# Patient Record
Sex: Male | Born: 1955 | Race: White | Hispanic: No | Marital: Married | Smoking: Never smoker
Health system: Southern US, Community
[De-identification: ages and names within clinical notes are randomized; demographics above are authoritative.]

## PROBLEM LIST (undated history)

## (undated) DIAGNOSIS — E781 Pure hyperglyceridemia: Secondary | ICD-10-CM

## (undated) DIAGNOSIS — Z8614 Personal history of Methicillin resistant Staphylococcus aureus infection: Secondary | ICD-10-CM

## (undated) DIAGNOSIS — D1801 Hemangioma of skin and subcutaneous tissue: Secondary | ICD-10-CM

## (undated) DIAGNOSIS — G459 Transient cerebral ischemic attack, unspecified: Secondary | ICD-10-CM

## (undated) DIAGNOSIS — C4491 Basal cell carcinoma of skin, unspecified: Secondary | ICD-10-CM

## (undated) DIAGNOSIS — M539 Dorsopathy, unspecified: Secondary | ICD-10-CM

## (undated) DIAGNOSIS — I10 Essential (primary) hypertension: Secondary | ICD-10-CM

## (undated) DIAGNOSIS — Z0389 Encounter for observation for other suspected diseases and conditions ruled out: Secondary | ICD-10-CM

## (undated) DIAGNOSIS — E785 Hyperlipidemia, unspecified: Secondary | ICD-10-CM

## (undated) HISTORY — DX: Hemangioma of skin and subcutaneous tissue: D18.01

## (undated) HISTORY — DX: Hyperlipidemia, unspecified: E78.5

## (undated) HISTORY — DX: Encounter for observation for other suspected diseases and conditions ruled out: Z03.89

## (undated) HISTORY — DX: Pure hyperglyceridemia: E78.1

## (undated) HISTORY — PX: BACK SURGERY: SHX140

## (undated) HISTORY — DX: Basal cell carcinoma of skin, unspecified: C44.91

## (undated) HISTORY — PX: HAND SURGERY: SHX662

## (undated) HISTORY — DX: Personal history of Methicillin resistant Staphylococcus aureus infection: Z86.14

---

## 2002-06-22 ENCOUNTER — Encounter: Payer: Self-pay | Admitting: *Deleted

## 2002-06-22 ENCOUNTER — Encounter: Payer: Self-pay | Admitting: Orthopedic Surgery

## 2002-06-22 ENCOUNTER — Emergency Department (HOSPITAL_COMMUNITY): Admission: EM | Admit: 2002-06-22 | Discharge: 2002-06-22 | Payer: Self-pay | Admitting: Emergency Medicine

## 2006-12-16 ENCOUNTER — Ambulatory Visit: Payer: Self-pay | Admitting: Cardiology

## 2007-01-07 ENCOUNTER — Ambulatory Visit (HOSPITAL_COMMUNITY): Admission: RE | Admit: 2007-01-07 | Discharge: 2007-01-07 | Payer: Self-pay | Admitting: Cardiovascular Disease

## 2007-01-07 ENCOUNTER — Ambulatory Visit: Payer: Self-pay | Admitting: Cardiovascular Disease

## 2007-01-07 IMAGING — CT CT HEART WO/W CTA ONLY W/ CA
1 of 5 series · 13 of 20 positions shown, 17 images · non-contrast
Comparison: none

Addendum BeginsOriginal report by Dr. DIZZLE.  Following addendum by Dr. DIZZLE on [DATE]: 
 OVER-READ INTERPRETATION - CHEST CT:
 The following report provided by [REDACTED] is an over-read interpretation of the non-cardiac portion of this coronary CTA study.  This does not include interpretation of cardiac or coronary anatomy or pathology.
CLINICAL DATA: 50-year-old patient with strong family history for coronary disease.  Previous atypical chest pain. 
 CARDIAC CTA:
PROTOCOL: The patient was scanned on the Siemens Sensation 64-slice scanner.  Gantry rotation speed was 330 milliseconds.  Collimation was [DATE] mm with a reconstruction overlap of 0.4 mm.  5 mg of IV Lopressor was given as well as sublingual nitroglycerin.  Average heart rate during the scan was 54 BPM.  After AP and lateral topograms, the patient had 3-mm noncontrast calcium scoring performed axially.  Score was calculated using the Agatston method.  20 cc of contrast were then given for timing bolus with the region of interest in the ascending aorta.  80 cc of contrast were then given for coronary CTA.  3D data set was sent to a [HOSPITAL] workstation for imaging using MPR, MIP, and VRT modes.

[Series 11: 70% only · axial · 0.45mm/px · z∈[-282,-161]mm · 13 of 354 slices shown, 17 images]
[im 26/354  vessel]
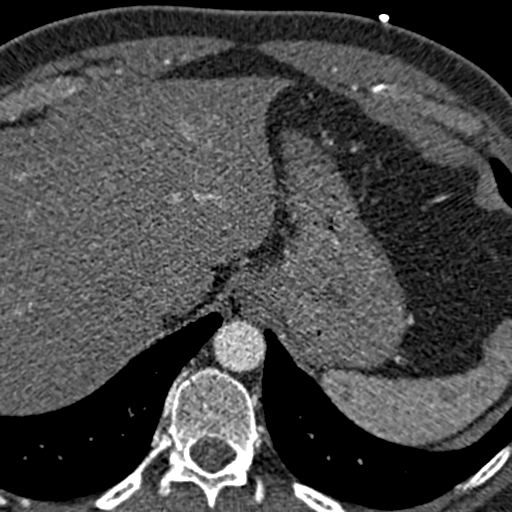
[im 26/354  lung]
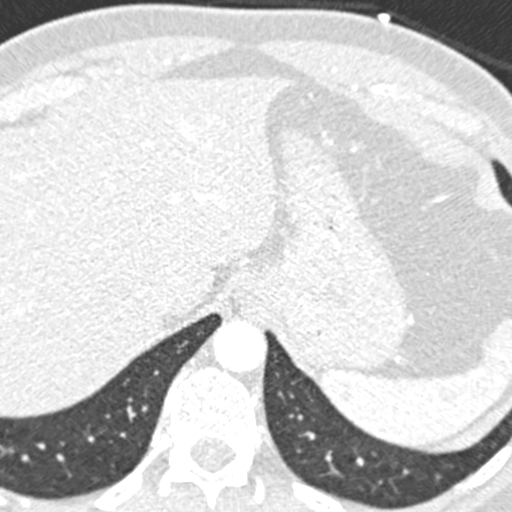
[im 51/354  vessel]
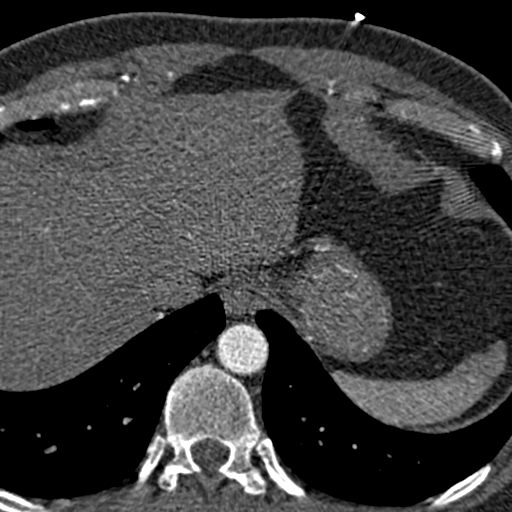
[im 76/354  vessel]
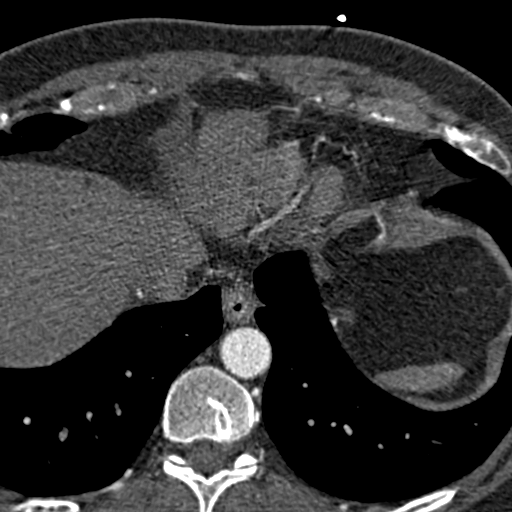
[im 101/354  vessel]
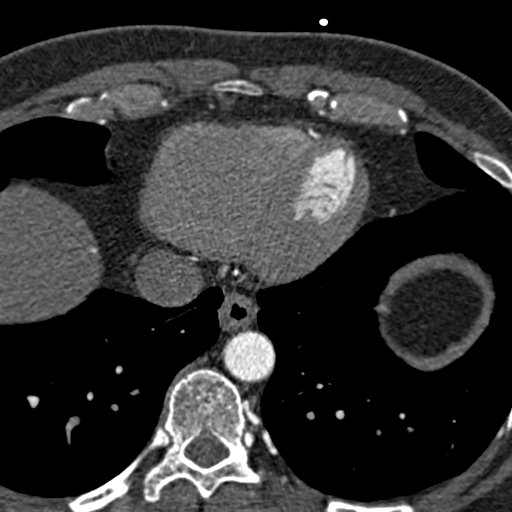
[im 127/354  vessel]
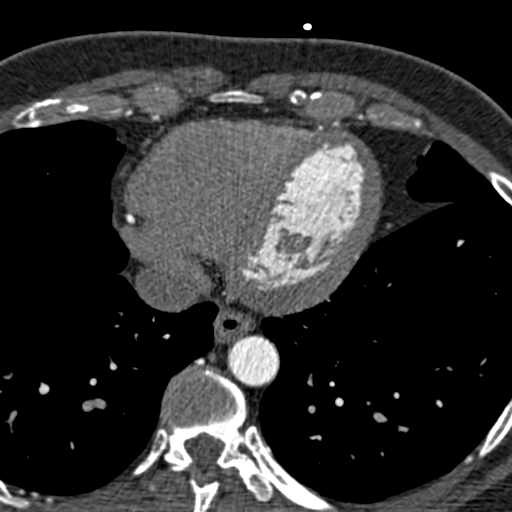
[im 127/354  lung]
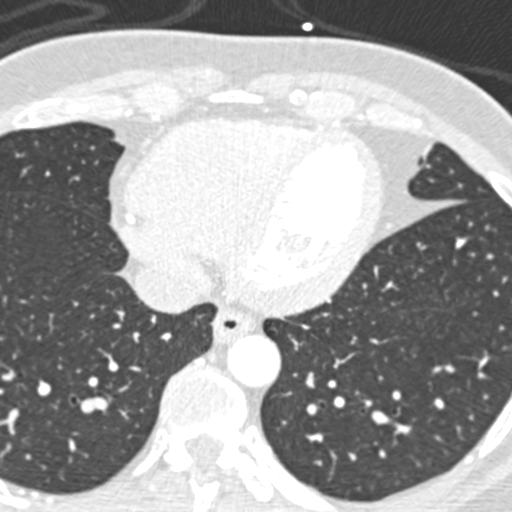
[im 152/354  vessel]
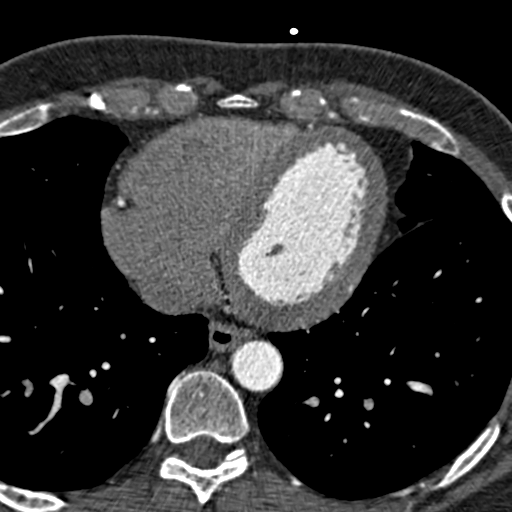
[im 177/354  vessel]
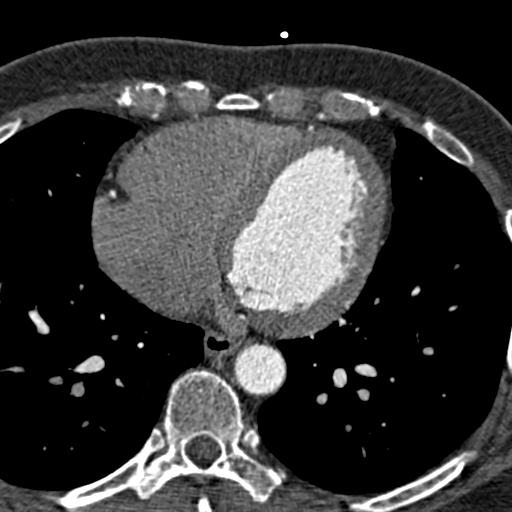
[im 202/354  vessel]
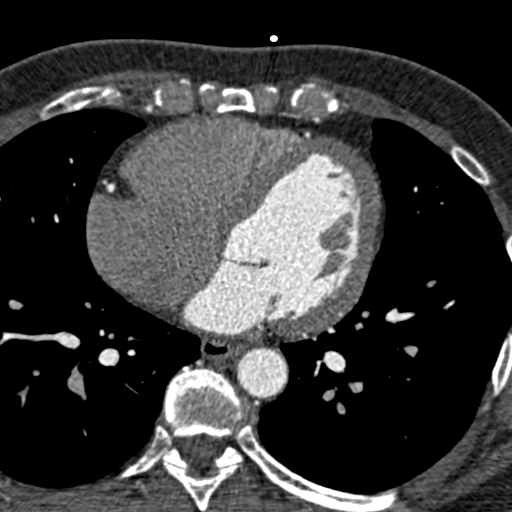
[im 227/354  vessel]
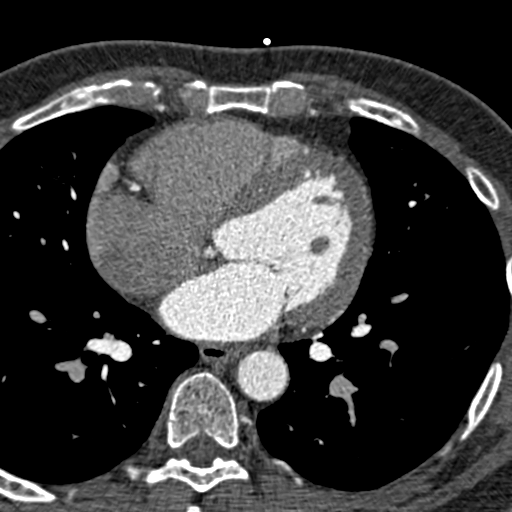
[im 227/354  lung]
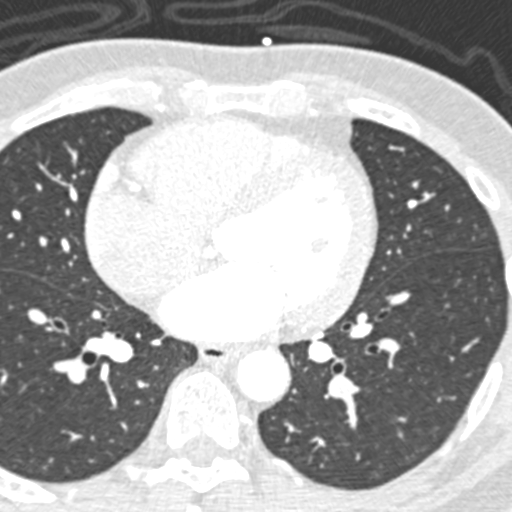
[im 253/354  vessel]
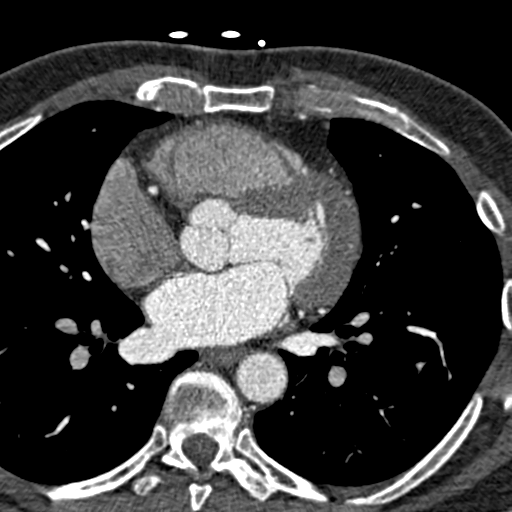
[im 278/354  vessel]
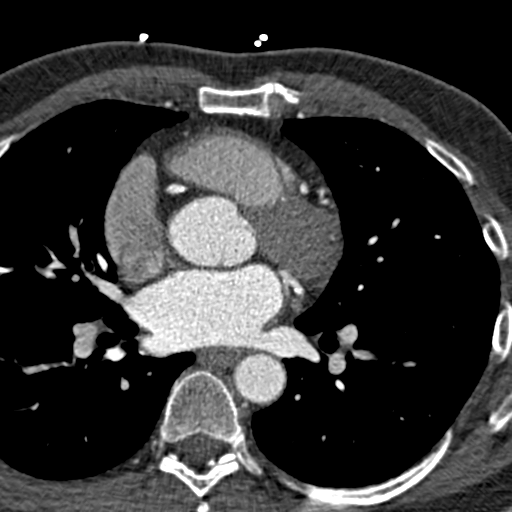
[im 303/354  vessel]
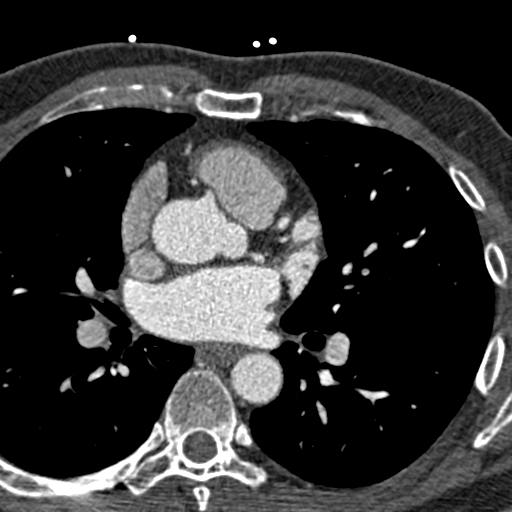
[im 328/354  vessel]
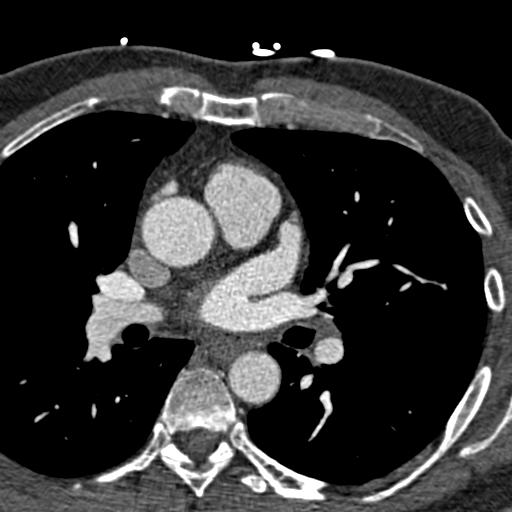
[im 328/354  lung]
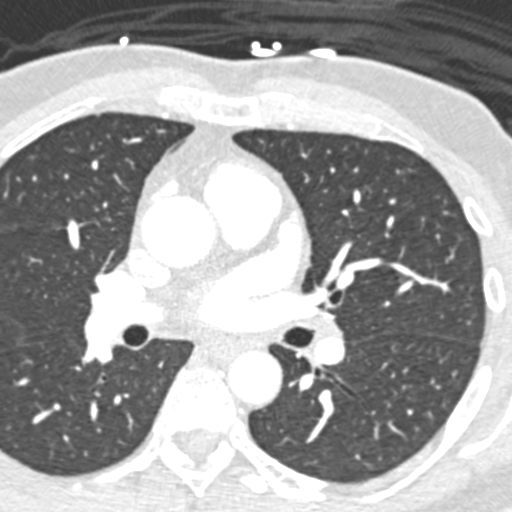

[13 of 20 positions shown; findings below may reference images not displayed]

FINDINGS: There is no lymphadenopathy within the visualized portions of the chest.  The visualized lungs are unremarkable.
IMPRESSION: No acute or significant findings in the background visualized anatomy of the chest. 

 Addendum Ends
FINDINGS: Bone, soft tissue, and lung windows were reviewed.  These will also be looked at by Dr. DIZZLE from [REDACTED].  See separate dictation.  There were no obvious abnormalities. 
 NONCORONARY CARDIAC FINDINGS:  All four cardiac chambers were normal in size.  There was no ASD or VSD.  There was no pericardial effusion.  The aortic valve was trileaflet and structurally normal.  Mitral and tricuspid valves appeared structurally normal.  Left ventricular cavity size and function were normal.  The quantitative ejection fraction was 67%.  There were no regional wall motion abnormalities. 
 CORONARY CTA:  The patient is right coronary artery dominant.  There were no congenital anomalies.  
 The left main coronary artery was normal and arose from the left cusp.  There was a small area of calcification superiorly but no stenoses.  The left anterior descending artery had moderate calcification in the mid vessel, less than 50% calcific stenosis in the mid LAD.  The first diagonal branch was small without significant disease.  The second diagonal branch was large and bifurcating without significant disease.  The distal LAD was normal. 
 The circumflex coronary artery had a small amount of calcification in the mid vessel. There was no significant stenosis.  The first obtuse marginal branch was large and branching without significant disease.  The second obtuse marginal branch was without disease.  The distal aspect of the circ was poorly visualized. 
 The right coronary artery was dominant.  There was a conus branch and one small RV branch seen.  There was moderate calcification in the proximal and mid RCA.  There was less than 30% calcific disease in the proximal and mid RCA.  Distal RCA was normal.  There was a PDA and two small posterolateral branches, which were normal. 
 The patient's calcium score was equal to 194.  The majority of the calcium was in the mid LAD as well as the proximal and mid right.
IMPRESSION: 1.  Calcium score of 194. 
 2.  Normal left ventricle with EF of 67%. 
 3.  No critical coronary artery disease but definite evidence of coronary atherosclerosis.  The patient has less than 50% calcific lesion in the mid LAD and less than 30% calcific disease in the proximal and mid right. 
 4.  No significant noncardiac findings. 
 5.  Given the patient's strong family history and evidence for coronary calcification, aggressive risk factor modification is indicated and probable follow-up perfusion study in two years. 
 Study to be billed by [facility] as [3P] and [3P] T-codes.

## 2007-03-29 ENCOUNTER — Ambulatory Visit: Payer: Self-pay | Admitting: Cardiology

## 2007-11-17 ENCOUNTER — Ambulatory Visit: Payer: Self-pay | Admitting: Cardiology

## 2008-11-14 ENCOUNTER — Ambulatory Visit: Payer: Self-pay | Admitting: Cardiology

## 2008-12-12 ENCOUNTER — Encounter: Payer: Self-pay | Admitting: Cardiology

## 2009-02-26 DIAGNOSIS — I2510 Atherosclerotic heart disease of native coronary artery without angina pectoris: Secondary | ICD-10-CM | POA: Insufficient documentation

## 2009-02-26 DIAGNOSIS — I10 Essential (primary) hypertension: Secondary | ICD-10-CM | POA: Insufficient documentation

## 2009-06-28 ENCOUNTER — Encounter: Payer: Self-pay | Admitting: Cardiology

## 2009-07-08 ENCOUNTER — Encounter: Payer: Self-pay | Admitting: Infectious Diseases

## 2009-07-08 DIAGNOSIS — L02519 Cutaneous abscess of unspecified hand: Secondary | ICD-10-CM | POA: Insufficient documentation

## 2009-07-08 DIAGNOSIS — L03119 Cellulitis of unspecified part of limb: Secondary | ICD-10-CM | POA: Insufficient documentation

## 2009-07-12 ENCOUNTER — Encounter (INDEPENDENT_AMBULATORY_CARE_PROVIDER_SITE_OTHER): Payer: Self-pay | Admitting: *Deleted

## 2009-07-12 DIAGNOSIS — L02419 Cutaneous abscess of limb, unspecified: Secondary | ICD-10-CM | POA: Insufficient documentation

## 2009-07-12 DIAGNOSIS — M26609 Unspecified temporomandibular joint disorder, unspecified side: Secondary | ICD-10-CM | POA: Insufficient documentation

## 2009-07-12 DIAGNOSIS — J309 Allergic rhinitis, unspecified: Secondary | ICD-10-CM | POA: Insufficient documentation

## 2009-07-12 DIAGNOSIS — L03119 Cellulitis of unspecified part of limb: Secondary | ICD-10-CM | POA: Insufficient documentation

## 2009-07-12 DIAGNOSIS — N419 Inflammatory disease of prostate, unspecified: Secondary | ICD-10-CM | POA: Insufficient documentation

## 2009-07-12 DIAGNOSIS — J45901 Unspecified asthma with (acute) exacerbation: Secondary | ICD-10-CM | POA: Insufficient documentation

## 2009-07-18 ENCOUNTER — Ambulatory Visit: Payer: Self-pay | Admitting: Infectious Diseases

## 2009-07-18 DIAGNOSIS — A4902 Methicillin resistant Staphylococcus aureus infection, unspecified site: Secondary | ICD-10-CM | POA: Insufficient documentation

## 2009-08-23 ENCOUNTER — Encounter: Payer: Self-pay | Admitting: Cardiology

## 2009-08-28 ENCOUNTER — Encounter: Payer: Self-pay | Admitting: Cardiology

## 2009-08-28 ENCOUNTER — Encounter: Payer: Self-pay | Admitting: Infectious Diseases

## 2009-09-19 ENCOUNTER — Ambulatory Visit: Payer: Self-pay | Admitting: Infectious Diseases

## 2010-03-12 ENCOUNTER — Encounter: Payer: Self-pay | Admitting: Infectious Diseases

## 2010-03-12 ENCOUNTER — Encounter: Payer: Self-pay | Admitting: Cardiology

## 2010-04-15 ENCOUNTER — Ambulatory Visit: Payer: Self-pay | Admitting: Cardiology

## 2010-04-15 ENCOUNTER — Encounter (INDEPENDENT_AMBULATORY_CARE_PROVIDER_SITE_OTHER): Payer: Self-pay | Admitting: *Deleted

## 2010-05-07 ENCOUNTER — Ambulatory Visit: Payer: Self-pay | Admitting: Cardiology

## 2010-05-12 ENCOUNTER — Telehealth: Payer: Self-pay | Admitting: Cardiology

## 2010-07-10 NOTE — Assessment & Plan Note (Signed)
Summary: 1 YR FU PER JUNE REMINDER-SRS   Visit Type:  Follow-up Primary Provider:  Sherril Croon   History of Present Illness: the patient is a 55 year old male with no prior history of significant coronary artery disease. Had a cardiac CT scan done several years ago which showed mild plaquing. There were no obstructive lesions. The patient is treated with statins e. His recent labs showed cholesterol 156 triglycerides of 146 LDL of 81 and HDL of 46. The patient still working out weights and cardio 5 times a week. He occasional has chest pressure but this is very short-lived and is nonexertional. The patient's blood pressure did have slightly increased and is requesting to increase his hydrochlorothiazide.  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  From a cardiovascular standpoint is otherwise stable and asymptomatic. The patient is requesting a screening stress test.  Preventive Screening-Counseling & Management  Alcohol-Tobacco     Smoking Status: never  Current Medications (verified): 1)  Aspir-Low 81 Mg Tbec (Aspirin) .... Take 1 Tablet By Mouth Once A Day 2)  Hydrochlorothiazide 25 Mg Tabs (Hydrochlorothiazide) .... Take 1/2 Tab (12.5mg ) Daily 3)  Lipitor 80 Mg Tabs (Atorvastatin Calcium) .... Take 1/2 Tablet By Mouth Once A Day 4)  Lotrel 5-10 Mg Caps (Amlodipine Besy-Benazepril Hcl) .... Take 1 Tablet By Mouth Once A Day 5)  Multivitamins  Tabs (Multiple Vitamin) .... Take 1 Tablet By Mouth Once A Day 6)  Tenormin 50 Mg Tabs (Atenolol) .... Take 1/4 Tablet By Mouth Once A Day 7)  Zetia 10 Mg Tabs (Ezetimibe) .... Take 1 Tablet By Mouth Once A Day 8)  Jalyn 0.5-0.4 Mg Caps (Dutasteride-Tamsulosin Hcl) .... Take 1 Tablet By Mouth Once A Day  Allergies (verified): 1)  ! Sudafed (Pseudoephedrine Hcl) 2)  ! * Antihistamines 3)  ! Biaxin  Comments:  Nurse/Medical Assistant: The patient's  medication list and allergies were reviewed with the patient and were updated in the Medication and Allergy Lists.  Past History:  Past Medical History: Last updated: 02/26/2009 HYPERTENSION, UNSPECIFIED (ICD-401.9) CAD, NATIVE VESSEL (ICD-414.01)    Past Surgical History: Last updated: 02/26/2009 Back Surgery  Family History: Last updated: 02/26/2009 Heart disease  Social History: Last updated: 02/26/2009 Full Time Married  Tobacco Use - No.  Alcohol Use - yes Regular Exercise - yes Drug Use - no  Risk Factors: Alcohol Use: 1 (09/19/2009) Caffeine Use: sodas (09/19/2009) Exercise: yes (09/19/2009)  Risk Factors: Smoking Status: never (04/15/2010)  Review of Systems  The patient denies fatigue, malaise, fever, weight gain/loss, vision loss, decreased hearing, hoarseness, chest pain, palpitations, shortness of breath, prolonged cough, wheezing, sleep apnea, coughing up blood, abdominal pain, blood in stool, nausea, vomiting, diarrhea, heartburn, incontinence, blood in urine, muscle weakness, joint pain, leg swelling, rash, skin lesions, headache, fainting, dizziness, depression, anxiety, enlarged lymph nodes, easy bruising or bleeding, and environmental allergies.    Vital Signs:  Patient profile:   55 year old male Height:      70 inches Weight:      177 pounds Pulse rate:   64 / minute BP sitting:   138 / 84  (left arm) Cuff size:   regular  Vitals Entered By: Carlye Grippe (April 15, 2010 8:41 AM)  Physical Exam  Additional Exam:  General: Well-developed, well-nourished in no distress head: Normocephalic and atraumatic eyes PERRLA/EOMI intact, conjunctiva and lids normal nose: No deformity or lesions mouth normal dentition, normal posterior  pharynx neck: Supple, no JVD.  No masses, thyromegaly or abnormal cervical nodes lungs: Normal breath sounds bilaterally without wheezing.  Normal percussion heart: regular rate and rhythm with normal S1 and S2, no  S3 or S4.  PMI is normal.  No pathological murmurs abdomen: Normal bowel sounds, abdomen is soft and nontender without masses, organomegaly or hernias noted.  No hepatosplenomegaly musculoskeletal: Back normal, normal gait muscle strength and tone normal pulsus: Pulse is normal in all 4 extremities Extremities: No peripheral pitting edema neurologic: Alert and oriented x 3 skin: Intact without lesions or rashes cervical nodes: No significant adenopathy psychologic: Normal affect   EKG  Procedure date:  04/15/2010  Findings:      sinus bradycardia.LVH. Heart rate 59 beats per minute.  Impression & Recommendations:  Problem # 1:  SCREENING OTHER&UNSPEC CARDIOVASCULAR CONDITIONS (ICD-V81.2) the patient has a strong family history of coronary artery disease and has some coronary plaquing by prior CT scan. We will schedule him for a stress echocardiogram. 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  Problem # 3:  HYPERTENSION, UNSPECIFIED (ICD-401.9) blood pressure is somewhat increased. We will increase his hydrochlorothiazide to 12.5 glucose p.o. q. daily. His updated medication list for this problem includes:    Aspir-low 81 Mg Tbec (Aspirin) .Marland Kitchen... Take 1 tablet by mouth once a day    Hydrochlorothiazide 25 Mg Tabs (Hydrochlorothiazide) .Marland Kitchen... Take 1/2 tab (12.5mg ) daily    Lotrel 5-10 Mg Caps (Amlodipine besy-benazepril hcl) .Marland Kitchen... Take 1 tablet by mouth once a day    Tenormin 50 Mg Tabs (Atenolol) .Marland Kitchen... Take 1/4 tablet by mouth once a day  Other Orders: EKG w/ Interpretation (93000)  Patient Instructions: 1)  Stress Echo 2)  Increase HCTZ to 12.5mg  daily 3)  Follow up in  1 year  Prescriptions: HYDROCHLOROTHIAZIDE 25 MG TABS (HYDROCHLOROTHIAZIDE) take 1/2 tab (12.5mg ) daily  #15 x 6   Entered by:   Hoover Brunette, LPN   Authorized by:   Lewayne Bunting, MD, [facility]   Signed by:    Hoover Brunette, LPN on 81/19/1478   Method used:   Electronically to        [facility]* (retail)       [address]       [place], [place]  [address]       Ph: [phone]       Fax: [phone]   RxID:   [id number]

## 2010-07-10 NOTE — Consult Note (Signed)
Summary: [facility]  [facility]   Imported By: Florinda Marker 03/31/2010 12:18:05  _____________________________________________________________________  External Attachment:    Type:   Image     Comment:   External Document

## 2010-07-10 NOTE — Progress Notes (Signed)
Summary: PHONE:  TEST RESULTS  Phone Note Call from Patient Call back at Home Phone [phone] Call back at cell:  [phone]   Caller: Patient Call For: DR. Andee Lineman Reason for Call: Lab or Test Results Summary of Call: [name] [name] called today requesting to his test results.  I spoke with [name] and she sugguested sending you a flag on this phone call. Thanks. Initial call taken by: Zachary George,  May 12, 2010 11:34 AM  Follow-up for Phone Call        completely normal stress echo please notify patient Follow-up by: Lewayne Bunting, MD, [facility],  May 14, 2010 1:56 PM     Appended Document: PHONE:  TEST RESULTS see test results

## 2010-07-10 NOTE — Miscellaneous (Signed)
Summary: rx   Clinical Lists Changes  Medications: Changed medication from HYDROCHLOROTHIAZIDE 25 MG TABS (HYDROCHLOROTHIAZIDE) Take 1/2 tablet by mouth every other day to HYDROCHLOROTHIAZIDE 12.5 MG TABS (HYDROCHLOROTHIAZIDE) Take one tablet by mouth daily. - Signed Changed medication from LIPITOR 80 MG TABS (ATORVASTATIN CALCIUM) Take 1 tablet by mouth once a day to LIPITOR 40 MG TABS (ATORVASTATIN CALCIUM) Take 1 tablet by mouth once a day - Signed Changed medication from ZETIA 10 MG TABS (EZETIMIBE) Take 1 tablet by mouth once a day to ZETIA 10 MG TABS (EZETIMIBE) Take 1 tablet by mouth once a day - Signed Changed medication from LOTREL 5-10 MG CAPS (AMLODIPINE BESY-BENAZEPRIL HCL) Take 1 capsule by mouth once a day to LOTREL 5-10 MG CAPS (AMLODIPINE BESY-BENAZEPRIL HCL) Take 1 tablet by mouth once a day - Signed Changed medication from TENORMIN 50 MG TABS (ATENOLOL) Take 1 tablet by mouth once a day to TENORMIN 50 MG TABS (ATENOLOL) Take 1 tablet by mouth once a day - Signed Rx of HYDROCHLOROTHIAZIDE 12.5 MG TABS (HYDROCHLOROTHIAZIDE) Take one tablet by mouth daily.;  #90 x 3;  Signed;  Entered by: Hoover Brunette, LPN;  Authorized by: Lewayne Bunting, MD, [facility];  Method used: Print then Give to Patient Rx of LIPITOR 40 MG TABS (ATORVASTATIN CALCIUM) Take 1 tablet by mouth once a day;  #90 x 3;  Signed;  Entered by: Hoover Brunette, LPN;  Authorized by: Lewayne Bunting, MD, [facility];  Method used: Print then Give to Patient Rx of ZETIA 10 MG TABS (EZETIMIBE) Take 1 tablet by mouth once a day;  #90 x 3;  Signed;  Entered by: Hoover Brunette, LPN;  Authorized by: Lewayne Bunting, MD, [facility];  Method used: Print then Give to Patient Rx of LOTREL 5-10 MG CAPS (AMLODIPINE BESY-BENAZEPRIL HCL) Take 1 tablet by mouth once a day;  #90 x 3;  Signed;  Entered by: Hoover Brunette, LPN;  Authorized by: Lewayne Bunting, MD, [facility];  Method used: Print then Give to Patient Rx of TENORMIN 50 MG TABS (ATENOLOL) Take 1 tablet by mouth once a day;  #90 x 3;   Signed;  Entered by: Hoover Brunette, LPN;  Authorized by: Lewayne Bunting, MD, [facility];  Method used: Print then Give to Patient    Prescriptions: TENORMIN 50 MG TABS (ATENOLOL) Take 1 tablet by mouth once a day  #90 x 3   Entered by:   Hoover Brunette, LPN   Authorized by:   Lewayne Bunting, MD, [facility]   Signed by:   Hoover Brunette, LPN on 16/03/9603   Method used:   Print then Give to Patient   RxID:   [id number] LOTREL 5-10 MG CAPS (AMLODIPINE BESY-BENAZEPRIL HCL) Take 1 tablet by mouth once a day  #90 x 3   Entered by:   Hoover Brunette, LPN   Authorized by:   Lewayne Bunting, MD, [facility]   Signed by:   Hoover Brunette, LPN on 21/30/8657   Method used:   Print then Give to Patient   RxID:   [id number] ZETIA 10 MG TABS (EZETIMIBE) Take 1 tablet by mouth once a day  #90 x 3   Entered by:   Hoover Brunette, LPN   Authorized by:   Lewayne Bunting, MD, [facility]   Signed by:   Hoover Brunette, LPN on 06/10/7251   Method used:   Print then Give to Patient   RxID:   [id number] LIPITOR 40 MG TABS (ATORVASTATIN CALCIUM) Take 1 tablet by mouth once a day  #90 x  3   Entered by:   Hoover Brunette, LPN   Authorized by:   Lewayne Bunting, MD, [facility]   Signed by:   Hoover Brunette, LPN on 16/03/9603   Method used:   Print then Give to Patient   RxID:   [id number] HYDROCHLOROTHIAZIDE 12.5 MG TABS (HYDROCHLOROTHIAZIDE) Take one tablet by mouth daily.  #90 x 3   Entered by:   Hoover Brunette, LPN   Authorized by:   Lewayne Bunting, MD, [facility]   Signed by:   Hoover Brunette, LPN on 21/30/8657   Method used:   Print then Give to Patient   RxID:   [id number]
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
## 2010-07-10 NOTE — Miscellaneous (Signed)
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) - Signed Added new problem of UNSPECIFIED PROSTATITIS (ICD-601.9) - Signed Added new problem of CELLULITIS/ABSCESS, LEG (ICD-682.6) - Signed Added new problem of CELLULITIS/ABSCESS, HAND (ICD-682.4) - left thumb - Signed Medications: Added new medication of CLINDAMYCIN HCL 150 MG CAPS (CLINDAMYCIN HCL) Take 2 capsules by mouth four times a day per PCP Allergies: Added new allergy or adverse reaction of SUDAFED (PSEUDOEPHEDRINE HCL) - Signed Added new allergy or adverse reaction of * ANTIHISTAMINES - Signed Added new allergy or adverse reaction of BIAXIN - Signed Observations: Added new observation of NKA: F (07/12/2009 10:17)

## 2010-07-10 NOTE — Miscellaneous (Signed)
Summary: HIPAA Restrictions  HIPAA Restrictions   Imported By: Florinda Marker 07/18/2009 15:00:10  _____________________________________________________________________  External Attachment:    Type:   Image     Comment:   External Document

## 2010-07-10 NOTE — Consult Note (Signed)
Summary: [facility]  [facility]   Imported By: Florinda Marker 09/17/2009 14:20:46  _____________________________________________________________________  External Attachment:    Type:   Image     Comment:   External Document

## 2010-07-10 NOTE — Letter (Signed)
Summary: External Correspondence/ [facility]  External Correspondence/ [facility]   Imported By: Dorise Hiss 08/29/2009 12:32:20  _____________________________________________________________________  External Attachment:    Type:   Image     Comment:   External Document

## 2010-07-10 NOTE — Letter (Signed)
Summary: Stress Echocardiography  [facility] at [facility] [address]   [place], [place] [address]   Phone: [phone]  Fax: [phone]      [facility] Cardiovascular Services  Stress Echocardiography    [facility]  Appointment Date:_  Appointment Time:_   Your doctor has ordered a stress echo to help determine the condition of your heart during exercise. If you take blood pressure medication, ask your doctor if you should take it the day of your test. You should not have anything to eat or drink at least 4 hours before your test is scheduled.  You will be asked to undress from the waist up and given a hospital gown to wear, so dress comfortably from the waist down for example: Sweat pants, shorts, or skirt Rubber soled lace up shoes (tennis shoes)  You will need to register at the Outpatient/Main Entrance at the hospital 15 minutes before your appointment time. It is a good idea to bring a copy of your order with you. They will direct you to the Cardiovascular Department on the third floor.   Plan on about an hour and a half  from registration to release from the hospital

## 2010-07-10 NOTE — Consult Note (Signed)
Summary: [facility] Internal Medicine  [facility] Internal Medicine   Imported By: Florinda Marker 08/13/2009 16:15:17  _____________________________________________________________________  External Attachment:    Type:   Image     Comment:   External Document

## 2010-07-10 NOTE — Miscellaneous (Signed)
Summary: rx - zetia  Clinical Lists Changes  Medications: Rx of ZETIA 10 MG TABS (EZETIMIBE) Take 1 tablet by mouth once a day;  #30 x 6;  Signed;  Entered by: Hoover Brunette, LPN;  Authorized by: Lewayne Bunting, MD, [facility];  Method used: Electronically to [facility] [address], [place], [place], [place]  [address], Ph: [phone], Fax: [phone]    Prescriptions: ZETIA 10 MG TABS (EZETIMIBE) Take 1 tablet by mouth once a day  #30 x 6   Entered by:   Hoover Brunette, LPN   Authorized by:   Lewayne Bunting, MD, [facility]   Signed by:   Hoover Brunette, LPN on 13/01/6577   Method used:   Electronically to        [facility]* (retail)       [address]       [place], [place]  [address]       Ph: [phone]       Fax: [phone]   RxID:   [id number]

## 2010-07-10 NOTE — Assessment & Plan Note (Signed)
Summary: new pt abscess on rt groin/   Primary Provider:  Vyas  CC:  new patient abscess on right groin.  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  Treated again with bactrim in late oct.  Nov 26th got another one on R arm.  placed again on bactrim two times a day for 2 wks and tehn one by mouth once daily for another 4 wks.  Also using bactroban in nose.  Also using hibiclens daily.  Cleared up pretty well and was back to normal and went skiing.  Again on the 29th of Jan he developed a new lesion on thumb and had to have it lanced. Was started on clindamycin 300 three times a day for 10 days and it is finished now.    No family members have boils.  Does go to gym alot.  Uses gym gloves.     Preventive Screening-Counseling & Management  Alcohol-Tobacco     Alcohol drinks/day: 1     Alcohol type: wine     Smoking Status: never  Caffeine-Diet-Exercise     Caffeine use/day: sodas     Does Patient Exercise: yes     Type of exercise[facility] [employer]     Exercise (avg: min/session): 30-60     Times/week: 5  Safety-Violence-Falls     Seat Belt Use: yes   Updated Prior Medication List: ASPIRIN 325 MG TABS (ASPIRIN) Take 1 tablet by mouth once a day HYDROCHLOROTHIAZIDE 12.5 MG TABS (HYDROCHLOROTHIAZIDE) Take one tablet by mouth daily. LIPITOR 40 MG TABS (ATORVASTATIN CALCIUM) Take 1 tablet by mouth once a day LOTREL 5-10 MG CAPS (AMLODIPINE BESY-BENAZEPRIL HCL) Take 1 tablet by mouth once a day MULTIVITAMINS  TABS (MULTIPLE VITAMIN) Take 1 tablet by mouth once a day TENORMIN 50 MG TABS (ATENOLOL) Take 1 tablet by mouth once a day ZETIA 10 MG TABS (EZETIMIBE) Take 1 tablet by mouth once a day CLINDAMYCIN HCL 150 MG CAPS (CLINDAMYCIN HCL) Take 2 capsules by mouth four times a day per PCP  Current Allergies (reviewed today): ! SUDAFED  (PSEUDOEPHEDRINE HCL) ! * ANTIHISTAMINES ! 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  Alcohol Use - yes Regular Exercise - yes Drug Use - no  Risk Factors: Alcohol Use: 1 (07/18/2009) Caffeine Use: sodas (07/18/2009) Exercise: yes (07/18/2009)  Risk Factors: Smoking Status: never (07/18/2009)  Review of Systems       11 systems reviewed and negative except per HPI   Vital Signs:  Patient profile:   55 year old male Height:      70 inches (177.80 cm) Weight:      178.4 pounds (81.09 kg) BMI:     25.69 Temp:     96.7 degrees F (35.94 degrees C) oral Pulse rate:   65 / minute BP sitting:   131 / 82  (right arm)  Vitals Entered By: Baxter Hire) (July 18, 2009 11:24 AM) CC: new patient abscess on right groin Is Patient Diabetic? No Pain Assessment Patient in pain? no      Nutritional Status BMI of 25 - 29 = overweight Nutritional Status  Detail appetite is wonderful per patient  Does patient need assistance? Functional Status Self care Ambulation Normal   Physical Exam  General:  alert, well-developed, and well-nourished.   Head:  normocephalic.   Eyes:  vision grossly intact, pupils equal, pupils round, and pupils reactive to light.   Mouth:  good dentition.   Neck:  supple.   Lungs:  normal respiratory effort and normal breath sounds.   Heart:  normal rate and regular rhythm.   Abdomen:  soft and non-tender.   Msk:  normal ROM and no joint swelling.   Skin:  thumb with mild area of desquamation and healing boil Cervical Nodes:  no anterior cervical adenopathy and no posterior cervical adenopathy.   Psych:  Oriented X3 and memory intact for  recent and remote.     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   Advised re  sharing towels, hygiene and need to present for I and D and antibiotics if lesions recur.  Will use doxy as suppression for now  Orders: Consultation Level IV (54098)  Problem # 2:  CELLULITIS/ABSCESS, HAND (ICD-682.4) Assessment: Improved  The following medications were removed from the medication list:    Clindamycin Hcl 150 Mg Caps (Clindamycin hcl) .Marland Kitchen... Take 2 capsules by mouth four times a day per pcp His updated medication list for this problem includes:    Aspirin 325 Mg Tabs (Aspirin) .Marland Kitchen... Take 1 tablet by mouth once a day    Doxycycline Hyclate 100 Mg Caps (Doxycycline hyclate) ..... One by mouth two times a day for 10 days and then once a day until follow up  Medications Added to Medication List This Visit: 1)  Doxycycline Hyclate 100 Mg Caps (Doxycycline hyclate) .... One by mouth two times a day for 10 days and then once a day until follow up 2)  Bactroban Nasal 2 % Oint (Mupirocin calcium) .... Apply to nares twice a day  Patient Instructions: 1)  Suppressive MRSA treatment -  Recommend to use bactroban nasal ointment to anterior  nares two times a day and hibiclens 4% shower lotion daily until you see me again. 2)  Take doxycycline 100 two times a day for 10 days then once a day. 3)  Advised re  sharing towels, hygiene and need to present for I and D and antibiotics if lesions recur. 4)    5)  Please schedule a follow-up appointment in 2 month. Prescriptions: BACTROBAN NASAL 2 % OINT (MUPIROCIN CALCIUM) apply to nares twice a day  #1 x 3   Entered and Authorized by:   Clydie Braun MD   Signed by:   Clydie Braun MD on 07/18/2009   Method used:    Electronically to        [facility]* (retail)       [address]       [place], [place]  [address]       Ph: [phone]       Fax: [phone]   RxID:   [id number] DOXYCYCLINE HYCLATE 100 MG CAPS (DOXYCYCLINE HYCLATE) one by mouth two times a day for 10 days and then once a day until follow up  #60 x 1   Entered and Authorized by:   Clydie Braun  MD   Signed by:   Clydie Braun MD on 07/18/2009   Method used:   Electronically to        [facility]* (retail)       [address]       [place], [place]  [address]       Ph: [phone]       Fax: [phone]   RxID:   [id number]

## 2010-07-10 NOTE — Letter (Signed)
Summary: External Correspondence/  [facility]  External Correspondence/  [facility]   Imported By: Dorise Hiss 03/13/2010 14:20:59  _____________________________________________________________________  External Attachment:    Type:   Image     Comment:   External Document

## 2010-08-13 ENCOUNTER — Ambulatory Visit (INDEPENDENT_AMBULATORY_CARE_PROVIDER_SITE_OTHER): Payer: BC Managed Care – PPO | Admitting: Infectious Diseases

## 2010-08-13 ENCOUNTER — Encounter: Payer: Self-pay | Admitting: Infectious Diseases

## 2010-08-13 DIAGNOSIS — A4902 Methicillin resistant Staphylococcus aureus infection, unspecified site: Secondary | ICD-10-CM

## 2010-08-19 NOTE — Assessment & Plan Note (Signed)
Summary: new to md/pt of dr.fitzgeralds/abscess   Vital Signs:  Patient profile:   55 year old male Height:      70 inches (177.80 cm) Weight:      180 pounds (81.82 kg) BMI:     25.92 Temp:     98.5 degrees F (36.94 degrees C) oral Pulse rate:   76 / minute BP sitting:   123 / 76  (left arm)  Vitals Entered By: Starleen Arms CMA (August 13, 2010 2:16 PM) CC: f/u  Is Patient Diabetic? No Pain Assessment Patient in pain? no      Nutritional Status BMI of 25 - 29 = overweight Nutritional Status Detail nl  Does patient need assistance? Functional Status Self care Ambulation Normal   Primary Provider:  Vyas  CC:  f/u .  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 Mar 17, 2009 had a upper L thigh groin abscess drained and  treated with doxy. Treated again with bactrim in late oct 2010.  May 03, 2009  R arm lesion.  placed again on bactrim two times a day for 2 wks and then one by mouth once daily for another 4 wks.  Also given bactroban in his nose.  Also using hibiclens daily. Resoved. On 29th of Jan 2011, he developed a new lesion on his L thumb and had to have it lanced. Was started on clindamycin 300 three times a day for 10 days and it is finished now.   Seen Feb 2011 and on treatment he has resolved his active boils and was treated with Doxy.  April 30, 2010 developed lesion on his R face. Was treated with cipro for bid 30 days, once daily for 30 days as well as mupirocin as rx by his PCP (Dr Iona Coach).  Had episodes of GI upset with last infection Currently uses hibiclens everytime he works out, clean clothes with each work out, using Medical laboratory scientific officer daily.  Today has multiple questions regarding continued infections.   Preventive Screening-Counseling & Management  Alcohol-Tobacco     Alcohol drinks/day: 1     Alcohol type: spirits     Smoking Status: never  Current Medications (verified): 1)  Aspir-Low 81 Mg Tbec (Aspirin) .... Take 1 Tablet By  Mouth Once A Day 2)  Hydrochlorothiazide 25 Mg Tabs (Hydrochlorothiazide) .... Take 1/2 Tab (12.5mg ) Daily 3)  Lipitor 80 Mg Tabs (Atorvastatin Calcium) .... Take 1/2 Tablet By Mouth Once A Day 4)  Lotrel 5-10 Mg Caps (Amlodipine Besy-Benazepril Hcl) .... Take 1 Tablet By Mouth Once A Day 5)  Multivitamins  Tabs (Multiple Vitamin) .... Take 1 Tablet By Mouth Once A Day 6)  Tenormin 50 Mg Tabs (Atenolol) .... Take 1/4 Tablet By Mouth Once A Day 7)  Zetia 10 Mg Tabs (Ezetimibe) .... Take 1/2 Tablet By Mouth Once A Day 8)  Jalyn 0.5-0.4 Mg Caps (Dutasteride-Tamsulosin Hcl) .... Take 1 Tablet By Mouth Once A Day  Allergies: 1)  ! Sudafed (Pseudoephedrine Hcl) 2)  ! * Antihistamines 3)  ! Biaxin  Review of Systems  The patient denies fever and weight loss.         no chills  Physical Exam  General:  well-developed, well-nourished, and well-hydrated.     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 He is doing well, is currently leions free. He is overall in excellent health. I suggested that he could change his  mupirocin use to the first 5 days of the month for the next 6 months. He could also change his mupirocin bathing to once weekly (as opposed to daily). Even these measures are perhaps a bit of overkill.  I encouraged him to call back if he has any further lesions and we will see him on an ASAP basis. I would recomend that he not recieve any further doses of Cipro as quinolones are unreliable for MRSA (would consider that he has actually been on Clinda).   Orders: Est. Patient Level III (62130)  Medications Added to Medication List This Visit: 1)  Zetia 10 Mg Tabs (Ezetimibe) .... Take 1/2 tablet by mouth once a day   Orders Added: 1)  Est. Patient Level III [86578]

## 2010-09-22 ENCOUNTER — Encounter (INDEPENDENT_AMBULATORY_CARE_PROVIDER_SITE_OTHER): Payer: Self-pay | Admitting: *Deleted

## 2010-09-22 ENCOUNTER — Encounter: Payer: Self-pay | Admitting: Cardiology

## 2010-10-21 NOTE — Assessment & Plan Note (Signed)
[facility]                          [facility] OFFICE NOTE   NAME:Netz, BOBAK OGUINN                        MRN:          [id number]  DATE:03/29/2007                            DOB:          [birth year]    HISTORY OF PRESENT ILLNESS:  The patient is a 55 year old male former  patient of Dr. Eliberto Ivory.  The patient has no prior history of coronary  artery disease.  However, he has a strong family history of coronary  artery disease and discussed with me the options for further  cardiovascular testing for risk stratification.  The patient had a  negative stress nuclear study in 2007.  On December 16, 2006 he visited me  for a second opinion, and was interested in proceeding with cardiac CT  scan.  I discussed with the patient the pros and cons of cardiac CT  testing.  He proceeded with the study which was performed by Dr. Eden Emms  on January 07, 2007.  All 4 cardiac chambers were normal in size.  There  was no pericardial effusion.  The patient had a calcium score of 194.  He had normal left ventricular function calculated at 67%.  The patient  had no critical coronary artery disease, but definite evidence of  coronary atherosclerosis.  The patient had a less than 50% calcific  lesion in the mid left anterior descending, and a less than 30% calcific  lesion in the proximal and mid right coronary artery.  The patient now  presents today to further discuss these results.  He has, otherwise,  remained asymptomatic.   MEDICATIONS:  1. Lipitor 40 mg p.o. nightly.  2. Zetia 10 mg p.o. daily.  3. Lotrel 5/10 mg p.o. daily.  4. Hydrochlorothiazide 25 mg 1/2 tablet p.o. daily.  5. Multivitamin.  6. Aspirin 325 daily.  7. Tenormin 12.5 mg p.o. daily.   PHYSICAL EXAMINATION:  VITAL SIGNS:  Blood pressure 137/86, heart rate  61 beats per minute, weight 181 pounds.  LUNGS:  Clear.  HEART:  Regular rate and rhythm.  EXTREMITY EXAM:  No edema.   PROBLEM LIST:  1.  Abnormal cardiac CT scan with details as outlined above.  2. Negative stress nuclear study in 2007.  3. Family history of coronary artery disease.  4. Hypertension.   PLAN:  1. I discussed with the patient the results of his cardiac CT scan.      Overall, they are very favorable.  He does not appear to have      significant flow limiting lesions.  I discussed with him the fact      that he does have coronary atherosclerosis, and needs to continue      to aggressively modify his risk factors.  2. The patient and I discussed possibly increasing his Lipitor to 80      mg p.o. daily, but he was not in favor of proceeding.  He was      concerned about possible side effects and associated myalgias.  3. I have made no change to patient's medical regimen, and the patient  will continue a further aggressive exercise program.     Learta Codding, MD,FACC  Electronically Signed    GED/MedQ  DD: 03/31/2007  DT: 03/31/2007  Job #: [id number]   cc:   Doreen Beam

## 2010-10-21 NOTE — Assessment & Plan Note (Signed)
[facility]                          [facility] OFFICE NOTE   NAME:[name] [name], [name] [name]                        MRN:          [id number]  DATE:11/17/2007                            DOB:          September 23, 1955    HISTORY OF PRESENT ILLNESS:  The patient is a 55 year old male, former  patient of Dr. Eliberto Ivory.  The patient is status post cardiac CT scan which  demonstrated very mild coronary artery disease with a 50% LAD lesion and  30% lesions in the right coronary artery to circumflex, appear to be  within normal limits.  The patient had normal ejection fraction.  The  patient is asymptomatic and is on an intensive exercise program.  His  lipid levels had previously been followed by Dr. Patty Sermons and Dr. Sherril Croon.  The patient's EKG in the office was within normal limits and he is  essentially asymptomatic.   MEDICATIONS:  Tenormin, Lipitor, Zetia, hydrochlorothiazide.   PHYSICAL EXAMINATION:  VITAL SIGNS:  Blood pressure is 138/85, heart  rate 75, weight 180 pounds.  NECK:  No carotid upstroke, no carotid bruits.  LUNGS:  Clear breath sounds bilaterally.  HEART:  Regular rate and rhythm.  Normal S1-S2, no murmurs, rubs or  gallops.  ABDOMEN:  Soft.  EXTREMITIES:  No cyanosis, clubbing or edema.   PROBLEM LIST:  1. Abnormal cardiac CT scan with details as outlined above.  2. Status post multiple negatives stress nuclear studies.  3. Family history of coronary artery disease.  4. Hypertension is controlled.   PLAN:  1. The patient will have a lipid panel done as well as liver function      test.  2. Will continue with current treatment plan.  3. The patient's blood pressure at home appears to be well controlled      and made no changes in his blood pressure.  4. Next year the patient can have a stress echocardiographic study      done and then thereafter every 3 years seems most appropriate.Learta Codding, MD,FACC  Electronically Signed    GED/MedQ  DD: 11/17/2007  DT: 11/17/2007  Job #: [id number]   cc:   Doreen Beam, MD

## 2010-10-21 NOTE — Assessment & Plan Note (Signed)
[facility]                          [facility] OFFICE NOTE   NAME:[name], [name] DOUTY                        MRN:          [id number]  DATE:12/16/2006                            DOB:          1956-05-12    REASON FOR CONSULTATION:  Second opinion regarding indication of cardiac  CT scan.   HISTORY OF PRESENT ILLNESS:  The patient is a 55 year old male former  patient of Dr. Eliberto [name].  The patient was previously followed by Dr.  Patty [name].  He has no prior history of significant coronary artery  disease, but has a strong family history.  At times, he has very  atypical chest pain, for which he had a stress test done in 2007.  The  study was within normal limits and the patient had good exercise  tolerance.  He also exercises very frequently, 5 days a week and twice a  week with a personal trainer, at which time he does interval training.  He reports no substernal chest pain, shortness of breath, orthopnea,  PND, palpitations, or syncope.  His cardiovascular history is otherwise  unremarkable.   ALLERGIES:  No allergies.   MEDICATIONS:  1. Lipitor 40 mg p.o. nightly.  2. Zetia 10 mg nightly.  3. Lotrel 5/10 daily.  4. Hydrochlorothiazide 25 half tablet daily.  5. Multivitamin.  6. Aspirin.  7. Tenormin 12.5 daily.   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   FAMILY HISTORY:  Notable for mother who is still alive.  Father had  heart disease at relatively young age.  The patient was initially sent  for surgery to [name], [place].  Father's heart problems reportedly  started at age 38.  Also grandparents had heart disease.   REVIEW OF SYSTEMS:  As per the HPI.  No nausea or vomiting.  No fevers  or chills.  No melena or hematochezia.  No dysuria or frequency.  No  palpitations or syncope.  The remaining review of systems is negative.   PHYSICAL EXAMINATION:  VITAL SIGNS:  Blood pressure 138/86, heart  rate  is 63 beats per minute, weight 176 pounds.  HEENT:  Normal.  NECK:  Normal carotid upstroke.  No carotid bruits.  LUNGS:  Clear breath sounds bilaterally.  HEART:  Regular rate and rhythm.  ABDOMEN:  Soft.  EXTREMITIES:  No cyanosis, clubbing, or edema.   A 12-lead electrocardiogram shows normal sinus rhythm.  Moderate voltage  criteria for LVH, but this may still be a normal variant.   PROBLEM LIST:  1. Atypical chest pain.  2. Negative stress nuclear study in 2007.  3. Family history of coronary artery disease.  4. Visit for second opinion regarding indications for cardiac CT scan.   PLAN:  1. I discussed with the patient the pros and cons of cardiac CT scan.      The patient is very concerned and would like to proceed with a      cardiac CT scan to further define his coronary anatomy and to rule      out  the possibility of premature coronary artery disease.  He      understands the poor positive predictive value of cardiac CT and is      willing to undergo cardiac catheterization if the test is positive.  2. The patient can continue on his current medical regimen, although I      told him that Tenormin is not necessarily indicated for      asymptomatic PVCs found at treadmill on testing.  3. The patient will follow up with me in 1 month to review his cardiac      CT scan.     Learta Codding, MD,FACC  Electronically Signed    GED/MedQ  DD: 12/16/2006  DT: 12/17/2006  Job #: [id number]

## 2010-10-21 NOTE — Assessment & Plan Note (Signed)
[facility]                          [facility] OFFICE NOTE   NAME:[name] [name], [name] [name]                        MRN:          [id number]  DATE:11/14/2008                            DOB:          14-Dec-1955    HISTORY OF PRESENT ILLNESS:  The patient is a 55 year old male, former  patient of Dr. Eliberto Ivory.  The patient had a cardiac CT scan several years  ago, which showed very mild coronary artery disease within the LAD and  plaquing in the right coronary artery.  The patient had a normal  ejection fraction.  He is very meticulous and request for primary  prevention of coronary artery disease.  He has a Systems analyst at [employer].  He works out 3 times a week.  He brought in his lab work today  that is all within normal limits including his lipid panel with a  cholesterol of 157, triglycerides of 141, HDL of 55, and LDL of 74.  EKG  in the office today also shows normal sinus rhythm, no acute ischemic  changes.  The patient remains active.  He denies any chest pain,  shortness of breath, orthopnea, or PND.   MEDICATIONS:  1. Lipitor 40 mg p.o. nightly.  2. Zetia 10 mg p.o. daily.  3. Lotrel 5/10 mg p.o. daily.  4. Multivitamin daily.  5. Aspirin 325 daily.  6. Tenormin 12.5 mg p.o. daily.  7. Hydrochlorothiazide 75 mg half-tablet p.o. every other day.   PHYSICAL EXAMINATION:  VITAL SIGNS:  Blood pressure is 143/90, heart  rate is 58, and weight is 177 pounds.  NECK:  Normal carotid upstroke.  No carotid bruits.  LUNGS:  Clear breath sounds bilaterally.  HEART:  Regular rate and rhythm.  Normal S1 and S2.  No murmur, rubs, or  gallops.  ABDOMEN:  Soft, nontender.  No rebound or guarding.  Good bowel sounds.  EXTREMITIES:  No cyanosis, clubbing, or edema.  NEUROLOGIC:  The patient is alert and oriented.  Grossly nonfocal.   PROBLEM LIST:  1. Nonobstructive coronary artery disease by cardiac CT scan several      years ago.  2. Status post  multiple negative stress studies.  3. Family history of coronary artery disease.  4. Hypertension, poorly controlled.   PLAN:  1. I have told the patient to increase hydrochlorothiazide to his      daily regimen at 12.5.  He can take it later in the day, so he does      not take it together with sundrops, which causes significant      diuresis.  2. The patient can continue on his exercise program.  I do not think      he needs any stress      testing at this point in time and has had plenty of exposure in the      past.  As a matter of fact, he has a total of 7 Cardiolites      including a CT scan.     Learta Codding, MD,FACC  Electronically Signed    GED/MedQ  DD: 11/14/2008  DT: 11/15/2008  Job #: [id number]

## 2010-10-28 ENCOUNTER — Other Ambulatory Visit: Payer: Self-pay | Admitting: *Deleted

## 2010-10-28 MED ORDER — EZETIMIBE 10 MG PO TABS: 10.0000 mg | ORAL_TABLET | Freq: Every day | ORAL | Status: DC

## 2010-11-27 ENCOUNTER — Other Ambulatory Visit: Payer: Self-pay | Admitting: *Deleted

## 2010-11-27 MED ORDER — EZETIMIBE 10 MG PO TABS: 10.0000 mg | ORAL_TABLET | Freq: Every day | ORAL | Status: DC

## 2011-01-01 ENCOUNTER — Telehealth: Payer: Self-pay | Admitting: Cardiology

## 2011-01-01 MED ORDER — ATORVASTATIN CALCIUM 80 MG PO TABS: 40.0000 mg | ORAL_TABLET | Freq: Every day | ORAL | Status: DC

## 2011-01-01 NOTE — Telephone Encounter (Signed)
.   Requested Prescriptions   Signed Prescriptions Disp Refills  . atorvastatin (LIPITOR) 80 MG tablet 30 tablet 6    Sig: Take 0.5 tablets (40 mg total) by mouth daily.    Authorizing Provider: DE Karma Lew    Ordering User: Lacie Scotts

## 2011-01-05 ENCOUNTER — Other Ambulatory Visit: Payer: Self-pay | Admitting: *Deleted

## 2011-01-05 MED ORDER — EZETIMIBE 10 MG PO TABS: 10.0000 mg | ORAL_TABLET | Freq: Every day | ORAL | Status: DC

## 2011-04-07 ENCOUNTER — Other Ambulatory Visit: Payer: Self-pay | Admitting: *Deleted

## 2011-04-07 ENCOUNTER — Encounter: Payer: Self-pay | Admitting: Cardiology

## 2011-04-07 MED ORDER — AMLODIPINE BESY-BENAZEPRIL HCL 5-10 MG PO CAPS: 1.0000 | ORAL_CAPSULE | Freq: Every day | ORAL | Status: DC

## 2011-04-23 ENCOUNTER — Encounter: Payer: Self-pay | Admitting: Cardiovascular Disease

## 2011-05-05 ENCOUNTER — Other Ambulatory Visit: Payer: Self-pay | Admitting: Cardiology

## 2011-05-06 ENCOUNTER — Ambulatory Visit: Payer: BC Managed Care – PPO | Admitting: Cardiology

## 2011-06-16 ENCOUNTER — Encounter: Payer: Self-pay | Admitting: Cardiology

## 2011-06-16 ENCOUNTER — Ambulatory Visit (INDEPENDENT_AMBULATORY_CARE_PROVIDER_SITE_OTHER): Payer: BC Managed Care – PPO | Admitting: Cardiology

## 2011-06-16 VITALS — BP 138/75 | HR 62 | Ht 70.0 in | Wt 184.0 lb

## 2011-06-16 DIAGNOSIS — Z0389 Encounter for observation for other suspected diseases and conditions ruled out: Secondary | ICD-10-CM

## 2011-06-16 DIAGNOSIS — Z8614 Personal history of Methicillin resistant Staphylococcus aureus infection: Secondary | ICD-10-CM

## 2011-06-16 DIAGNOSIS — I2510 Atherosclerotic heart disease of native coronary artery without angina pectoris: Secondary | ICD-10-CM

## 2011-06-16 DIAGNOSIS — E785 Hyperlipidemia, unspecified: Secondary | ICD-10-CM

## 2011-06-16 DIAGNOSIS — E781 Pure hyperglyceridemia: Secondary | ICD-10-CM

## 2011-06-16 MED ORDER — ATENOLOL 50 MG PO TABS: 12.5000 mg | ORAL_TABLET | Freq: Every day | ORAL | Status: DC

## 2011-06-16 MED ORDER — EZETIMIBE 10 MG PO TABS: 10.0000 mg | ORAL_TABLET | Freq: Every day | ORAL | Status: DC

## 2011-06-16 MED ORDER — AMLODIPINE BESY-BENAZEPRIL HCL 5-10 MG PO CAPS: 1.0000 | ORAL_CAPSULE | Freq: Every day | ORAL | Status: DC

## 2011-06-16 MED ORDER — ATORVASTATIN CALCIUM 80 MG PO TABS: 40.0000 mg | ORAL_TABLET | Freq: Every day | ORAL | Status: DC

## 2011-06-16 MED ORDER — HYDROCHLOROTHIAZIDE 25 MG PO TABS: 12.5000 mg | ORAL_TABLET | Freq: Every day | ORAL | Status: DC

## 2011-06-16 NOTE — Patient Instructions (Signed)
Continue all current medications. Your physician wants you to follow up in:  1 year.  You will receive a reminder letter in the mail one-two months in advance.  If you don't receive a letter, please call our office to schedule the follow up appointment   

## 2011-06-21 ENCOUNTER — Encounter: Payer: Self-pay | Admitting: Cardiology

## 2011-06-21 DIAGNOSIS — Z0389 Encounter for observation for other suspected diseases and conditions ruled out: Secondary | ICD-10-CM | POA: Insufficient documentation

## 2011-06-21 DIAGNOSIS — E781 Pure hyperglyceridemia: Secondary | ICD-10-CM | POA: Insufficient documentation

## 2011-06-21 DIAGNOSIS — E785 Hyperlipidemia, unspecified: Secondary | ICD-10-CM | POA: Insufficient documentation

## 2011-06-21 DIAGNOSIS — Z8614 Personal history of Methicillin resistant Staphylococcus aureus infection: Secondary | ICD-10-CM | POA: Insufficient documentation

## 2011-06-21 NOTE — Progress Notes (Signed)
[name] Bottoms, MD, [facility] ABIM Board Certified in Adult Cardiovascular Medicine,Internal Medicine and Critical Care Medicine    CC: cardiac healthcare maintenance  HPI:  The patient is a 56 year old male with a history of nonobstructive coronary artery disease with only mild plaquing by cardiac CT scan.  The patient's is mainly followed for cardiac, healthcare maintenance and preventive therapies.  He has dyslipidemia and this is followed by his primary care physician and is taking high-dose statin.  He also works out on a regular basis.  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 He previously has been struggling with groin abscesses and axillary abscesses secondary to MRSA infection, but this appears to have resolved.  He was seen in the infectious disease clinic for this problem in the past. Otherwise, the patient reports no significant symptoms.  PMH: reviewed and listed in Problem List in Electronic Records (and see below) Past Medical History  Diagnosis Date  . Coronary artery disease (CAD) excluded     status post cardiac CT scan with only mild plaquing and no obstructive lesions 2006status post stress echo, ejection fraction 55% able to achieved 10  METS  . Hypertriglyceridemia   . Dyslipidemia   . History of MRSA infection    No past surgical history on file.  Allergies/SH/FHX : available in Electronic Records for review  No Known Allergies History   Social History  . Marital Status: Married    Spouse Name: N/A    Number of Children: N/A  . Years of Education: N/A   Occupational History  . Not on file.   Social History Main Topics  . Smoking status: Never Smoker   . Smokeless tobacco: Never Used  . Alcohol Use: Not on file  . Drug Use: Not on file  . Sexually Active: Not on file   Other Topics Concern  . Not on file   Social History Narrative  . No narrative on file   No family history  on file.  Medications: Current Outpatient Prescriptions  Medication Sig Dispense Refill  . amLODipine-benazepril (LOTREL) 5-10 MG per capsule Take 1 capsule by mouth daily.  30 capsule  11  . aspirin 81 MG EC tablet Take 81 mg by mouth daily.        Marland Kitchen atenolol (TENORMIN) 50 MG tablet Take 0.5 tablets (25 mg total) by mouth daily.  15 tablet  11  . atorvastatin (LIPITOR) 80 MG tablet Take 0.5 tablets (40 mg total) by mouth daily.  15 tablet  11  . Dutasteride-Tamsulosin HCl (JALYN) 0.5-0.4 MG CAPS Take 1 capsule by mouth daily.        Marland Kitchen ezetimibe (ZETIA) 10 MG tablet Take 1 tablet (10 mg total) by mouth daily.  30 tablet  11  . hydrochlorothiazide (HYDRODIURIL) 25 MG tablet Take 0.5 tablets (12.5 mg total) by mouth daily.  15 tablet  11  . Multiple Vitamin (MULTIVITAMIN) capsule Take 1 capsule by mouth daily.          ROS: No nausea or vomiting. No fever or chills.No melena or hematochezia.No bleeding.No claudication  Physical Exam: BP 138/75  Pulse 62  Ht 5\' 10"  (1.778 m)  Wt 184 lb (83.462 kg)  BMI 26.40 kg/m2 General:well-nourished white male in no distress. Neck:normal carotid upstroke and no carotid bruits.  No thyromegaly.  Nonnodular thyroid.  JVP is 5 cm Lungs:clear breath sounds bilaterally without wheezing. Cardiac:regular rate and  rhythm normal S1, S2.  No murmur, rubs or gallops Vascular:no edema.  Normal distal pulses Skin:warm and dry Physcologic:normal affect  12lead ECG: Limited bedside ECHO:N/A   Patient Active Problem List  Diagnoses  . HYPERTENSION, UNSPECIFIED  . ALLERGIC RHINITIS CAUSE UNSPECIFIED  . ASTHMA UNSPECIFIED WITH EXACERBATION  . TEMPOROMANDIBULAR JOINT DISORDER  . UNSPECIFIED PROSTATITIS  . CELLULITIS/ABSCESS, HAND  . CELLULITIS/ABSCESS, LEG  . History of MRSA infection  . Dyslipidemia  . Hypertriglyceridemia  . Coronary artery disease (CAD) excluded    PLAN  Continue healthcare maintenance. Followup laboratory work her primary care  physician Continue therapeutic lifestyle changes. I recommended to the patient Cetaphil soap.

## 2011-11-10 ENCOUNTER — Encounter: Payer: Self-pay | Admitting: *Deleted

## 2012-02-04 ENCOUNTER — Emergency Department (HOSPITAL_COMMUNITY)
Admission: EM | Admit: 2012-02-04 | Discharge: 2012-02-04 | Disposition: A | Discharge status: Home / Self Care | Payer: BC Managed Care – PPO | Attending: Emergency Medicine | Admitting: Emergency Medicine

## 2012-02-04 ENCOUNTER — Emergency Department (HOSPITAL_COMMUNITY): Payer: BC Managed Care – PPO

## 2012-02-04 ENCOUNTER — Encounter (HOSPITAL_COMMUNITY): Payer: Self-pay

## 2012-02-04 DIAGNOSIS — I2510 Atherosclerotic heart disease of native coronary artery without angina pectoris: Secondary | ICD-10-CM | POA: Insufficient documentation

## 2012-02-04 DIAGNOSIS — R002 Palpitations: Secondary | ICD-10-CM | POA: Insufficient documentation

## 2012-02-04 DIAGNOSIS — Z8614 Personal history of Methicillin resistant Staphylococcus aureus infection: Secondary | ICD-10-CM | POA: Insufficient documentation

## 2012-02-04 DIAGNOSIS — E785 Hyperlipidemia, unspecified: Secondary | ICD-10-CM | POA: Insufficient documentation

## 2012-02-04 DIAGNOSIS — E781 Pure hyperglyceridemia: Secondary | ICD-10-CM | POA: Insufficient documentation

## 2012-02-04 DIAGNOSIS — Z79899 Other long term (current) drug therapy: Secondary | ICD-10-CM | POA: Insufficient documentation

## 2012-02-04 HISTORY — DX: Dorsopathy, unspecified: M53.9

## 2012-02-04 LAB — CBC WITH DIFFERENTIAL/PLATELET
Basophils Absolute: 0 10*3/uL (ref 0.0–0.1)
Basophils Relative: 1 % (ref 0–1)
Eosinophils Absolute: 0 10*3/uL (ref 0.0–0.7)
Eosinophils Relative: 1 % (ref 0–5)
HCT: 44.7 % (ref 39.0–52.0)
Hemoglobin: 15.9 g/dL (ref 13.0–17.0)
Lymphocytes Relative: 17 % (ref 12–46)
Lymphs Abs: 1.2 10*3/uL (ref 0.7–4.0)
MCH: 32 pg (ref 26.0–34.0)
MCHC: 35.6 g/dL (ref 30.0–36.0)
MCV: 89.9 fL (ref 78.0–100.0)
Monocytes Absolute: 0.4 10*3/uL (ref 0.1–1.0)
Monocytes Relative: 6 % (ref 3–12)
Neutro Abs: 5.4 10*3/uL (ref 1.7–7.7)
Neutrophils Relative %: 76 % (ref 43–77)
Platelets: 211 10*3/uL (ref 150–400)
RBC: 4.97 MIL/uL (ref 4.22–5.81)
RDW: 13.2 % (ref 11.5–15.5)
WBC: 7.2 10*3/uL (ref 4.0–10.5)

## 2012-02-04 LAB — BASIC METABOLIC PANEL
BUN: 10 mg/dL (ref 6–23)
CO2: 27 mEq/L (ref 19–32)
Calcium: 10.4 mg/dL (ref 8.4–10.5)
Chloride: 101 mEq/L (ref 96–112)
Creatinine, Ser: 0.92 mg/dL (ref 0.50–1.35)
GFR calc Af Amer: >90 mL/min (ref >90)
GFR calc non Af Amer: >90 mL/min (ref >90)
Glucose, Bld: 115 mg/dL — ABNORMAL HIGH (ref 70–99)
Potassium: 3.7 mEq/L (ref 3.5–5.1)
Sodium: 140 mEq/L (ref 135–145)

## 2012-02-04 IMAGING — CR DG CHEST 2V
2 series · 2 of 2 positions shown · non-contrast
Comparison: Cardiac CT [DATE].  Chest radiographs [DATE].

CLINICAL DATA: Irregular heartbeat.

CHEST - 2 VIEW

[w chest pa]
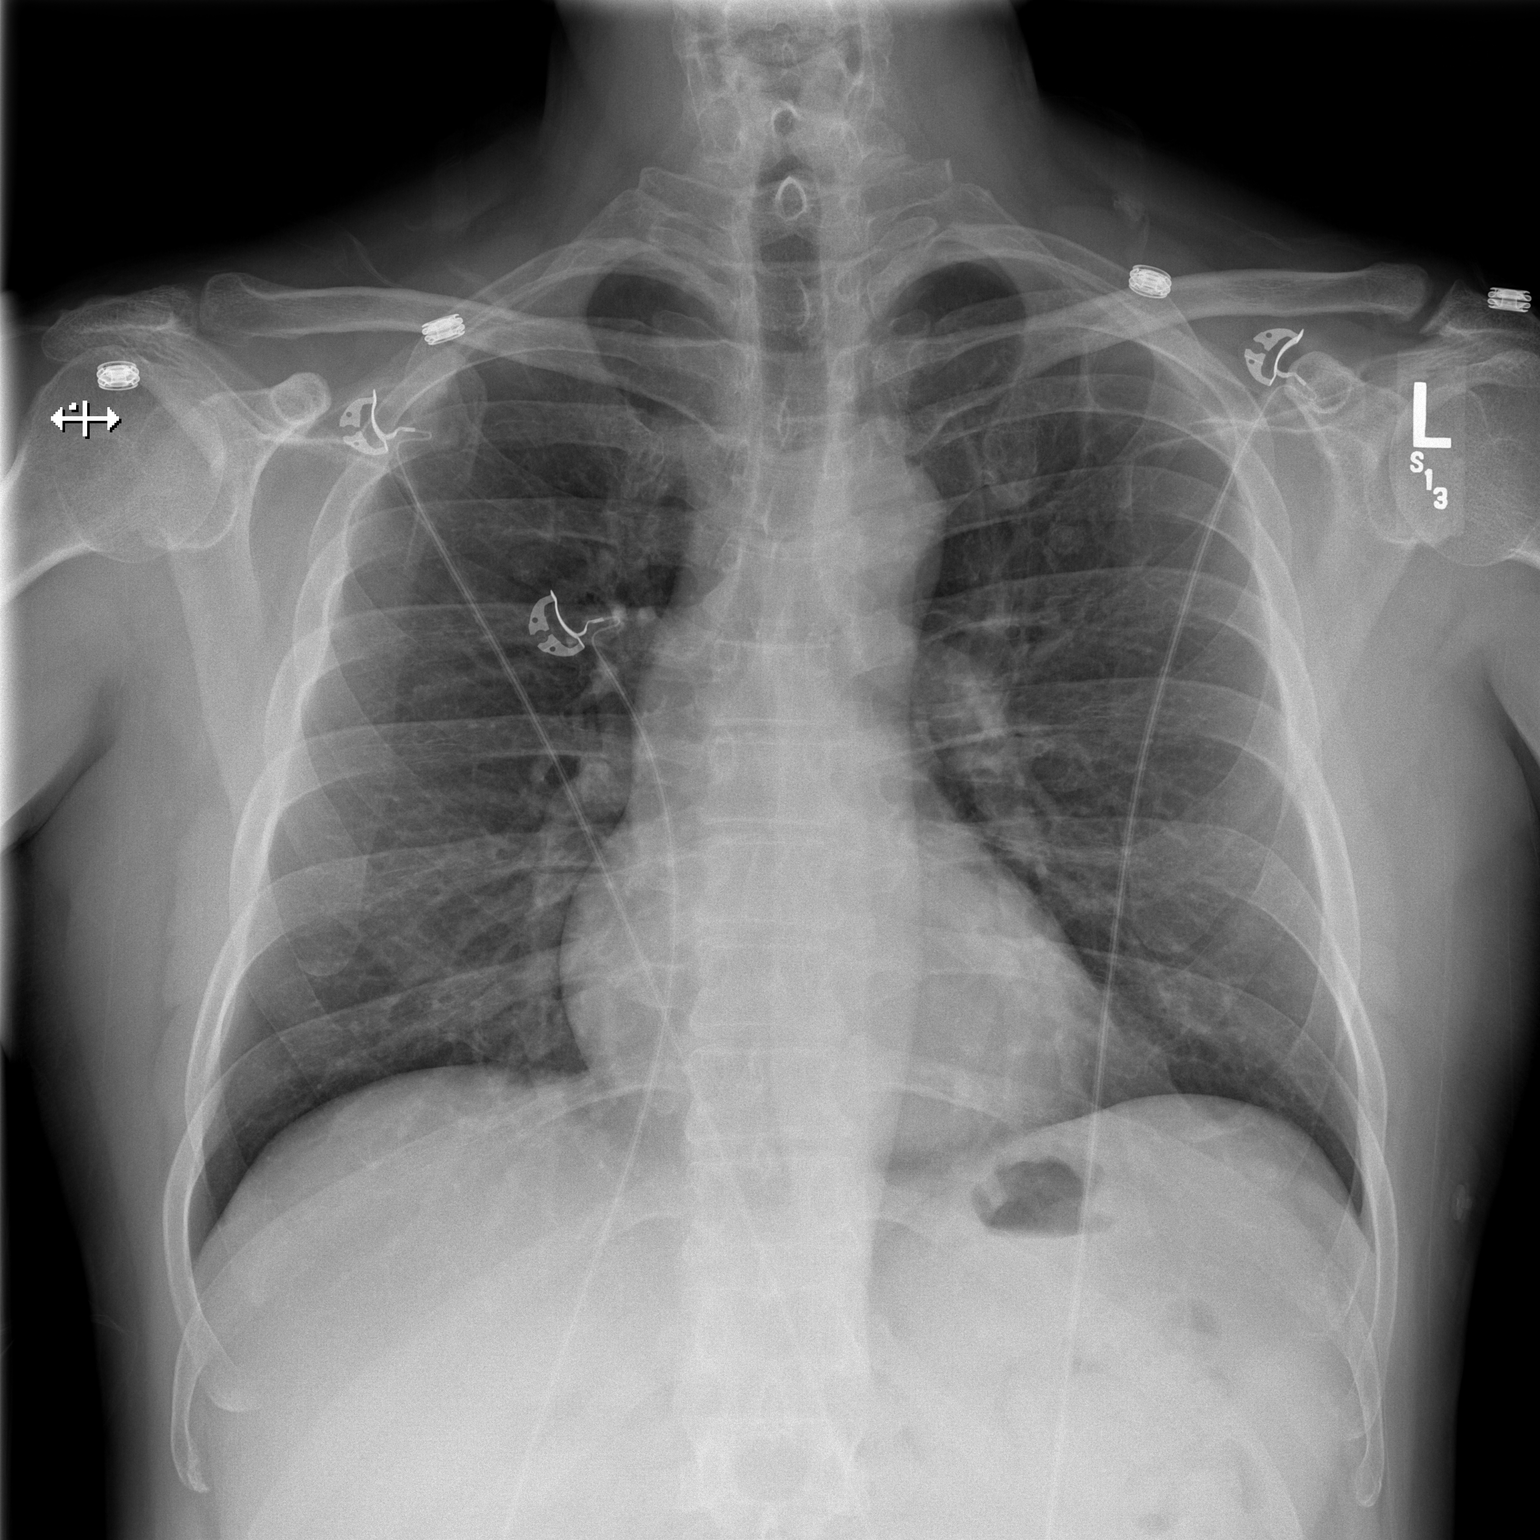

[w chest lat]
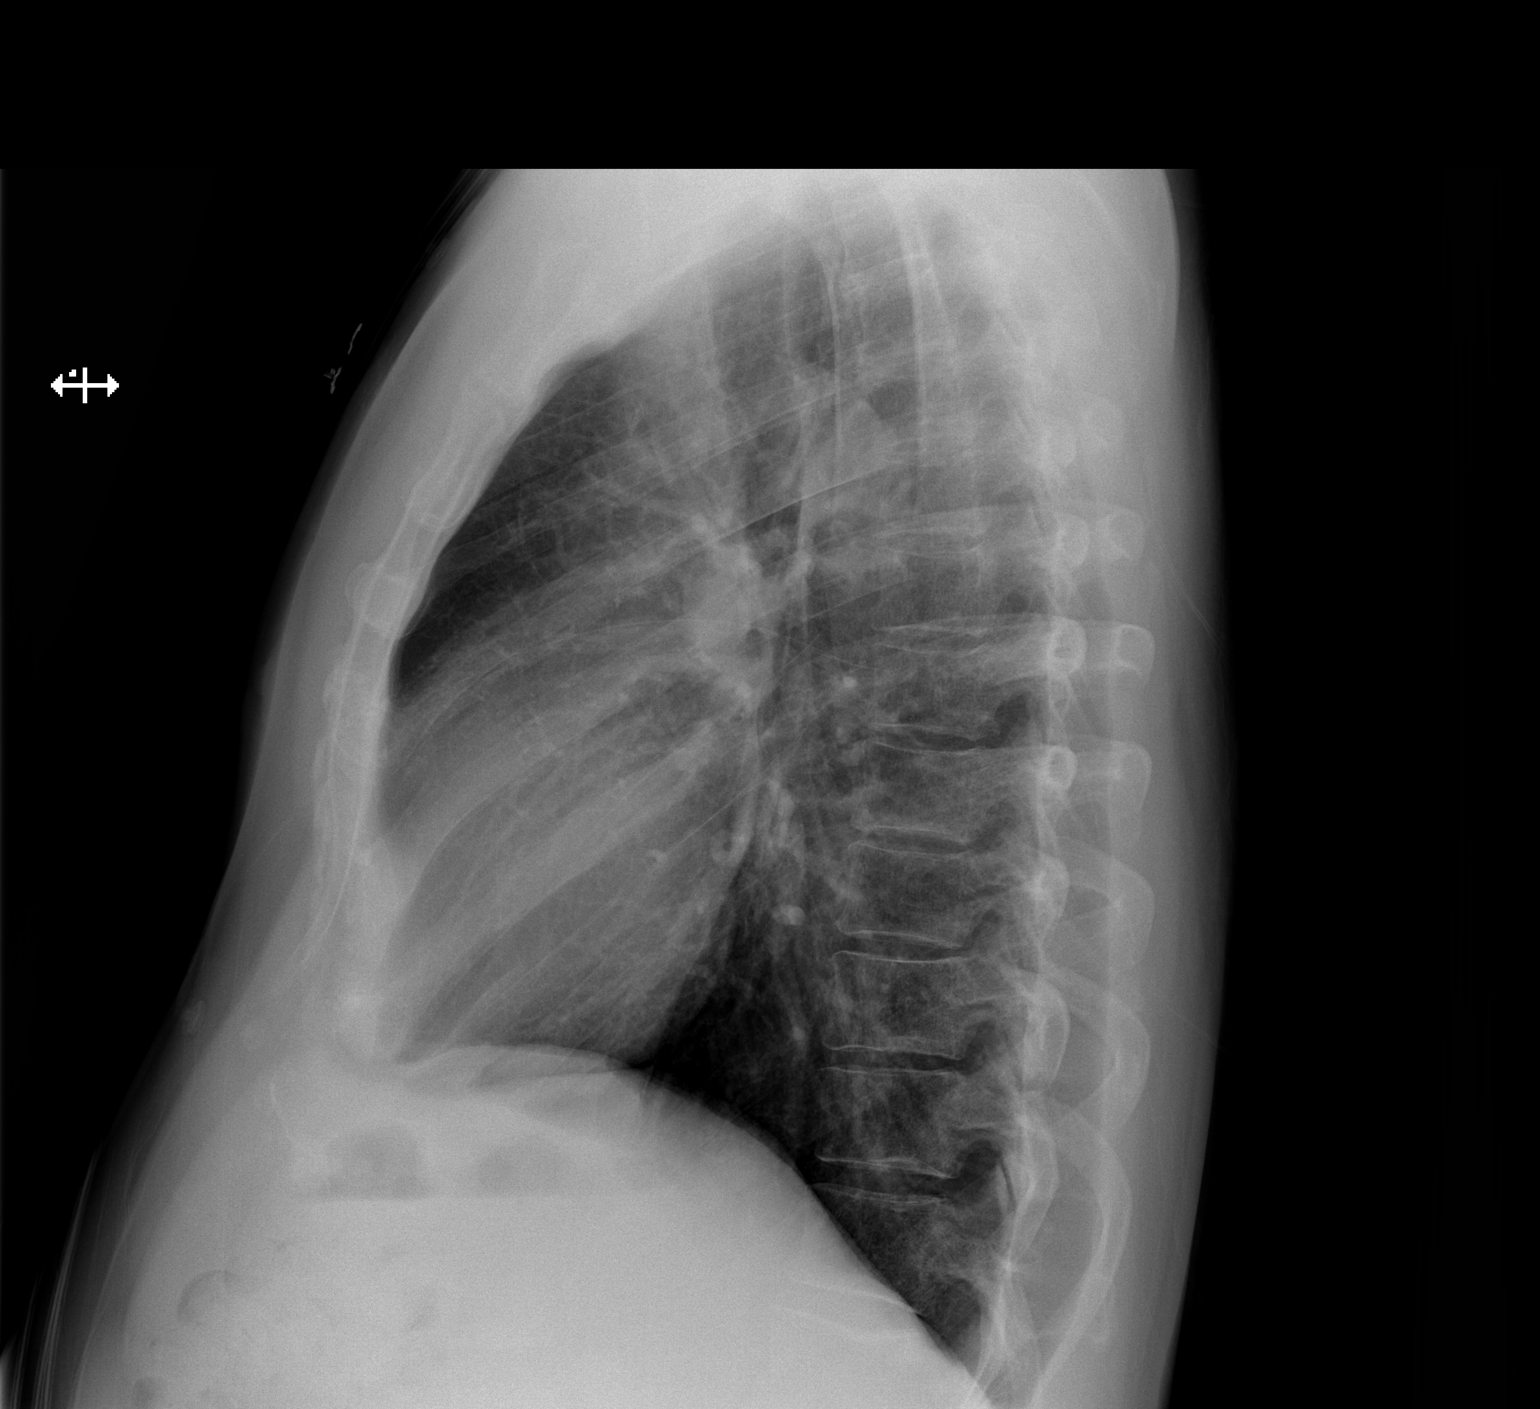

[2 of 2 positions shown; findings below may reference images not displayed]

FINDINGS: The heart size and mediastinal contours are normal. The
lungs are clear. There is no pleural effusion or pneumothorax. No
acute osseous findings are identified.  Telemetry leads and snaps
overlie the chest.
IMPRESSION: Stable examination.  No active cardiopulmonary process.

## 2012-02-04 NOTE — ED Notes (Signed)
Dr at bedside.

## 2012-02-04 NOTE — ED Provider Notes (Signed)
History    CSN: [id number] Arrival date & time 02/04/12  1250 First MD Initiated Contact with Patient 02/04/12 1503     Chief Complaint  Patient presents with  . Irregular Heart Beat   HPI Comments: Pt noticed on Sunday that his heart was racing on Sunday for about 60 seconds.   last night and noticed that his BP was elevated.  He had another episode today about 1225 where it occurred again.  He feels his heart beating in his chest and stomach and he feels flushed and had a dry mouth.  Patient is a 56 y.o. male presenting with palpitations. The history is provided by the patient.  Palpitations  This is a new problem. The current episode started more than 2 days ago. Episode frequency: every few days. The problem has been resolved. The problem is associated with an unknown (not with exercise) factor. Associated symptoms include irregular heartbeat, near-syncope and nausea. Pertinent negatives include no fever, no chest pain, no exertional chest pressure, no syncope, no abdominal pain, no vomiting, no dizziness, no cough and no shortness of breath.  Pt exercises regularly without difficulty.  When he had the second episode this afternoon it was severe enough to want him to get checked out.  He feels fine now. Recent travel to [facility] two weeks ago.  No leg swelling or dyspnea.  Past Medical History  Diagnosis Date  . Coronary artery disease (CAD) excluded     status post cardiac CT scan with only mild plaquing and no obstructive lesions 2006status post stress echo, ejection fraction 55% able to achieved 10  METS  . Hypertriglyceridemia   . Dyslipidemia   . History of MRSA infection   . Back problem     Past Surgical History  Procedure Date  . Back surgery     Family History  Problem Relation Age of Onset  . Heart disease    . Hypertension    . Stroke    Son: "some type of adrenal disorder", had palpitations in the past  History  Substance Use Topics  . Smoking status: Never  Smoker   . Smokeless tobacco: Never Used  . Alcohol Use: Yes     daily      Review of Systems  Constitutional: Negative for fever.  Respiratory: Negative for cough and shortness of breath.   Cardiovascular: Positive for palpitations and near-syncope. Negative for chest pain and syncope.  Gastrointestinal: Positive for nausea. Negative for vomiting and abdominal pain.  Neurological: Negative for dizziness.  All other systems reviewed and are negative.    Allergies  Review of patient's allergies indicates no known allergies.  Home Medications   Current Outpatient Rx  Name Route Sig Dispense Refill  . AMLODIPINE BESY-BENAZEPRIL HCL 5-10 MG PO CAPS Oral Take 1 capsule by mouth daily. 30 capsule 11  . ASPIRIN 81 MG PO TBEC Oral Take 81 mg by mouth daily.      . ATENOLOL 50 MG PO TABS Oral Take 0.5 tablets (25 mg total) by mouth daily. 15 tablet 11  . ATORVASTATIN CALCIUM 80 MG PO TABS Oral Take 0.5 tablets (40 mg total) by mouth daily. 15 tablet 11  . DUTASTERIDE-TAMSULOSIN HCL 0.5-0.4 MG PO CAPS Oral Take 1 capsule by mouth daily.      Marland Kitchen EZETIMIBE 10 MG PO TABS Oral Take 1 tablet (10 mg total) by mouth daily. 30 tablet 11  . HYDROCHLOROTHIAZIDE 25 MG PO TABS Oral Take 0.5 tablets (12.5 mg total) by  mouth daily. 15 tablet 11  . MULTIVITAMINS PO CAPS Oral Take 1 capsule by mouth daily.        BP 150/86  Pulse 72  Temp 97.7 F (36.5 C) (Oral)  Resp 17  SpO2 100%  Physical Exam  Nursing note and vitals reviewed. Constitutional: He appears well-developed and well-nourished. No distress.  HENT:  Head: Normocephalic and atraumatic.  Right Ear: External ear normal.  Left Ear: External ear normal.  Eyes: Conjunctivae are normal. Right eye exhibits no discharge. Left eye exhibits no discharge. No scleral icterus.  Neck: Neck supple. No tracheal deviation present. No thyromegaly present.  Cardiovascular: Normal rate, regular rhythm and intact distal pulses.   Pulmonary/Chest:  Effort normal and breath sounds normal. No stridor. No respiratory distress. He has no wheezes. He has no rales.  Abdominal: Soft. Bowel sounds are normal. He exhibits no distension. There is no tenderness. There is no rebound and no guarding.  Musculoskeletal: He exhibits no edema and no tenderness.  Neurological: He is alert. He has normal strength. No sensory deficit. Cranial nerve deficit:  no gross defecits noted. He exhibits normal muscle tone. He displays no seizure activity. Coordination normal.  Skin: Skin is warm and dry. No rash noted.  Psychiatric: He has a normal mood and affect.    ED Course  Procedures (including critical care time) EKG Rate 64 SINUS RHYTHM ~ normal P axis, V-rate 50- 99 BORDERLINE SHORT PR INTERVAL ~ PR int <140mS LEFT VENTRICULAR HYPERTROPHY ~ (SV1+RV5)>3.5/(RaVL+SV3)>2.80 LATERAL INFARCT, OLD ~ Q>58mS, abnormal ST-T, I aVL V5-6  Labs Reviewed  BASIC METABOLIC PANEL - Abnormal; Notable for the following:    Glucose, Bld 115 (*)     All other components within normal limits  CBC WITH DIFFERENTIAL  T4, FREE  TSH   Dg Chest 2 View  02/04/2012  *RADIOLOGY REPORT*  Clinical Data: Irregular heartbeat.  CHEST - 2 VIEW  Comparison: Cardiac CT 01/07/2007.  Chest radiographs 07/09/2011.  Findings: The heart size and mediastinal contours are normal. The lungs are clear. There is no pleural effusion or pneumothorax. No acute osseous findings are identified.  Telemetry leads and snaps overlie the chest.  IMPRESSION: Stable examination.  No active cardiopulmonary process.   Original Report Authenticated By: Gerrianne Scale, M.D.     MDM  Pt without signs of dysrhythmia in the emergency department. I have sent off thyroid studies to help with outpatient followup. At this time he does not appear to have any acute emergency medical condition. He is hemodynamically stable. I will have him followup with his cardiologist and discuss further testing such as outpatient  Holter monitoring.        Celene Kras, MD 02/04/12 [phone]

## 2012-02-04 NOTE — ED Notes (Signed)
Patient reports that fhe had an irregular heart beat lasting approx. 60 seconds on Sunday. Patient felt like when he went to go work out he felt his heart beat into his ankles, hands, mildly light -headed. Patient also c/o dry mouth. Patient states he has no heart history.

## 2012-02-04 NOTE — ED Notes (Signed)
Pt reports that on Sunday he started feeling an irregular heart rate that lasted about 60 seconds.  Monday and Tuesday pt reports being fine.  Last night pt reports not feeling well.  Checked his BP 180/110.  Today pt reports at about 1225 not feeling well again and having an irregular heart rate.  Pt reports feeling "flushed, dry mouth, heart beating down in my stomach, arms, ankles, and legs; enough to notice".  Heart rate subsided en route to ED.  Pt denies any chest pain, n/v/d.  Nad.

## 2012-02-05 ENCOUNTER — Ambulatory Visit (INDEPENDENT_AMBULATORY_CARE_PROVIDER_SITE_OTHER): Payer: BC Managed Care – PPO | Admitting: Cardiology

## 2012-02-05 ENCOUNTER — Encounter: Payer: Self-pay | Admitting: Cardiology

## 2012-02-05 VITALS — BP 120/76 | HR 59 | Ht 70.0 in | Wt 174.0 lb

## 2012-02-05 DIAGNOSIS — Z8614 Personal history of Methicillin resistant Staphylococcus aureus infection: Secondary | ICD-10-CM

## 2012-02-05 DIAGNOSIS — Z0389 Encounter for observation for other suspected diseases and conditions ruled out: Secondary | ICD-10-CM

## 2012-02-05 DIAGNOSIS — E785 Hyperlipidemia, unspecified: Secondary | ICD-10-CM

## 2012-02-05 LAB — TSH: TSH: 1.461 u[IU]/mL (ref 0.350–4.500)

## 2012-02-05 LAB — T4, FREE: Free T4: 1.54 ng/dL (ref 0.80–1.80)

## 2012-02-05 NOTE — Patient Instructions (Signed)
Continue all current medications. Your physician wants you to follow up in: 6 months.  You will receive a reminder letter in the mail one-two months in advance.  If you don't receive a letter, please call our office to schedule the follow up appointment   

## 2012-02-10 ENCOUNTER — Telehealth: Payer: Self-pay | Admitting: Cardiology

## 2012-02-10 NOTE — Telephone Encounter (Signed)
Patient states that Dr. 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  He needs an order for it.  Fax to [phone].

## 2012-02-11 NOTE — Telephone Encounter (Signed)
Left message to return call 

## 2012-02-12 NOTE — Assessment & Plan Note (Signed)
resolved 

## 2012-02-12 NOTE — Assessment & Plan Note (Signed)
-   Continue therapeutic lifestyle changes.

## 2012-02-12 NOTE — Telephone Encounter (Signed)
Left message to return call 

## 2012-02-12 NOTE — Assessment & Plan Note (Signed)
Patient doing well and reports no recurrent symptoms or any symptoms suggestive of angina.  At some occasional shooting pains possibly related to C6.  At this point no further cardiac workup is needed.

## 2012-02-12 NOTE — Progress Notes (Signed)
[name] Bottoms, MD, [facility] Board Certified in Adult Cardiovascular Medicine,Internal Medicine and Critical Care Medicine    CC:   Routine followup                                                                               HPI:        Patient is doing well.  He reports no orthopnea or PND.  He reports no palpitations or syncope.reports an irregular heartbeats and occasional episode of hypertension but this has resolved.  He actually was seen and was a long emergency room by Dr. Lynelle Doctor.  No significant abnormalities were found at that time. The patient states that he is currently doing well from a cardiovascular perspective.   PMH: reviewed and listed in Problem List in Electronic Records (and see below) Past Medical History  Diagnosis Date  . Coronary artery disease (CAD) excluded     status post cardiac CT scan with only mild plaquing and no obstructive lesions 2006status post stress echo, ejection fraction 55% able to achieved 10  METS  . Hypertriglyceridemia   . Dyslipidemia   . History of MRSA infection   . Back problem    Past Surgical History  Procedure Date  . Back surgery     Allergies/SH/FHX : available in Electronic Records for review  No Known Allergies History   Social History  . Marital Status: Married    Spouse Name: N/A    Number of Children: N/A  . Years of Education: N/A   Occupational History  . Not on file.   Social History Main Topics  . Smoking status: Never Smoker   . Smokeless tobacco: Never Used  . Alcohol Use: Yes     daily  . Drug Use: No  . Sexually Active: Not on file   Other Topics Concern  . Not on file   Social History Narrative  . No narrative on file   Family History  Problem Relation Age of Onset  . Heart disease    . Hypertension    . Stroke      Medications: Current Outpatient Prescriptions  Medication Sig Dispense Refill  . amLODipine-benazepril (LOTREL) 5-10 MG per capsule Take 1 capsule by mouth daily.  30  capsule  11  . aspirin 81 MG EC tablet Take 81 mg by mouth daily.        Marland Kitchen atenolol (TENORMIN) 50 MG tablet Take 0.5 tablets (25 mg total) by mouth daily.  15 tablet  11  . atorvastatin (LIPITOR) 80 MG tablet Take 0.5 tablets (40 mg total) by mouth daily.  15 tablet  11  . Dutasteride-Tamsulosin HCl (JALYN) 0.5-0.4 MG CAPS Take 1 capsule by mouth daily.        Marland Kitchen ezetimibe (ZETIA) 10 MG tablet Take 1 tablet (10 mg total) by mouth daily.  30 tablet  11  . hydrochlorothiazide (HYDRODIURIL) 25 MG tablet Take 0.5 tablets (12.5 mg total) by mouth daily.  15 tablet  11  . Multiple Vitamin (MULTIVITAMIN) capsule Take 1 capsule by mouth daily.          ROS: No nausea or vomiting. No fever or chills.No melena or hematochezia.No bleeding.No claudication  Physical Exam: BP 120/76  Pulse 59  Ht 5\' 10"  (1.778 m)  Wt 174 lb (78.926 kg)  BMI 24.97 kg/m2  SpO2 98% General:well-nourished white male in no distress Neck:normal carotid upstroke milligrams bruits Lungs:clear breath sounds bilaterally no wheezing Cardiac:regular rate and rhythm with normal S1 and S2 Vascular:no edema and normal distal pulses Skin:warm and dry Physcologic:normal affect  12lead ECG:no performed Limited bedside ECHO:N/A No images are attached to the encounter.   I reviewed and summarized the old records. I reviewed ECG and prior blood work.  Assessment and Plan  Coronary artery disease (CAD) excluded Patient doing well and reports no recurrent symptoms or any symptoms suggestive of angina.  At some occasional shooting pains possibly related to C6.  At this point no further cardiac workup is needed.  History of MRSA infection resolved  Dyslipidemia Continue therapeutic lifestyle changes.followup lipid panel with his primary care physician.    Patient Active Problem List  Diagnosis  . HYPERTENSION, UNSPECIFIED  . ALLERGIC RHINITIS CAUSE UNSPECIFIED  . ASTHMA UNSPECIFIED WITH EXACERBATION  . TEMPOROMANDIBULAR  JOINT DISORDER  . UNSPECIFIED PROSTATITIS  . CELLULITIS/ABSCESS, HAND  . CELLULITIS/ABSCESS, LEG  . History of MRSA infection  . Dyslipidemia  . Hypertriglyceridemia  . Coronary artery disease (CAD) excluded

## 2012-02-15 NOTE — Telephone Encounter (Signed)
Left message to return call 

## 2012-02-16 NOTE — Telephone Encounter (Signed)
Attempted to reach patient again.  Message left on voice mail that Dr. Andee Lineman is no longer with our practice.  Our providers do not prescribe this type of monitor and number was left for patient to contact [facility] if he chooses to do so.  [phone]).

## 2012-02-19 ENCOUNTER — Other Ambulatory Visit: Payer: Self-pay | Admitting: Cardiology

## 2012-07-18 ENCOUNTER — Other Ambulatory Visit: Payer: Self-pay | Admitting: Cardiology

## 2012-08-10 ENCOUNTER — Encounter: Payer: Self-pay | Admitting: Cardiology

## 2012-10-04 ENCOUNTER — Encounter: Payer: Self-pay | Admitting: Cardiology

## 2012-10-24 ENCOUNTER — Other Ambulatory Visit: Payer: Self-pay | Admitting: Physician Assistant

## 2012-10-27 ENCOUNTER — Encounter: Payer: Self-pay | Admitting: Cardiovascular Disease

## 2012-10-27 ENCOUNTER — Ambulatory Visit (INDEPENDENT_AMBULATORY_CARE_PROVIDER_SITE_OTHER): Payer: BC Managed Care – PPO | Admitting: Cardiovascular Disease

## 2012-10-27 VITALS — BP 130/80 | HR 69 | Ht 70.0 in | Wt 174.1 lb

## 2012-10-27 DIAGNOSIS — I10 Essential (primary) hypertension: Secondary | ICD-10-CM

## 2012-10-27 DIAGNOSIS — Z0389 Encounter for observation for other suspected diseases and conditions ruled out: Secondary | ICD-10-CM

## 2012-10-27 DIAGNOSIS — R002 Palpitations: Secondary | ICD-10-CM

## 2012-10-27 DIAGNOSIS — E785 Hyperlipidemia, unspecified: Secondary | ICD-10-CM

## 2012-10-27 DIAGNOSIS — E781 Pure hyperglyceridemia: Secondary | ICD-10-CM

## 2012-10-27 DIAGNOSIS — R5381 Other malaise: Secondary | ICD-10-CM

## 2012-10-27 DIAGNOSIS — R5383 Other fatigue: Secondary | ICD-10-CM

## 2012-10-27 NOTE — Patient Instructions (Signed)
Your physician recommends that you return for lab work.  He also ordered a MET-TEST. Your physician recommends that you schedule a follow-up appointment in:4 weeks

## 2012-11-02 ENCOUNTER — Encounter: Payer: Self-pay | Admitting: Cardiovascular Disease

## 2012-11-02 NOTE — Progress Notes (Signed)
Patient ID: [name] [name], male   DOB: 12/07/1955, 57 y.o.   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 Andee Lineman in the past. He has a strong family history for coronary artery disease. He presents today to establish cardiology care with me and is interested in aggressive preventive assessment.    HPI: [name] [name] has a strong family history for heart disease in both parents as well as grandparents. For this reason, he has been very aggressive in his attempt at preventing coronary obstructive disease. He does have a history of hypertension and has [facility], remotely had been followed by Dr. Patty Sermons and had undergone several myocardial perfusion studies.  In 2006 and 2007 he also underwent several [facility] lab evaluations. In September 2006 his cholesterol was 188, LDL 90, HDL 40 but his triglycerides were 292. He is LDL IIIa plus B. percent was significantly increased at 50.5. HDL to be percent was low at 11. Most recently, he has been on statin plus zetia for lipid lowering therapy. He started to see Dr. Candee Furbish after Dr. Kandis Cocking 2008 had a CT anterior gram which showed a calcium score 194. He did have mild coronary obstructive disease with less than 50% calcific lesion noted in the mid LAD and less than 30% calcific disease in the proximal and mid right coronary artery.  Subsequently, he has undergone an exercise stress echo study in November 2011 which was normal. The patient has remained asymptomatic and most of his studies were screening evaluations for CAD. Since Dr. Candee Furbish has left his practice in [place], [name] [name] has selected me to pursue his aggressive preventive cardiology evaluation. [name] [name] remains active. He exercises regularly and works out with a trainer at least 2 days per week. This past year he skied both at [place], [place] and [place], [place] without cardiovascular problems. He also  does a ladder protocol on the elliptical trainer he has a 7 handicap in golf. He apparently was seen in the emergency room at [facility] for some symptoms of chest sensation in October 2013. He has not had recent laboratory  and he desires for assessment for coronary disease  Past Medical History  Diagnosis Date  . Coronary artery disease (CAD) excluded     status post cardiac CT scan with only mild plaquing and no obstructive lesions 2006status post stress echo, ejection fraction 55% able to achieved 10  METS  . Hypertriglyceridemia   . Dyslipidemia   . History of MRSA infection   . Back problem     Past Surgical History  Procedure Laterality Date  . Back surgery      No Known Allergies  Current Outpatient Prescriptions  Medication Sig Dispense Refill  . amLODipine-benazepril (LOTREL) 5-10 MG per capsule Take 1 capsule by mouth daily.  30 capsule  11  . aspirin 81 MG EC tablet Take 81 mg by mouth daily.        Marland Kitchen atenolol (TENORMIN) 50 MG tablet Take 0.5 tablets (25 mg total) by mouth daily.  15 tablet  11  . atorvastatin (LIPITOR) 80 MG tablet TAKE 1/2 TABLET (40 MG TOTAL) BY MOUTH DAILY.  15 tablet  5  . Dutasteride-Tamsulosin HCl (JALYN) 0.5-0.4 MG CAPS Take 1 capsule by mouth daily.        . hydrochlorothiazide (HYDRODIURIL) 25 MG tablet Take 0.5 tablets (12.5 mg total) by mouth daily.  15 tablet  11  .  Multiple Vitamin (MULTIVITAMIN) capsule Take 1 capsule by mouth daily.        Marland Kitchen ZETIA 10 MG tablet TAKE 1 TABLET BY MOUTH DAILY  15 tablet  0   No current facility-administered medications for this visit.   Socially, he is married for 28 years. He works in Metallurgist. He did his undergraduate degree at [employer]. 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 There is no tobacco history. He does drink alcohol socially.  Family History  Problem Relation Age of Onset  . Heart disease    . Hypertension    . Stroke    . Heart attack Father    . Heart attack Mother   . Hypertension Mother   . Hypertension Father   . Hyperlipidemia Father   . Hyperlipidemia Mother     ROS is negative for fever chills or night sweats. He denies any palpitations. He does have a history of hypertension and elevated lipids. He denies presyncope or syncope. He denies PND orthopnea. He is unaware of any significant change in exercise tolerance. He denies bleeding appear he denies cough or pain. He denies change in GU symptoms or GI symptoms. He denies claudication. He denies paresthesias. He denies edema.  PE BP 130/80  Pulse 69  Ht 5\' 10"  (1.778 m)  Wt 174 lb 1.6 oz (78.971 kg)  BMI 24.98 kg/m2 General: Alert, oriented, no distress.  HEENT: Normocephalic, atraumatic. Pupils round and reactive; sclera anicteric; Fundi normal Nose without nasal septal hypertrophy Mouth/Parynx benign; Mallinpatti scale 2 Neck: No JVD, no carotid briuts Lungs: clear to ausculatation and percussion; no wheezing or rales Heart: RRR, s1 s2 normal no s3. No murmur, gallop, rubs or heave. Abdomen: soft, nontender; no hepatosplenomehaly, BS+; abdominal aorta nontender and not dilated by palpation. Pulses 2+ Extremities: no clubbinbg cyanosis or edema, Homan's sign negative  Neurologic: grossly nonfocal    ECG: Normal sinus rhythm with voltage criteria for LVH.  LABS:  BMET    Component Value Date/Time   NA 140 02/04/2012 1530   K 3.7 02/04/2012 1530   CL 101 02/04/2012 1530   CO2 27 02/04/2012 1530   GLUCOSE 115* 02/04/2012 1530   BUN 10 02/04/2012 1530   CREATININE 0.92 02/04/2012 1530   CALCIUM 10.4 02/04/2012 1530   GFRNONAA >90 02/04/2012 1530   GFRAA >90 02/04/2012 1530     Hepatic Function Panel  No results found for this basename: prot, albumin, ast, alt, alkphos, bilitot, bilidir, ibili     CBC    Component Value Date/Time   WBC 7.2 02/04/2012 1530   RBC 4.97 02/04/2012 1530   HGB 15.9 02/04/2012 1530   HCT 44.7 02/04/2012 1530   PLT 211 02/04/2012  1530   MCV 89.9 02/04/2012 1530   MCH 32.0 02/04/2012 1530   MCHC 35.6 02/04/2012 1530   RDW 13.2 02/04/2012 1530   LYMPHSABS 1.2 02/04/2012 1530   MONOABS 0.4 02/04/2012 1530   EOSABS 0.0 02/04/2012 1530   BASOSABS 0.0 02/04/2012 1530     BNP No results found for this basename: probnp    Lipid Panel  No results found for this basename: chol, trig, hdl, cholhdl, vldl, ldlcalc     RADIOLOGY: No results found.   ASSESSMENT AND PLAN: I have obtained records from Dr. Patty Sermons and Dr. Candee Furbish. [name] [name] family history for aorta disease in multiple family members as well as a history of hypertension and documented hyperlipidemia with an increase number of small  LDL particles noted on [facility] lab assessment in the past. He has had a normal myocardial perfusion scan on several occasions and in 2008 was found to have nonobstructive coronary calcification involving his LAD and right coronary arteries. His last stress echo study was apparently normal. He has been aggressively treated with lipid-lowering therapy. I'm not recommending a complete set of laboratory be obtained including a CBC, CMet, TSH, Vit D and will also perform an NMR lipoprofile to quantify and HDL particle numbers for aggressive therapy. I am scheduling him for a cardiopulmonary met exercise test to measure oxygen consumption and further evaluate potential for endothelial dysfunction/small vessel coronary artery disease particularly since his nuclear perfusion scans have been normal in the past. I will see him back in the office in 6 weeks for followup evaluation.    Lennette Bihari, MD, [facility] 11/02/2012 4:55 PM

## 2012-11-13 ENCOUNTER — Other Ambulatory Visit: Payer: Self-pay | Admitting: Physician Assistant

## 2012-11-14 ENCOUNTER — Telehealth: Payer: Self-pay | Admitting: Internal Medicine

## 2012-11-14 NOTE — Telephone Encounter (Signed)
Pt is requesting records from previous Gi to be reviewed. He would like to see Dr. Marina Goodell.

## 2012-11-16 ENCOUNTER — Telehealth: Payer: Self-pay | Admitting: Cardiovascular Disease

## 2012-11-16 MED ORDER — EZETIMIBE 10 MG PO TABS: 10.0000 mg | ORAL_TABLET | Freq: Every day | ORAL | Status: DC

## 2012-11-16 NOTE — Telephone Encounter (Signed)
Pt needs a Rx refill for Zedia. Also has a question about his CMET test for Monday. Pt would like to know about the medications that he is taking and what he needs to not take. He would like for someone to please call him back

## 2012-11-16 NOTE — Telephone Encounter (Signed)
Returned call.  Pt stated he is new pt of Dr. Landry Dyke and is need of a refill on Zetia.  Refill(s) sent to pharmacy and pt also informed of refill process and verbalized understanding.  Pt also stated he is having a MET test on Monday and wanted to know if he needs to hold his tenormin.  Stated when he has had this and other tests in the past, he was told to hold it, but his instructions state to take all meds the day of the test.  Pt advised to take all meds as the instructions stated.  Pt still with concerns and informed RN will discuss w/ MD/PA for further instructions.  Pt verbalized understanding and agreed w/ plan.  Penni Bombard, PA-C notified and advised pt hold tenormin or atenolol before the MET test.  Pt informed and verbalized understanding.

## 2012-11-16 NOTE — Telephone Encounter (Signed)
Returned call.  Left message to call back before 4pm.  

## 2012-11-21 ENCOUNTER — Encounter (INDEPENDENT_AMBULATORY_CARE_PROVIDER_SITE_OTHER): Payer: BC Managed Care – PPO

## 2012-11-21 DIAGNOSIS — R0602 Shortness of breath: Secondary | ICD-10-CM

## 2012-11-21 LAB — CBC
HCT: 45.7 % (ref 39.0–52.0)
Hemoglobin: 16.1 g/dL (ref 13.0–17.0)
MCH: 31.7 pg (ref 26.0–34.0)
MCHC: 35.2 g/dL (ref 30.0–36.0)
MCV: 90 fL (ref 78.0–100.0)
Platelets: 206 10*3/uL (ref 150–400)
RBC: 5.08 MIL/uL (ref 4.22–5.81)
RDW: 14.4 % (ref 11.5–15.5)
WBC: 6.8 10*3/uL (ref 4.0–10.5)

## 2012-11-21 LAB — COMPREHENSIVE METABOLIC PANEL
ALT: 41 U/L (ref 0–53)
AST: 30 U/L (ref 0–37)
Albumin: 4.9 g/dL (ref 3.5–5.2)
Alkaline Phosphatase: 82 U/L (ref 39–117)
BUN: 13 mg/dL (ref 6–23)
CO2: 26 mEq/L (ref 19–32)
Calcium: 9.8 mg/dL (ref 8.4–10.5)
Chloride: 101 mEq/L (ref 96–112)
Creat: 1.01 mg/dL (ref 0.50–1.35)
Glucose, Bld: 105 mg/dL — ABNORMAL HIGH (ref 70–99)
Potassium: 3.9 mEq/L (ref 3.5–5.3)
Sodium: 137 mEq/L (ref 135–145)
Total Bilirubin: 1 mg/dL (ref 0.3–1.2)
Total Protein: 7.7 g/dL (ref 6.0–8.3)

## 2012-11-21 LAB — TSH: TSH: 1.719 u[IU]/mL (ref 0.350–4.500)

## 2012-11-22 LAB — NMR LIPOPROFILE WITH LIPIDS
Cholesterol, Total: 172 mg/dL (ref <200)
HDL Particle Number: 39.4 umol/L (ref >=30.5)
HDL Size: 9.5 nm (ref >=9.2)
HDL-C: 46 mg/dL (ref >=40)
LDL (calc): 61 mg/dL (ref <100)
LDL Particle Number: 797 nmol/L (ref <1000)
LDL Size: 19.6 nm — ABNORMAL LOW (ref >20.5)
LP-IR Score: 57 — ABNORMAL HIGH (ref <=45)
Large HDL-P: 5.5 umol/L (ref >=4.8)
Large VLDL-P: 9.5 nmol/L — ABNORMAL HIGH (ref <=2.7)
Small LDL Particle Number: 631 nmol/L — ABNORMAL HIGH (ref <=527)
Triglycerides: 325 mg/dL — ABNORMAL HIGH (ref <150)
VLDL Size: 48.2 nm — ABNORMAL HIGH (ref <=46.6)

## 2012-11-25 LAB — VITAMIN D 1,25 DIHYDROXY
Vitamin D 1, 25 (OH)2 Total: 62 pg/mL (ref 18–72)
Vitamin D2 1, 25 (OH)2: <8 pg/mL
Vitamin D3 1, 25 (OH)2: 62 pg/mL

## 2012-11-28 ENCOUNTER — Ambulatory Visit (INDEPENDENT_AMBULATORY_CARE_PROVIDER_SITE_OTHER): Payer: BC Managed Care – PPO | Admitting: Cardiovascular Disease

## 2012-11-29 ENCOUNTER — Encounter: Payer: Self-pay | Admitting: Cardiovascular Disease

## 2012-11-29 ENCOUNTER — Ambulatory Visit (INDEPENDENT_AMBULATORY_CARE_PROVIDER_SITE_OTHER): Payer: BC Managed Care – PPO | Admitting: Cardiovascular Disease

## 2012-11-29 VITALS — BP 122/82 | HR 88 | Ht 71.0 in | Wt 177.6 lb

## 2012-11-29 DIAGNOSIS — I10 Essential (primary) hypertension: Secondary | ICD-10-CM

## 2012-11-29 DIAGNOSIS — R5383 Other fatigue: Secondary | ICD-10-CM

## 2012-11-29 DIAGNOSIS — E785 Hyperlipidemia, unspecified: Secondary | ICD-10-CM

## 2012-11-29 DIAGNOSIS — R5381 Other malaise: Secondary | ICD-10-CM

## 2012-11-29 DIAGNOSIS — Z79899 Other long term (current) drug therapy: Secondary | ICD-10-CM

## 2012-11-29 DIAGNOSIS — E781 Pure hyperglyceridemia: Secondary | ICD-10-CM

## 2012-11-29 DIAGNOSIS — R002 Palpitations: Secondary | ICD-10-CM

## 2012-11-29 NOTE — Patient Instructions (Addendum)
Your physician recommends that you return for lab work in: 3 months.  Your physician has recommended you make the following change in your medication: Double your fish oil.  Your physician recommends that you schedule a follow-up appointment in: 3 months

## 2012-12-08 ENCOUNTER — Encounter: Payer: Self-pay | Admitting: Cardiovascular Disease

## 2012-12-08 NOTE — Progress Notes (Signed)
Patient ID: [name] [name], male   DOB: [birth year], 57 y.o.   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 [name] [name] and [name] [name] in the past.  I first saw him on 10/27/2012 when he presented to establish cardiology care with me and expressed his interest in aggressive preventive cardiology assessment. He has a strong family history for heart disease in both parents as well as grandparents. He does have a history of hypertension and hyperlipidemia.  Remotely had been followed by Dr. Patty [name] and had undergone several myocardial perfusion studies.  In 2006 and 2007 he also underwent several [facility] heart lab evaluations. In September 2006 his cholesterol was 188, LDL 90, HDL 40 but his triglycerides were 292. He is LDL III a + b.\ percent was significantly increased at 50.5. HDL2b  percent was low at 11. Most recently, he has been on statin plus zetia for lipid lowering therapy. He started to see Dr. [name] after 2008. A chest CT  which showed a calcium score 194. He did have mild coronary obstructive disease with less than 50% calcific lesion noted in the mid LAD and less than 30% calcific disease in the proximal and mid right coronary artery.  Subsequently, he has undergone an exercise stress echo study in November 2011 which was normal. The patient has remained asymptomatic and most of his studies were screening evaluations for CAD. Since Dr. [name] has left his practice in [place], [name] [name] has selected me to pursue his aggressive preventive cardiology evaluation.  [name] [name] remains very physically active. He exercises regularly and works out with a trainer at least 2 days per week. This past year he skied both at [place], [place] and [place], [place] without cardiovascular problems. He also does a ladder protocol on the elliptical trainer he has a 7 handicap in golf. He apparently was seen in the emergency room at [facility] for some  symptoms of chest sensation in October 2013.   When I saw him initially, I recommended a complete set of laboratory be undertaken including an NMR lipoprotein of with lipid panel. In addition I also recommended that he undergo a cardiopulmonary test to assess for not only macrovascular angina but potential microvascular ischemia or endothelial dysfunction findings. Cardiopulmonary met test was done on 11/21/2012. This revealed a slightly reduced peak maximum oxygen consumption 80% of predicted he did have a low peak stroke volume. His anaerobic threshold was in the normal range at 13.7 mL's per kilogram per minute. Abnormal pulmonary status. Laboratory revealed a fasting glucose of 105. LDL cholesterol is excellent at 61 with an LDL particle number 01/13/1996 however, his triglycerides were still elevated at 325 and increased large L. VLDL of 9.5 His LDL particles were 631. Had a normal HDL of 46 and normal HDL particle number 39.4. His insulin resistance score was increased at 57. This [place] did obtain fistula which he states contains 980 mg of omega 3 fatty acids out of a total of 1400 mg capsule. He denies any symptoms of chest pressure or shortness of breath  Past Medical History  Diagnosis Date  . Coronary artery disease (CAD) excluded     status post cardiac CT scan with only mild plaquing and no obstructive lesions 2006status post stress echo, ejection fraction 55% able to achieved 10  METS  . Hypertriglyceridemia   . Dyslipidemia   . History of MRSA infection   . Back problem  Past Surgical History  Procedure Laterality Date  . Back surgery      No Known Allergies  Current Outpatient Prescriptions  Medication Sig Dispense Refill  . amLODipine-benazepril (LOTREL) 5-10 MG per capsule Take 1 capsule by mouth daily.  30 capsule  11  . aspirin 81 MG EC tablet Take 81 mg by mouth daily.        Marland Kitchen atenolol (TENORMIN) 50 MG tablet Take 0.5 tablets (25 mg total) by mouth daily.  15 tablet   11  . atorvastatin (LIPITOR) 80 MG tablet TAKE 1/2 TABLET (40 MG TOTAL) BY MOUTH DAILY.  15 tablet  5  . Dutasteride-Tamsulosin HCl (JALYN) 0.5-0.4 MG CAPS Take 1 capsule by mouth daily.        Marland Kitchen ezetimibe (ZETIA) 10 MG tablet Take 1 tablet (10 mg total) by mouth daily.  90 tablet  3  . hydrochlorothiazide (HYDRODIURIL) 25 MG tablet Take 0.5 tablets (12.5 mg total) by mouth daily.  15 tablet  11  . Multiple Vitamin (MULTIVITAMIN) capsule Take 1 capsule by mouth daily.         No current facility-administered medications for this visit.   Socially, he is married for 28 years. He works in Metallurgist. He did his undergraduate degree at [employer]. 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 There is no tobacco history. He does drink alcohol socially. He remains active with athletics.  Family History  Problem Relation Age of Onset  . Heart disease    . Hypertension    . Stroke    . Heart attack Father   . Heart attack Mother   . Hypertension Mother   . Hypertension Father   . Hyperlipidemia Father   . Hyperlipidemia Mother     ROS is negative for fever chills or night sweats. He denies any palpitations. He does have a history of hypertension and elevated lipids. He denies presyncope or syncope. He denies PND orthopnea. He is unaware of any significant change in exercise tolerance. He denies bleeding appear he denies cough or pain. He denies change in GU symptoms or GI symptoms. He denies claudication. He denies paresthesias. He denies edema.  PE BP 122/82  Pulse 88  Ht 5\' 11"  (1.803 m)  Wt 177 lb 9.6 oz (80.559 kg)  BMI 24.78 kg/m2 General: Alert, oriented, no distress.  HEENT: Normocephalic, atraumatic. Pupils round and reactive; sclera anicteric; Fundi normal Nose without nasal septal hypertrophy Mouth/Parynx benign; Mallinpatti scale 2 Neck: No JVD, no carotid briuts Lungs: clear to ausculatation and percussion; no wheezing or rales Heart: RRR, s1  s2 normal no s3. No murmur, gallop, rubs or heave. Abdomen: soft, nontender; no hepatosplenomehaly, BS+; abdominal aorta nontender and not dilated by palpation. Pulses 2+ Extremities: no clubbinbg cyanosis or edema, Homan's sign negative  Neurologic: grossly nonfocal    ECG: Normal sinus rhythm with voltage criteria for LVH.  LABS:  BMET    Component Value Date/Time   NA 137 11/21/2012 0953   K 3.9 11/21/2012 0953   CL 101 11/21/2012 0953   CO2 26 11/21/2012 0953   GLUCOSE 105* 11/21/2012 0953   BUN 13 11/21/2012 0953   CREATININE 1.01 11/21/2012 0953   CREATININE 0.92 02/04/2012 1530   CALCIUM 9.8 11/21/2012 0953   GFRNONAA >90 02/04/2012 1530   GFRAA >90 02/04/2012 1530     Hepatic Function Panel     Component Value Date/Time   PROT 7.7 11/21/2012 0953     CBC  Component Value Date/Time   WBC 6.8 11/21/2012 0953   RBC 5.08 11/21/2012 0953   HGB 16.1 11/21/2012 0953   HCT 45.7 11/21/2012 0953   PLT 206 11/21/2012 0953   MCV 90.0 11/21/2012 0953   MCH 31.7 11/21/2012 0953   MCHC 35.2 11/21/2012 0953   RDW 14.4 11/21/2012 0953   LYMPHSABS 1.2 02/04/2012 1530   MONOABS 0.4 02/04/2012 1530   EOSABS 0.0 02/04/2012 1530   BASOSABS 0.0 02/04/2012 1530     BNP No results found for this basename: probnp    Lipid Panel  No results found for this basename: chol,  trig,  hdl,  cholhdl,  vldl,  ldlcalc     RADIOLOGY: No results found.   ASSESSMENT AND PLAN:  A long discussion with [name] [name]. In the past, he has been found on CT angiography to have mild coronary calcification and nonobstructive stenoses appeared he has had normal myocardial perfusion studies and stress echo studies in the past. His most recent cardiopulmonary neck to demonstrates slightly impaired low-normal peak maximum Hodgkin's consumption. He states that when he was exercising at peak he felt that he was having difficulty with the mask and this may have resulted in some of the little result he did have a normal  heart rate response to exercise but at peak stress he did have somewhat attenuated stroke-volume measurement for his certainly possible that this may be due to microvascular disease versus his previous documentation of nonobstructive coronary disease causing some endothelial dysfunction. We discussed the addition of L-arginine to his medical regimen which may result in increased nitric oxide production. I have recommended further titration and aggressive treatment of his triglyceride to target less than 150. He is already on statin and zetia combination therapy. 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 In 3 months I will repeat an NMR lipoprofile and will also check a insulin level as well as a comprehensivef metabolic panel. I will see him in 4 months for followup evaluation.   Lennette Bihari, MD, [facility] 12/08/2012 5:22 PM

## 2012-12-13 ENCOUNTER — Encounter: Payer: Self-pay | Admitting: Cardiovascular Disease

## 2012-12-31 ENCOUNTER — Other Ambulatory Visit: Payer: Self-pay | Admitting: Cardiology

## 2013-01-02 ENCOUNTER — Other Ambulatory Visit: Payer: Self-pay | Admitting: *Deleted

## 2013-01-02 MED ORDER — ATENOLOL 50 MG PO TABS: 12.5000 mg | ORAL_TABLET | Freq: Every day | ORAL | Status: DC

## 2013-01-25 ENCOUNTER — Telehealth: Payer: Self-pay | Admitting: Cardiovascular Disease

## 2013-01-25 NOTE — Telephone Encounter (Signed)
Also advised to have the lab call the office if any problems the day he goes to get labs drawn.  Pt agreed.

## 2013-01-25 NOTE — Telephone Encounter (Signed)
Please order lab work for patient---hs has an appointment with Dr. Tresa Endo on 03/07/13.  Please send the request to the [facility] that is in our building.

## 2013-01-25 NOTE — Telephone Encounter (Signed)
Returned call.  Pt stated he cannot find the papers he may have been given for his labs when he was last here.  Pt informed labs already ordered and he just needs to present to the lab to have drawn.  Pt verbalized understanding and agreed w/ plan.

## 2013-01-30 ENCOUNTER — Other Ambulatory Visit: Payer: Self-pay | Admitting: Cardiology

## 2013-03-04 ENCOUNTER — Other Ambulatory Visit: Payer: Self-pay | Admitting: Physician Assistant

## 2013-03-06 ENCOUNTER — Other Ambulatory Visit: Payer: Self-pay | Admitting: *Deleted

## 2013-03-06 MED ORDER — ATORVASTATIN CALCIUM 80 MG PO TABS: 40.0000 mg | ORAL_TABLET | Freq: Every day | ORAL | Status: DC

## 2013-03-06 NOTE — Telephone Encounter (Signed)
[facility] patient

## 2013-03-07 ENCOUNTER — Ambulatory Visit: Payer: BC Managed Care – PPO | Admitting: Cardiovascular Disease

## 2013-03-28 ENCOUNTER — Other Ambulatory Visit: Payer: Self-pay | Admitting: Cardiovascular Disease

## 2013-03-28 LAB — COMPREHENSIVE METABOLIC PANEL
ALT: 30 U/L (ref 0–53)
AST: 25 U/L (ref 0–37)
Albumin: 4.5 g/dL (ref 3.5–5.2)
Alkaline Phosphatase: 62 U/L (ref 39–117)
BUN: 12 mg/dL (ref 6–23)
CO2: 28 mEq/L (ref 19–32)
Calcium: 9.2 mg/dL (ref 8.4–10.5)
Chloride: 104 mEq/L (ref 96–112)
Creat: 0.92 mg/dL (ref 0.50–1.35)
Glucose, Bld: 102 mg/dL — ABNORMAL HIGH (ref 70–99)
Potassium: 3.8 mEq/L (ref 3.5–5.3)
Sodium: 141 mEq/L (ref 135–145)
Total Bilirubin: 1.3 mg/dL — ABNORMAL HIGH (ref 0.3–1.2)
Total Protein: 7 g/dL (ref 6.0–8.3)

## 2013-03-28 LAB — CBC
HCT: 42.8 % (ref 39.0–52.0)
Hemoglobin: 15.2 g/dL (ref 13.0–17.0)
MCH: 32.3 pg (ref 26.0–34.0)
MCHC: 35.5 g/dL (ref 30.0–36.0)
MCV: 91.1 fL (ref 78.0–100.0)
Platelets: 209 10*3/uL (ref 150–400)
RBC: 4.7 MIL/uL (ref 4.22–5.81)
RDW: 13.8 % (ref 11.5–15.5)
WBC: 6.2 10*3/uL (ref 4.0–10.5)

## 2013-03-29 LAB — NMR LIPOPROFILE WITH LIPIDS
Cholesterol, Total: 144 mg/dL (ref <200)
HDL Particle Number: 41.1 umol/L (ref >=30.5)
HDL Size: 9.4 nm (ref >=9.2)
HDL-C: 47 mg/dL (ref >=40)
LDL (calc): 63 mg/dL (ref <100)
LDL Particle Number: 824 nmol/L (ref <1000)
LDL Size: 19.7 nm — ABNORMAL LOW (ref >20.5)
LP-IR Score: 52 — ABNORMAL HIGH (ref <=45)
Large HDL-P: 6.9 umol/L (ref >=4.8)
Large VLDL-P: 6.4 nmol/L — ABNORMAL HIGH (ref <=2.7)
Small LDL Particle Number: 654 nmol/L — ABNORMAL HIGH (ref <=527)
Triglycerides: 169 mg/dL — ABNORMAL HIGH (ref <150)
VLDL Size: 49.3 nm — ABNORMAL HIGH (ref <=46.6)

## 2013-03-30 ENCOUNTER — Encounter: Payer: Self-pay | Admitting: Cardiovascular Disease

## 2013-03-30 ENCOUNTER — Ambulatory Visit (INDEPENDENT_AMBULATORY_CARE_PROVIDER_SITE_OTHER): Payer: BC Managed Care – PPO | Admitting: Cardiovascular Disease

## 2013-03-30 VITALS — BP 150/90 | HR 60 | Ht 70.0 in | Wt 175.7 lb

## 2013-03-30 DIAGNOSIS — E785 Hyperlipidemia, unspecified: Secondary | ICD-10-CM

## 2013-03-30 DIAGNOSIS — I2510 Atherosclerotic heart disease of native coronary artery without angina pectoris: Secondary | ICD-10-CM

## 2013-03-30 DIAGNOSIS — I10 Essential (primary) hypertension: Secondary | ICD-10-CM

## 2013-03-30 NOTE — Patient Instructions (Signed)
Your physician recommends that you schedule a follow-up appointment in: 6 months  Your physician recommends that you return for lab work in: 6 months CMP,NMR lipids, TSH

## 2013-04-13 ENCOUNTER — Other Ambulatory Visit: Payer: Self-pay

## 2013-04-24 ENCOUNTER — Encounter: Payer: Self-pay | Admitting: Cardiovascular Disease

## 2013-04-24 NOTE — Progress Notes (Addendum)
Patient ID: [name] [name], male   DOB: Mar 29, 1956, 57 y.o.   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 Brackbill and deGent in the past.  I first saw him on 10/27/2012 when he presented to establish cardiology care with me and expressed his interest in aggressive preventive cardiology assessment. He has a strong family history for heart disease in both parents as well as grandparents. He does have a history of hypertension and hyperlipidemia.  Remotely had been followed by Dr. Patty Sermons and had undergone several myocardial perfusion studies.  In 2006 and 2007 he also underwent several [facility] heart lab evaluations. In September 2006 his cholesterol was 188, LDL 90, HDL 40 but his triglycerides were 292. He is LDL III a + b.\ percent was significantly increased at 50.5. HDL2b  percent was low at 11. Most recently, he has been on statin plus zetia for lipid lowering therapy. In 2008 he began to be followed by Dr. Candee Furbish. A chest CT revealed a calcium score 194. He did have mild coronary obstructive disease with less than 50% calcific lesion noted in the mid LAD and less than 30% calcific disease in the proximal and mid right coronary artery.  Subsequently, he has undergone an exercise stress echo study in November 2011 which was normal. The patient has remained asymptomatic and most of his studies were screening evaluations for CAD. Since Dr. Candee Furbish has left his practice in [place], Mr. Tejada has selected me to pursue his aggressive preventive cardiology evaluation.  Mr. [name] [name] remains very physically active. He exercises regularly and works out with a trainer at least 2 days per week. This past year he skied both at [place], [place] and [place], [place] without cardiovascular problems. He also does a ladder protocol on the elliptical trainer he has a 7 handicap in golf. He apparently was seen in the emergency room at [facility] for  some symptoms of chest sensation in October 2013.   When I saw him initially, I recommended a complete set of laboratory be undertaken including an NMR lipoprotein of with lipid panel. In addition I also recommended that he undergo a cardiopulmonary met test to assess for not only macrovascular angina but potential microvascular ischemia or endothelial dysfunction findings. Cardiopulmonary met test was done on 11/21/2012. This revealed a slightly reduced peak maximum oxygen consumption 80% of predicted he did have a low peak stroke volume. His anaerobic threshold was in the normal range at 13.7 mL's per kilogram per minute. Abnormal pulmonary status. Laboratory revealed a fasting glucose of 105. LDL cholesterol is excellent at 61 with an LDL particle number 01/13/1996 however, his triglycerides were still elevated at 325 and increased large VLDL of 9.5 His LDL particles were 631. Had a normal HDL of 46 and normal HDL particle number 39.4. His insulin resistance score was increased at 57. This [place] did obtain fish oil which he states contains 980 mg of omega 3 fatty acids out of a total of 1400 mg capsule. He denies any symptoms of chest pressure or shortness of breath.  On 03/28/2013, repeat laboratory revealed a fasting glucose of 102. Bilirubin was 1.3. He had normal SGOT and SGPT. LDL particle number was excellent at 824, LDL 63, triglycerides were markedly improved but still minimally elevated at 169. Large LDL was also mildly elevated at 6.4 as was small LDL particle number at 654. HDL cholesterol was 47. Insulin resistance score  was somewhat improved but again still mildly elevated at 52.  Past Medical History  Diagnosis Date  . Coronary artery disease (CAD) excluded     status post cardiac CT scan with only mild plaquing and no obstructive lesions 2006status post stress echo, ejection fraction 55% able to achieved 10  METS  . Hypertriglyceridemia   . Dyslipidemia   . History of MRSA infection     . Back problem     Past Surgical History  Procedure Laterality Date  . Back surgery      No Known Allergies  Current Outpatient Prescriptions  Medication Sig Dispense Refill  . amLODipine-benazepril (LOTREL) 5-10 MG per capsule TAKE 1 CAPSULE BY MOUTH DAILY.  30 capsule  6  . aspirin 81 MG EC tablet Take 81 mg by mouth daily.        Marland Kitchen atenolol (TENORMIN) 50 MG tablet Take 50 mg by mouth. Takes 1/4 tablet daily (12.5 mg)      . atorvastatin (LIPITOR) 80 MG tablet Take 0.5 tablets (40 mg total) by mouth daily.  15 tablet  5  . Dutasteride-Tamsulosin HCl (JALYN) 0.5-0.4 MG CAPS Take 1 capsule by mouth daily.        Marland Kitchen ezetimibe (ZETIA) 10 MG tablet Take 1 tablet (10 mg total) by mouth daily.  90 tablet  3  . Multiple Vitamin (MULTIVITAMIN) capsule Take 1 capsule by mouth daily.        . Omega-3 1400 MG CAPS Take 2 capsules by mouth daily.      . hydrochlorothiazide (HYDRODIURIL) 25 MG tablet Take 25 mg by mouth. Patient takes 1/4 tablet daily       No current facility-administered medications for this visit.   Socially, he is married for 28 years. He works in Metallurgist. He did his undergraduate degree at [employer]. 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 There is no tobacco history. He does drink alcohol socially. He remains active with athletics.  Family History  Problem Relation Age of Onset  . Heart disease    . Hypertension    . Stroke    . Heart attack Father   . Heart attack Mother   . Hypertension Mother   . Hypertension Father   . Hyperlipidemia Father   . Hyperlipidemia Mother     ROS is negative for fever chills or night sweats. He denies any palpitations. He does have a history of hypertension and elevated lipids. He denies presyncope or syncope. He denies PND orthopnea. He is unaware of any significant change in exercise tolerance. He denies bleeding appear he denies cough or pain. He denies change in GU symptoms or GI symptoms. He  denies claudication. He denies paresthesias. He denies heat or cold intolerance. He denies any overt diabetes. He denies edema.  PE BP 150/90  Pulse 60  Ht 5\' 10"  (1.778 m)  Wt 175 lb 11.2 oz (79.697 kg)  BMI 25.21 kg/m2 General: Alert, oriented, no distress.  HEENT: Normocephalic, atraumatic. Pupils round and reactive; sclera anicteric; Fundi normal Nose without nasal septal hypertrophy Mouth/Parynx benign; Mallinpatti scale 2 Neck: No JVD, no carotid briuts Lungs: clear to ausculatation and percussion; no wheezing or rales Heart: RRR, s1 s2 normal no s3. No murmur, gallop, rubs or heave. Abdomen: soft, nontender; no hepatosplenomehaly, BS+; abdominal aorta nontender and not dilated by palpation. Pulses 2+ Extremities: no clubbinbg cyanosis or edema, Homan's sign negative  Neurologic: grossly nonfocal    ECG: Normal sinus rhythm  at 60 with voltage criteria for LVH.  LABS:  BMET    Component Value Date/Time   NA 141 03/28/2013 0941   K 3.8 03/28/2013 0941   CL 104 03/28/2013 0941   CO2 28 03/28/2013 0941   GLUCOSE 102* 03/28/2013 0941   BUN 12 03/28/2013 0941   CREATININE 0.92 03/28/2013 0941   CREATININE 0.92 02/04/2012 1530   CALCIUM 9.2 03/28/2013 0941   GFRNONAA >90 02/04/2012 1530   GFRAA >90 02/04/2012 1530     Hepatic Function Panel     Component Value Date/Time   PROT 7.0 03/28/2013 0941     CBC    Component Value Date/Time   WBC 6.2 03/28/2013 0941   RBC 4.70 03/28/2013 0941   HGB 15.2 03/28/2013 0941   HCT 42.8 03/28/2013 0941   PLT 209 03/28/2013 0941   MCV 91.1 03/28/2013 0941   MCH 32.3 03/28/2013 0941   MCHC 35.5 03/28/2013 0941   RDW 13.8 03/28/2013 0941   LYMPHSABS 1.2 02/04/2012 1530   MONOABS 0.4 02/04/2012 1530   EOSABS 0.0 02/04/2012 1530   BASOSABS 0.0 02/04/2012 1530     BNP No results found for this basename: probnp    Lipid Panel  No results found for this basename: chol,  trig,  hdl,  cholhdl,  vldl,  ldlcalc      RADIOLOGY: No results found.   ASSESSMENT AND PLAN:   Mr. Kling has  mild coronary calcification and nonobstructive stenoses demonstrated on a prior chest CT in 2008.  He has had normal myocardial perfusion studies and stress echo studies in the past. His most recent cardiopulmonary met test revealed slightly impaired low-normal peak maximum oxygen consumption. His most recent NMR profile is improved his triglycerides out 169 down from greater than 300 with the addition of alpha 3 omega  fatty acids to his medical regimen. His LDL particle number is excellent at  824. He will continue with exercise and diet optimization. He remains asymptomatic. In 6 months we'll repeat a  comprehensive metabolic panel, TSH, and NMR profile.  Lennette Bihari, MD, [facility] 04/24/2013 5:48 PM

## 2013-04-25 ENCOUNTER — Encounter: Payer: Self-pay | Admitting: Cardiovascular Disease

## 2013-08-31 ENCOUNTER — Other Ambulatory Visit: Payer: Self-pay | Admitting: *Deleted

## 2013-08-31 MED ORDER — AMLODIPINE BESY-BENAZEPRIL HCL 5-10 MG PO CAPS: ORAL_CAPSULE | ORAL | Status: DC

## 2013-08-31 NOTE — Telephone Encounter (Signed)
Rx was sent to pharmacy electronically. 

## 2013-09-06 ENCOUNTER — Telehealth: Payer: Self-pay | Admitting: Cardiovascular Disease

## 2013-09-06 DIAGNOSIS — R5381 Other malaise: Secondary | ICD-10-CM

## 2013-09-06 DIAGNOSIS — E782 Mixed hyperlipidemia: Secondary | ICD-10-CM

## 2013-09-06 DIAGNOSIS — Z79899 Other long term (current) drug therapy: Secondary | ICD-10-CM

## 2013-09-06 DIAGNOSIS — R5383 Other fatigue: Secondary | ICD-10-CM

## 2013-09-06 NOTE — Telephone Encounter (Signed)
Lab(s) ordered and Lab slip mailed.  

## 2013-09-06 NOTE — Telephone Encounter (Signed)
Is asking for a lab order to be mailed to him before his 9mth f/u on 10/10/2013..  Thanks

## 2013-10-02 ENCOUNTER — Encounter: Payer: BC Managed Care – PPO | Attending: Family Medicine | Admitting: Dietician

## 2013-10-02 ENCOUNTER — Encounter: Payer: Self-pay | Admitting: Dietician

## 2013-10-02 VITALS — Ht 70.0 in | Wt 179.9 lb

## 2013-10-02 DIAGNOSIS — R7301 Impaired fasting glucose: Secondary | ICD-10-CM | POA: Insufficient documentation

## 2013-10-02 DIAGNOSIS — Z713 Dietary counseling and surveillance: Secondary | ICD-10-CM | POA: Insufficient documentation

## 2013-10-02 NOTE — Patient Instructions (Addendum)
Pair proteins with carbohydrates for meals and snacks. Choose whole wheat English Muffins/Bread for breakfast - have with peanut butter, nuts, eggs.  Consider drinking mostly water instead of diet drinks.  Continue exercising 5-6 x week.  Aim to have vegetables at lunch and dinner. Choose unsaturated that are from fish/plants (nuts, olive oil, seeds, fish) instead of saturated fats (solid and from animals).

## 2013-10-02 NOTE — Progress Notes (Signed)
  Medical Nutrition Therapy:  Appt start time: 0745 end time:  0845.   Assessment:  Primary concerns today: [name] [name] is here today since he has been making a lot of changes to his diet to decrease triglycerides (325) and fasting glucose in June. Starting decreasing carbs and bread, increasing salad, and increasing protein (having 100-125 g carbs per day). Triglycerides went down significantly and glucose went down a little from 105 to 102 mg/dl. Hgb A1c is 4.5% in April. Has a glucometer since sugar was slightly elevated. Limit eggs to 2 x week. Has 4-5 beef once every 2 weeks. Exercises about 5-6 x week. Lost 6-7 lbs since June.   He lives in [place] and works in [place]. Works as a Chief of Staff (Emergency planning/management officer). Eats most meals outside the home. Will cook 1-2 x week.   Preferred Learning Style:   No preference indicated   Learning Readiness:   Ready  MEDICATIONS: see list   DIETARY INTAKE:  Avoided foods include: broccoli, asparagus, spinach, kale   24-hr recall:  B ( AM): water, G2, diet sundrop (52 oz), croissant egg, and cheese, or bagel or oatmeal Snk ( AM): G2 with nuts or protein bar or fruit L ( PM): chili over salad or grilled salmon, chicken  Snk ( PM): G2, water, nuts or fruit D ( PM): grilled chicken philly sub (8 in), 1-2 drinks (light beer or cocktail with diet drink), salad, spaghetti Snk ( PM): whoppers (candy) with cashews or carmel candy with nuts Beverages: water, G2, diet sundrop  Usual physical activity: 15 minute stretching, trainer and Monday and Thursday (weights), elliptical (35 minutes), golfing (5-6 x week)  Estimated energy needs: 200 calories 225 g carbohydrates 150 g protein 56 g fat  Progress Towards Goal(s):  In progress.   Nutritional Diagnosis:  Cass-2.1 Inpaired nutrition utilization As related to elevated fasting glucose and hx of Lipitor use.  As evidenced by fasting glucose is 102 mg/l.    Intervention:  Nutrition counseling  provided. Plan: Pair proteins with carbohydrates for meals and snacks. Choose whole wheat English Muffins/Bread for breakfast - have with peanut butter, nuts, eggs.  Consider drinking mostly water instead of diet drinks.  Continue exercising 5-6 x week.  Aim to have vegetables at lunch and dinner. Choose unsaturated that are from fish/plants (nuts, olive oil, seeds, fish) instead of saturated fats (solid and from animals).   Teaching Method Utilized:  Visual Auditory Hands on  Handouts given during visit include:  Living Well with Diabetes  Yellow Card  Blood Glucose Monitoring  Barriers to learning/adherence to lifestyle change: none  Demonstrated degree of understanding via:  Teach Back   Monitoring/Evaluation:  Dietary intake, exercise, and body weight prn.

## 2013-10-06 LAB — COMPREHENSIVE METABOLIC PANEL
ALT: 33 U/L (ref 0–53)
AST: 23 U/L (ref 0–37)
Albumin: 4.5 g/dL (ref 3.5–5.2)
Alkaline Phosphatase: 64 U/L (ref 39–117)
BUN: 14 mg/dL (ref 6–23)
CO2: 28 mEq/L (ref 19–32)
Calcium: 9.9 mg/dL (ref 8.4–10.5)
Chloride: 105 mEq/L (ref 96–112)
Creat: 0.9 mg/dL (ref 0.50–1.35)
Glucose, Bld: 98 mg/dL (ref 70–99)
Potassium: 4.3 mEq/L (ref 3.5–5.3)
Sodium: 142 mEq/L (ref 135–145)
Total Bilirubin: 1.3 mg/dL — ABNORMAL HIGH (ref 0.2–1.2)
Total Protein: 7.1 g/dL (ref 6.0–8.3)

## 2013-10-06 LAB — NMR LIPOPROFILE WITH LIPIDS
Cholesterol, Total: 145 mg/dL (ref <200)
HDL Particle Number: 38.2 umol/L (ref >=30.5)
HDL Size: 9.3 nm (ref >=9.2)
HDL-C: 51 mg/dL (ref >=40)
LDL (calc): 61 mg/dL (ref <100)
LDL Particle Number: 1057 nmol/L — ABNORMAL HIGH (ref <1000)
LDL Size: 19.6 nm — ABNORMAL LOW (ref >20.5)
LP-IR Score: 46 — ABNORMAL HIGH (ref <=45)
Large HDL-P: 5.9 umol/L (ref >=4.8)
Large VLDL-P: 2.7 nmol/L (ref <=2.7)
Small LDL Particle Number: 858 nmol/L — ABNORMAL HIGH (ref <=527)
Triglycerides: 163 mg/dL — ABNORMAL HIGH (ref <150)
VLDL Size: 45.5 nm (ref <=46.6)

## 2013-10-06 LAB — TSH: TSH: 1.112 u[IU]/mL (ref 0.350–4.500)

## 2013-10-10 ENCOUNTER — Ambulatory Visit (INDEPENDENT_AMBULATORY_CARE_PROVIDER_SITE_OTHER): Payer: BC Managed Care – PPO | Admitting: Cardiovascular Disease

## 2013-10-10 VITALS — BP 148/78 | HR 57 | Ht 70.0 in | Wt 174.9 lb

## 2013-10-10 DIAGNOSIS — E781 Pure hyperglyceridemia: Secondary | ICD-10-CM

## 2013-10-10 DIAGNOSIS — M129 Arthropathy, unspecified: Secondary | ICD-10-CM

## 2013-10-10 DIAGNOSIS — I10 Essential (primary) hypertension: Secondary | ICD-10-CM

## 2013-10-10 DIAGNOSIS — M1990 Unspecified osteoarthritis, unspecified site: Secondary | ICD-10-CM

## 2013-10-10 DIAGNOSIS — E785 Hyperlipidemia, unspecified: Secondary | ICD-10-CM

## 2013-10-10 DIAGNOSIS — Z0389 Encounter for observation for other suspected diseases and conditions ruled out: Secondary | ICD-10-CM

## 2013-10-10 NOTE — Patient Instructions (Signed)
Your physician recommends that you schedule a follow-up appointment and blood work in 6 months.

## 2013-10-21 ENCOUNTER — Encounter: Payer: Self-pay | Admitting: Cardiovascular Disease

## 2013-10-21 NOTE — Progress Notes (Signed)
Patient ID: [name] [name], male   DOB: 18-Feb-1956, 58 y.o.   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 Brackbill and deGent in the past.  I first saw him on 10/27/2012 when he presented to establish cardiology care with me and expressed his interest in aggressive preventive cardiology assessment. He has a strong family history for heart disease in both parents as well as grandparents. He does have a history of hypertension and hyperlipidemia.  Remotely had been followed by Dr. Mare Ferrari and had undergone several myocardial perfusion studies.  In 2006 and 2007 he also underwent several [facility] heart lab evaluations. In September 2006 his cholesterol was 188, LDL 90, HDL 40 but his triglycerides were 292. He is LDL III a + b.\ percent was significantly increased at 50.5. HDL2b  percent was low at 11. Most recently, he has been on statin plus zetia for lipid lowering therapy. In 2008 he began to be followed by Dr. Lesle Reek. A chest CT revealed a calcium score 194. He did have mild coronary obstructive disease with less than 50% calcific lesion noted in the mid LAD and less than 30% calcific disease in the proximal and mid right coronary artery.  Subsequently, he has undergone an exercise stress echo study in November 2011 which was normal. The patient has remained asymptomatic and most of his studies were screening evaluations for CAD. Since Dr. Lesle Reek has left his practice in [place], [name] [name] has selected me to pursue his aggressive preventive cardiology evaluation.  [name] [name] remains very physically active. He exercises regularly and works out with a trainer at least 2 days per week. This past year he skied both at [place], [place] and [place], [place] without cardiovascular problems. He also does a ladder protocol on the elliptical trainer he has a 7 handicap in golf. He apparently was seen in the emergency room at [facility] for  some symptoms of chest sensation in October 2013.   When I saw him initially, I recommended a complete set of laboratory be undertaken including an NMR lipoprotein of with lipid panel. In addition I also recommended that he undergo a cardiopulmonary met test to assess for not only macrovascular angina but potential microvascular ischemia or endothelial dysfunction findings. Cardiopulmonary met test was done on 11/21/2012. This revealed a slightly reduced peak maximum oxygen consumption 80% of predicted he did have a low peak stroke volume. His anaerobic threshold was in the normal range at 13.7 mL's per kilogram per minute. Abnormal pulmonary status. Laboratory revealed a fasting glucose of 105. LDL cholesterol is excellent at 61 with an LDL particle number 01/13/1996 however, his triglycerides were still elevated at 325 and increased large VLDL of 9.5 His LDL particles were 631. Had a normal HDL of 46 and normal HDL particle number 39.4. His insulin resistance score was increased at 57. He  did obtain fish oil which he states contains 980 mg of omega 3 fatty acids out of a total of 1400 mg capsule. He denies any symptoms of chest pressure or shortness of breath.  On 03/28/2013, laboratory revealed a fasting glucose of 102. Bilirubin was 1.3. He had normal SGOT and SGPT. LDL particle number was excellent at 824, LDL 63, triglycerides were markedly improved but still minimally elevated at 169. Large LDL was also mildly elevated at 6.4 as was small LDL particle number at 654. HDL cholesterol was 47. Insulin resistance score  was somewhat improved but again still mildly elevated at 52.  Over the past 6 months, he continues to remain asymptomatic.  Recently had difficulties with allergies and completed a course of Z-Pak and steroid taper.  Subsequent blood work demonstrated a total cholesterol 145, triglycerides 163, HDL cholesterol 51, LDL cholesterol 61.  However, he did have increased.  Small LDL particles and  858 and her into a very mildly increased LDL particle number of 1057.  He had normal liver function studies with the exception of a bilirubin, which was 1.3.  Renal function was normal.  TSH was normal at 1.12.  He remains active.  Past Medical History  Diagnosis Date  . Coronary artery disease (CAD) excluded     status post cardiac CT scan with only mild plaquing and no obstructive lesions 2006status post stress echo, ejection fraction 55% able to achieved 10  METS  . Hypertriglyceridemia   . Dyslipidemia   . History of MRSA infection   . Back problem     Past Surgical History  Procedure Laterality Date  . Back surgery      No Known Allergies  Current Outpatient Prescriptions  Medication Sig Dispense Refill  . amLODipine-benazepril (LOTREL) 5-10 MG per capsule TAKE 1 CAPSULE BY MOUTH DAILY.  30 capsule  6  . aspirin 81 MG EC tablet Take 81 mg by mouth daily.        Marland Kitchen atenolol (TENORMIN) 50 MG tablet Takes 1/4 tablet daily (12.5 mg)      . atorvastatin (LIPITOR) 80 MG tablet Take 0.5 tablets (40 mg total) by mouth daily.  15 tablet  5  . Dutasteride-Tamsulosin HCl (JALYN) 0.5-0.4 MG CAPS Take 1 capsule by mouth daily.        Marland Kitchen ezetimibe (ZETIA) 10 MG tablet Take 1 tablet (10 mg total) by mouth daily.  90 tablet  3  . Multiple Vitamin (MULTIVITAMIN) capsule Take 1 capsule by mouth daily.        . Omega-3 1400 MG CAPS 2 capsules in the morning and 1capsule in the evening       No current facility-administered medications for this visit.   Socially, he is married for 28 years. He works in Psychologist, forensic. He did his undergraduate degree at [employer]. 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 There is no tobacco history. He does drink alcohol socially. He remains active with athletics.  Family History  Problem Relation Age of Onset  . Heart disease    . Hypertension    . Stroke    . Heart attack Father   . Heart attack Mother   . Hypertension Mother     . Hypertension Father   . Hyperlipidemia Father   . Hyperlipidemia Mother    ROS General: Negative; No fevers, chills, or night sweats; there is no change in weight HEENT: Negative; No changes in vision or hearing, sinus congestion, difficulty swallowing Pulmonary: He is status post recent allergy and treatment with Z-Pak.; No cough, wheezing, shortness of breath, hemoptysis Cardiovascular: Negative; No chest pain, presyncope, syncope, palpatations GI: Negative; No nausea, vomiting, diarrhea, or abdominal pain GU: Negative; No dysuria, hematuria, or difficulty voiding Musculoskeletal: Negative; no myalgias, joint pain, or weakness Hematologic/Oncology: Negative; no easy bruising, bleeding Endocrine: Negative; no heat/cold intolerance; no diabetes Neuro: Negative; no changes in balance, headaches Skin: Negative; No rashes or skin lesions Psychiatric: Negative; No behavioral problems, depression Sleep: Negative; No snoring, daytime sleepiness, hypersomnolence, bruxism, restless legs, hypnogognic  hallucinations, no cataplexy Other comprehensive 14 point system review is negative.   PE BP 148/78  Pulse 57  Ht '5\' 10"'  (1.778 m)  Wt 174 lb 14.4 oz (79.334 kg)  BMI 25.10 kg/m2 General: Alert, oriented, no distress.  HEENT: Normocephalic, atraumatic. Pupils round and reactive; sclera anicteric; Fundi normal Nose without nasal septal hypertrophy Mouth/Parynx benign; Mallinpatti scale 2 Neck: No JVD, no carotid bruits with normal carotid upstroke Lungs: clear to ausculatation and percussion; no wheezing or rales Chest wall: Nontender to palpation Heart: PMI is not displaced RRR, s1 s2 normal no s3. No murmur, gallop, rubs or heave. Abdomen: soft, nontender; no hepatosplenomehaly, BS+; abdominal aorta nontender and not dilated by palpation. Back: No CVA tenderness Pulses 2+ Extremities: no clubbinbg cyanosis or edema, Homan's sign negative  Neurologic: grossly nonfocal Psychological:  Normal affect and mood   ECG (independently read by me) sinus bradycardia 57 beats per minute.  No ectopy.  Normal intervals.  ECG: Normal sinus rhythm at 60 with voltage criteria for LVH.  LABS:  BMET    Component Value Date/Time   NA 142 10/05/2013 0948   K 4.3 10/05/2013 0948   CL 105 10/05/2013 0948   CO2 28 10/05/2013 0948   GLUCOSE 98 10/05/2013 0948   BUN 14 10/05/2013 0948   CREATININE 0.90 10/05/2013 0948   CREATININE 0.92 02/04/2012 1530   CALCIUM 9.9 10/05/2013 0948   GFRNONAA >90 02/04/2012 1530   GFRAA >90 02/04/2012 1530     Hepatic Function Panel     Component Value Date/Time   PROT 7.1 10/05/2013 0948     CBC    Component Value Date/Time   WBC 6.2 03/28/2013 0941   RBC 4.70 03/28/2013 0941   HGB 15.2 03/28/2013 0941   HCT 42.8 03/28/2013 0941   PLT 209 03/28/2013 0941   MCV 91.1 03/28/2013 0941   MCH 32.3 03/28/2013 0941   MCHC 35.5 03/28/2013 0941   RDW 13.8 03/28/2013 0941   LYMPHSABS 1.2 02/04/2012 1530   MONOABS 0.4 02/04/2012 1530   EOSABS 0.0 02/04/2012 1530   BASOSABS 0.0 02/04/2012 1530     BNP No results found for this basename: probnp    Lipid Panel  No results found for this basename: chol,  trig,  hdl,  cholhdl,  vldl,  ldlcalc     RADIOLOGY: No results found.   ASSESSMENT AND PLAN:   Mr. Lembke has  mild coronary calcification and nonobstructive stenoses demonstrated on a prior chest CT in 2008.  He has had normal myocardial perfusion studies and stress echo studies in the past. A cardiopulmonary met test revealed slightly impaired low-normal peak maximum oxygen consumption.  He continues to be asymptomatic and active, exercising regularly.  He is now on blood pressure on repeat by me was 140/78, which is slightly improved from 140/78 when taken by the nurse.  He is on atenolol 12.5 mg in addition to Lotrel 5/10 for blood pressure control.  I reviewed his recent laboratory with him in detail.  Triglycerides remain slightly elevated at 163.   He is not having side effects from his high-dose atorvastatin 80 mg 2 capsules at 1400 mg of omega 3 fish oil and Zetia for his hyperlipidemia.  He is on a baby aspirin for antiplatelet benefit.  He tells me his last hemoglobin A1c was excellent at 4.5.  He wishes to be followed at 6 month intervals.  In 6 months I will repeat a complete set of laboratory.  He is to  contact our office if changes occur.  [name] Sine, MD, [facility] 10/21/2013 11:45 AM

## 2013-11-03 ENCOUNTER — Other Ambulatory Visit: Payer: Self-pay

## 2013-11-03 MED ORDER — ATORVASTATIN CALCIUM 80 MG PO TABS: 40.0000 mg | ORAL_TABLET | Freq: Every day | ORAL | Status: DC

## 2013-11-03 NOTE — Telephone Encounter (Signed)
Rx was sent to pharmacy electronically. 

## 2014-02-03 ENCOUNTER — Other Ambulatory Visit: Payer: Self-pay | Admitting: Cardiovascular Disease

## 2014-02-05 NOTE — Telephone Encounter (Signed)
Rx refill sent to patient pharmacy   

## 2014-03-20 ENCOUNTER — Other Ambulatory Visit: Payer: Self-pay | Admitting: *Deleted

## 2014-03-20 ENCOUNTER — Encounter: Payer: Self-pay | Admitting: *Deleted

## 2014-03-20 DIAGNOSIS — M1990 Unspecified osteoarthritis, unspecified site: Secondary | ICD-10-CM

## 2014-03-20 DIAGNOSIS — I10 Essential (primary) hypertension: Secondary | ICD-10-CM

## 2014-03-20 DIAGNOSIS — E785 Hyperlipidemia, unspecified: Secondary | ICD-10-CM

## 2014-04-04 ENCOUNTER — Other Ambulatory Visit: Payer: Self-pay | Admitting: Cardiovascular Disease

## 2014-04-04 NOTE — Telephone Encounter (Signed)
Rx was sent to pharmacy electronically. 

## 2014-05-10 ENCOUNTER — Ambulatory Visit: Payer: BC Managed Care – PPO | Admitting: Cardiovascular Disease

## 2014-06-04 ENCOUNTER — Ambulatory Visit: Payer: BC Managed Care – PPO | Admitting: Cardiovascular Disease

## 2014-07-05 LAB — CBC
HCT: 43.2 % (ref 39.0–52.0)
Hemoglobin: 15.2 g/dL (ref 13.0–17.0)
MCH: 32.8 pg (ref 26.0–34.0)
MCHC: 35.2 g/dL (ref 30.0–36.0)
MCV: 93.3 fL (ref 78.0–100.0)
Platelets: 228 10*3/uL (ref 150–400)
RBC: 4.63 MIL/uL (ref 4.22–5.81)
RDW: 14.5 % (ref 11.5–15.5)
WBC: 7.2 10*3/uL (ref 4.0–10.5)

## 2014-07-06 LAB — COMPREHENSIVE METABOLIC PANEL
ALT: 25 U/L (ref 0–53)
AST: 23 U/L (ref 0–37)
Albumin: 4.4 g/dL (ref 3.5–5.2)
Alkaline Phosphatase: 68 U/L (ref 39–117)
BUN: 12 mg/dL (ref 6–23)
CO2: 16 mEq/L — ABNORMAL LOW (ref 19–32)
Calcium: 9 mg/dL (ref 8.4–10.5)
Chloride: 106 mEq/L (ref 96–112)
Creat: 0.89 mg/dL (ref 0.50–1.35)
Glucose, Bld: 87 mg/dL (ref 70–99)
Potassium: 4.2 mEq/L (ref 3.5–5.3)
Sodium: 140 mEq/L (ref 135–145)
Total Bilirubin: 1.1 mg/dL (ref 0.2–1.2)
Total Protein: 6.8 g/dL (ref 6.0–8.3)

## 2014-07-06 LAB — C-REACTIVE PROTEIN: CRP: <0.5 mg/dL (ref <0.60)

## 2014-07-07 LAB — NMR LIPOPROFILE WITH LIPIDS
Cholesterol, Total: 187 mg/dL (ref 100–199)
HDL Particle Number: 41.2 umol/L (ref >=30.5)
HDL Size: 8.9 nm — ABNORMAL LOW (ref >=9.2)
HDL-C: 59 mg/dL (ref >39)
LDL (calc): 93 mg/dL (ref 0–99)
LDL Particle Number: 1211 nmol/L — ABNORMAL HIGH (ref <1000)
LDL Size: 20.6 nm (ref >=20.8)
LP-IR Score: 59 — ABNORMAL HIGH (ref <=45)
Large HDL-P: 6.1 umol/L (ref >=4.8)
Large VLDL-P: 4.6 nmol/L — ABNORMAL HIGH (ref <=2.7)
Small LDL Particle Number: 566 nmol/L — ABNORMAL HIGH (ref <=527)
Triglycerides: 173 mg/dL — ABNORMAL HIGH (ref 0–149)
VLDL Size: 47.7 nm — ABNORMAL HIGH (ref <=46.6)

## 2014-07-09 ENCOUNTER — Ambulatory Visit (INDEPENDENT_AMBULATORY_CARE_PROVIDER_SITE_OTHER): Payer: BLUE CROSS/BLUE SHIELD | Admitting: Cardiovascular Disease

## 2014-07-09 VITALS — BP 130/64 | HR 72 | Ht 70.0 in | Wt 174.3 lb

## 2014-07-09 DIAGNOSIS — E785 Hyperlipidemia, unspecified: Secondary | ICD-10-CM

## 2014-07-09 DIAGNOSIS — R079 Chest pain, unspecified: Secondary | ICD-10-CM

## 2014-07-09 DIAGNOSIS — Z0389 Encounter for observation for other suspected diseases and conditions ruled out: Secondary | ICD-10-CM

## 2014-07-09 DIAGNOSIS — Z79899 Other long term (current) drug therapy: Secondary | ICD-10-CM

## 2014-07-09 DIAGNOSIS — I10 Essential (primary) hypertension: Secondary | ICD-10-CM

## 2014-07-09 MED ORDER — ATORVASTATIN CALCIUM 80 MG PO TABS: 80.0000 mg | ORAL_TABLET | Freq: Every day | ORAL | Status: DC

## 2014-07-09 NOTE — Patient Instructions (Addendum)
Your physician has requested that you have en exercise stress myoview. For further information please visit [email or web address]. Please follow instruction sheet, as given.   Your physician recommends that you return for lab work in: 3 MONTHS.  Your physician recommends that you schedule a follow-up appointment in: 3-4 MONTHS.  Your physician has recommended you make the following change in your medication: INCREASE THE ATORVASTATIN TO 80 MG DAILY.

## 2014-07-10 ENCOUNTER — Encounter: Payer: Self-pay | Admitting: Cardiovascular Disease

## 2014-07-10 DIAGNOSIS — I10 Essential (primary) hypertension: Secondary | ICD-10-CM | POA: Insufficient documentation

## 2014-07-10 DIAGNOSIS — R079 Chest pain, unspecified: Secondary | ICD-10-CM | POA: Insufficient documentation

## 2014-07-10 NOTE — Progress Notes (Signed)
Patient ID: [name] [name], male   DOB: June 23, 1955, 59 y.o.   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 Remotely he had been followed by [name] [name] and [name] [name].    I first saw [name] [name] on 10/27/2012 when he presented to establish cardiology care with me and expressed his interest in aggressive preventive cardiology assessment. He has a strong family history for heart disease in both parents as well as grandparents. He has a history of hypertension and hyperlipidemia.  Remotely when followed by Dr. Mare Ferrari he had undergone several myocardial perfusion studies.  In 2006 and 2007 he also underwent several [facility] heart lab evaluations. In September 2006 his cholesterol was 188, LDL 90, HDL 40 but his triglycerides were 292. He is LDL III a + b. percent was significantly increased at 50.5. HDL2b  percent was low at 11. He has been on statin plus zetia for lipid lowering therapy. In 2008 he began to be followed by Dr. Lesle Reek. A chest CT revealed a calcium score 194. He did have mild coronary obstructive disease with less than 50% calcific lesion noted in the mid LAD and less than 30% calcific disease in the proximal and mid right coronary artery.  Subsequently, he has undergone an exercise stress echo study in November 2011 which was normal. The patient has remained asymptomatic and most of his studies were screening evaluations for CAD. Since Dr. Lesle Reek has left his practice in [place], [name] [name] has selected me to pursue his aggressive preventive cardiology evaluation.  [name] [name] remains very physically active. He exercises regularly and works out with a trainer at least 2 days per week. This past year he skied both at [place], [place] and [place], [place] without cardiovascular problems. He also does a ladder protocol on the elliptical trainer he has a 7 handicap in golf. He apparently was seen in the  emergency room at [facility] for some symptoms of chest sensation in October 2013.   When I saw him initially, I recommended a complete set of laboratory including an NMR lipoprotein of with lipid panel. In addition I also recommended that he undergo a cardiopulmonary met test to assess for not only macrovascular angina but potential microvascular ischemia or endothelial dysfunction findings. Cardiopulmonary met test was done on 11/21/2012. This revealed a slightly reduced peak maximum oxygen consumption 80% of predicted he did have a low peak stroke volume. His anaerobic threshold was in the normal range at 13.7 mL's per kilogram per minute. Abnormal pulmonary status. Laboratory revealed a fasting glucose of 105. LDL cholesterol is excellent at 61 with an LDL particle number 01/13/1996 however, his triglycerides were still elevated at 325 and increased large VLDL of 9.5 His LDL particles were 631. Had a normal HDL of 46 and normal HDL particle number 39.4. His insulin resistance score was increased at 57.  On 03/28/2013, laboratory revealed a fasting glucose of 102. Bilirubin was 1.3. He had normal SGOT and SGPT. LDL particle number was excellent at 824, LDL 63, triglycerides were markedly improved but still minimally elevated at 169. Large LDL was also mildly elevated at 6.4 as was small LDL particle number at 654. HDL cholesterol was 47. Insulin resistance score was somewhat improved but again still mildly elevated at 52.  Since I last saw him in May 2015, he has continued to be fairly active.  However, recently he admits to  experiencing 4-5 episodes of a chest pressure sensation.  This was not always precipitated by activity.  He had gone skiing and was able to ski but did note some sharp twinges of chest discomfort.  Because of his significant family history for premature coronary artery disease and with significant disease in all 4 grandparents, father and mother, he was concerned about these  episodes of chest pain and presents for evaluation.  Last week he underwent a follow-up NMR lipoprotein 50 which showed a total cholesterol of 187, triglycerides were mildly elevated at 173.  HDL cholesterol is excellent at 59 and LDL cholesterol was 93.  He had 566 small LDL particles in his LDL particle number was 1211.  Insulin resistance score was slightly elevated at 59, which had increased from 46 9 months previously.   Past Medical History  Diagnosis Date  . Coronary artery disease (CAD) excluded     status post cardiac CT scan with only mild plaquing and no obstructive lesions 2006status post stress echo, ejection fraction 55% able to achieved 10  METS  . Hypertriglyceridemia   . Dyslipidemia   . History of MRSA infection   . Back problem     Past Surgical History  Procedure Laterality Date  . Back surgery      No Known Allergies  Current Outpatient Prescriptions  Medication Sig Dispense Refill  . amLODipine-benazepril (LOTREL) 5-10 MG per capsule TAKE 1 CAPSULE BY MOUTH EVERY DAY 30 capsule 7  . aspirin 81 MG EC tablet Take 81 mg by mouth daily.      Marland Kitchen atenolol (TENORMIN) 25 MG tablet Take 0.5 tablets (12.5 mg total) by mouth daily. 15 tablet 5  . atenolol (TENORMIN) 50 MG tablet Takes 1/4 tablet daily (12.5 mg)    . atorvastatin (LIPITOR) 80 MG tablet Take 1 tablet (80 mg total) by mouth daily. 15 tablet 5  . Dutasteride-Tamsulosin HCl (JALYN) 0.5-0.4 MG CAPS Take 1 capsule by mouth daily.      . Multiple Vitamin (MULTIVITAMIN) capsule Take 1 capsule by mouth daily.      . Omega-3 1400 MG CAPS 2 capsules in the morning and 1capsule in the evening    . ZETIA 10 MG tablet TAKE 1 TABLET BY MOUTH DAILY 90 tablet 2   No current facility-administered medications for this visit.   Socially, he is married for 28 years. He works in Psychologist, forensic. He did his undergraduate degree at [employer]. 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 There is no  tobacco history. He does drink alcohol socially. He remains active with athletics.  Family History  Problem Relation Age of Onset  . Heart disease    . Hypertension    . Stroke    . Heart attack Father   . Heart attack Mother   . Hypertension Mother   . Hypertension Father   . Hyperlipidemia Father   . Hyperlipidemia Mother    ROS General: Negative; No fevers, chills, or night sweats; there is no change in weight HEENT: Negative; No changes in vision or hearing, sinus congestion, difficulty swallowing Pulmonary: He is status post recent allergy and treatment with Z-Pak.; No cough, wheezing, shortness of breath, hemoptysis Cardiovascular: Negative; No chest pain, presyncope, syncope, palpatations GI: Negative; No nausea, vomiting, diarrhea, or abdominal pain GU: Negative; No dysuria, hematuria, or difficulty voiding Musculoskeletal: Negative; no myalgias, joint pain, or weakness Hematologic/Oncology: Negative; no easy bruising, bleeding Endocrine: Negative; no heat/cold intolerance; no diabetes Neuro: Negative;  no changes in balance, headaches Skin: Negative; No rashes or skin lesions Psychiatric: Negative; No behavioral problems, depression Sleep: Negative; No snoring, daytime sleepiness, hypersomnolence, bruxism, restless legs, hypnogognic hallucinations, no cataplexy Other comprehensive 14 point system review is negative.   PE BP 130/64 mmHg  Pulse 72  Ht '5\' 10"'  (1.778 m)  Wt 174 lb 4.8 oz (79.062 kg)  BMI 25.01 kg/m2 General: Alert, oriented, no distress.  HEENT: Normocephalic, atraumatic. Pupils round and reactive; sclera anicteric; Fundi normal Nose without nasal septal hypertrophy Mouth/Parynx benign; Mallinpatti scale 2 Neck: No JVD, no carotid bruits with normal carotid upstroke Lungs: clear to ausculatation and percussion; no wheezing or rales Chest wall: Nontender to palpation Heart: PMI is not displaced RRR, s1 s2 normal no s3. No murmur, gallop, rubs or  heave. Abdomen: soft, nontender; no hepatosplenomehaly, BS+; abdominal aorta nontender and not dilated by palpation. Back: No CVA tenderness Pulses 2+ Extremities: no clubbinbg cyanosis or edema, Homan's sign negative  Neurologic: grossly nonfocal Psychological: Normal affect and mood  ECG (independently read by me): Normal sinus rhythm at 72 bpm.  No significant ST segment changes.  Normal intervals.   May 2015 ECG (independently read by me) sinus bradycardia 57 beats per minute.  No ectopy.  Normal intervals.  ECG: Normal sinus rhythm at 60 with voltage criteria for LVH.  LABS:  BMET    Component Value Date/Time   NA 140 07/05/2014 1044   K 4.2 07/05/2014 1044   CL 106 07/05/2014 1044   CO2 16* 07/05/2014 1044   GLUCOSE 87 07/05/2014 1044   BUN 12 07/05/2014 1044   CREATININE 0.89 07/05/2014 1044   CREATININE 0.92 02/04/2012 1530   CALCIUM 9.0 07/05/2014 1044   GFRNONAA >90 02/04/2012 1530   GFRAA >90 02/04/2012 1530     Hepatic Function Panel     Component Value Date/Time   PROT 6.8 07/05/2014 1044     CBC    Component Value Date/Time   WBC 7.2 07/05/2014 1213   RBC 4.63 07/05/2014 1213   HGB 15.2 07/05/2014 1213   HCT 43.2 07/05/2014 1213   PLT 228 07/05/2014 1213   MCV 93.3 07/05/2014 1213   MCH 32.8 07/05/2014 1213   MCHC 35.2 07/05/2014 1213   RDW 14.5 07/05/2014 1213   LYMPHSABS 1.2 02/04/2012 1530   MONOABS 0.4 02/04/2012 1530   EOSABS 0.0 02/04/2012 1530   BASOSABS 0.0 02/04/2012 1530     BNP No results found for: PROBNP  Lipid Panel  No results found for: CHOL   RADIOLOGY: No results found.   ASSESSMENT AND PLAN:   [name]. Richman has  mild coronary calcification and nonobstructive stenoses demonstrated on a prior chest CT in 2008.  He has had normal myocardial perfusion studies and stress echo studies in the past, with his last nuclear perfusion study being almost 9 years ago.. A cardiopulmonary met test revealed slightly impaired  low-normal peak maximum oxygen consumption.  He continues to be asymptomatic and active, exercising regularly.  His blood pressure today is controlled on Lotrel 5/10.  In addition to atenolol 12.5 mg daily.  He is on aspirin for antiplatelet benefit.  He has a history of significant hyperlipidemia and recently has been on Zetia 10 mg, atorvastatin 40 mg, in addition to alpha 3 omega fatty acids for which he takes a total of 3 capsules daily.  I reviewed his NMR profile.  He still has slight increased LDL particle number.  I have suggested he further titrate his atorvastatin  to 80 mg for more aggressive treatment.  He is concerned about his 4-5 episodes of mild chest pressure.  Remotely, a chest CT revealed a calcium score of 194 and he had mild coronary obstructive disease.  With his recent chest pain development I have recommended an exercise Myoview study for further evaluation.  I will repeat a lipid panel and chemistry profile in 3 months.  I will see him in follow-up evaluation.    Troy Sine, MD, [facility] 07/10/2014 4:19 PM

## 2014-07-12 ENCOUNTER — Telehealth: Payer: Self-pay | Admitting: Emergency Medicine

## 2014-07-12 NOTE — Telephone Encounter (Signed)
lmsg for patient to call.  He needs a stress test and f/up appt in 3-4 months with Dr. Claiborne Billings.

## 2014-07-19 ENCOUNTER — Telehealth (HOSPITAL_COMMUNITY): Payer: Self-pay

## 2014-07-19 NOTE — Telephone Encounter (Signed)
Encounter complete. 

## 2014-07-20 ENCOUNTER — Telehealth (HOSPITAL_COMMUNITY): Payer: Self-pay

## 2014-07-20 NOTE — Telephone Encounter (Signed)
Encounter complete. 

## 2014-07-24 ENCOUNTER — Ambulatory Visit (HOSPITAL_COMMUNITY)
Admission: RE | Admit: 2014-07-24 | Discharge: 2014-07-24 | Disposition: A | Discharge status: Home / Self Care | Payer: BLUE CROSS/BLUE SHIELD | Source: Ambulatory Visit | Attending: Internal Medicine | Admitting: Internal Medicine

## 2014-07-24 DIAGNOSIS — E785 Hyperlipidemia, unspecified: Secondary | ICD-10-CM | POA: Diagnosis not present

## 2014-07-24 DIAGNOSIS — R079 Chest pain, unspecified: Secondary | ICD-10-CM | POA: Insufficient documentation

## 2014-07-24 DIAGNOSIS — Z8249 Family history of ischemic heart disease and other diseases of the circulatory system: Secondary | ICD-10-CM | POA: Diagnosis not present

## 2014-07-24 DIAGNOSIS — Z0389 Encounter for observation for other suspected diseases and conditions ruled out: Secondary | ICD-10-CM

## 2014-07-24 DIAGNOSIS — I10 Essential (primary) hypertension: Secondary | ICD-10-CM | POA: Insufficient documentation

## 2014-07-24 MED ORDER — TECHNETIUM TC 99M SESTAMIBI GENERIC - CARDIOLITE
10.5000 | Freq: Once | INTRAVENOUS | Status: AC | PRN
  Administered 2014-07-24: 11 via INTRAVENOUS

## 2014-07-24 MED ORDER — TECHNETIUM TC 99M SESTAMIBI GENERIC - CARDIOLITE
30.1000 | Freq: Once | INTRAVENOUS | Status: AC | PRN
  Administered 2014-07-24: 30.1 via INTRAVENOUS

## 2014-07-24 NOTE — Procedures (Addendum)
[facility] [address] [place] [address] [phone]  Cardiology Nuclear Med Study  [name] [name] is a 59 y.o. male     MRN : [id number]     DOB: 29-Oct-1955  Procedure Date: 07/24/2014  Nuclear Med Background Indication for Stress Test:  Evaluation for Ischemia History:  Mild coronary calcification and non-obstructive stenosis per CT-2008;Last NUC MPI on 02/26/2006-normal;EF=64% Cardiac Risk Factors: Family History - CAD, Hypertension and Lipids  Symptoms:  Chest Pain   Nuclear Pre-Procedure Caffeine/Decaff Intake:  7:00pm NPO After: 5:00am   IV Site: R Forearm  IV 0.9% NS with Angio Cath:  22g  Chest Size (in):  44" IV Started by: Rolene Course, RN  Height: 5\' 10"  (1.778 m)  Cup Size: n/a  BMI:  Body mass index is 24.97 kg/(m^2). Weight:  174 lb (78.926 kg)   Tech Comments:  n/a    Nuclear Med Study 1 or 2 day study: 1 day  Stress Test Type:  Stress  Order Authorizing Provider:  Shelva Majestic, MD   Resting Radionuclide: Technetium 27m Sestamibi  Resting Radionuclide Dose: 10.5 mCi   Stress Radionuclide:  Technetium 11m Sestamibi  Stress Radionuclide Dose: 30.1 mCi           Stress Protocol Rest HR: 83 Stress HR: 164  Rest BP: 156/85 Stress BP: 188/77  Exercise Time (min): 12 METS: 13.7   Predicted Max HR: 162 bpm % Max HR: 101.23 bpm Rate Pressure Product: 21308  Dose of Adenosine (mg):  n/a Dose of Lexiscan: n/a mg  Dose of Atropine (mg): n/a Dose of Dobutamine: n/a mcg/kg/min (at max HR)  Stress Test Technologist: Leane Para, CCT Nuclear Technologist: Imagene Riches, CNMT   Rest Procedure:  Myocardial perfusion imaging was performed at rest 45 minutes following the intravenous administration of Technetium 74m Sestamibi. Stress Procedure:  The patient performed treadmill exercise using a Bruce  Protocol for 12 minutes. The patient stopped due to SOB and THR achieved. and denied any chest pain.   There were no significant ST-T wave changes.  Technetium 18m Sestamibi was injected IV at peak exercise and myocardial perfusion imaging was performed after a brief delay.  Transient Ischemic Dilatation (Normal <1.22):  0.88  QGS EDV:  107 ml QGS ESV:  38 ml LV Ejection Fraction: 64%       Rest ECG: NSR - Normal EKG  Stress ECG: No significant change from baseline ECG  QPS Raw Data Images:  Normal; no motion artifact; normal heart/lung ratio. Stress Images:  Normal homogeneous uptake in all areas of the myocardium. Rest Images:  Normal homogeneous uptake in all areas of the myocardium. Subtraction (SDS):  No evidence of ischemia.  Impression Exercise Capacity:  Excellent exercise capacity. BP Response:  Normal blood pressure response. Clinical Symptoms:  No significant symptoms noted. ECG Impression:  No significant ST segment change suggestive of ischemia. Comparison with Prior Nuclear Study: No significant change from previous study  Overall Impression:  Normal stress nuclear study.  LV Wall Motion:  NL LV Function; NL Wall Motion   Lorretta Harp, MD  07/24/2014 12:36 PM

## 2014-07-30 ENCOUNTER — Telehealth: Payer: Self-pay | Admitting: Cardiovascular Disease

## 2014-07-30 NOTE — Telephone Encounter (Signed)
Patient notified of results. He voiced understanding.

## 2014-07-30 NOTE — Telephone Encounter (Signed)
Pt called in stating that he had a Myocardial Perfusion on 2/16 and would like to know what his results were from the test. Please f/u with pt  Thanks

## 2014-07-31 ENCOUNTER — Telehealth: Payer: Self-pay | Admitting: Cardiovascular Disease

## 2014-07-31 NOTE — Telephone Encounter (Signed)
Pt wants to know if the written results will be put in my chart.If not would you please send him a written report.He received oral results yesterday.

## 2014-07-31 NOTE — Telephone Encounter (Signed)
Spoke with pt, nuclear results placed in the mail to the patients confirmed mailing address.

## 2014-08-06 ENCOUNTER — Other Ambulatory Visit: Payer: Self-pay | Admitting: Cardiovascular Disease

## 2014-08-06 NOTE — Telephone Encounter (Signed)
Rx has been sent to the pharmacy electronically. ° °

## 2014-10-31 LAB — COMPREHENSIVE METABOLIC PANEL
ALT: 35 U/L (ref 0–53)
AST: 27 U/L (ref 0–37)
Albumin: 4.3 g/dL (ref 3.5–5.2)
Alkaline Phosphatase: 67 U/L (ref 39–117)
BUN: 12 mg/dL (ref 6–23)
CO2: 29 mEq/L (ref 19–32)
Calcium: 9.3 mg/dL (ref 8.4–10.5)
Chloride: 104 mEq/L (ref 96–112)
Creat: 0.94 mg/dL (ref 0.50–1.35)
Glucose, Bld: 99 mg/dL (ref 70–99)
Potassium: 4.1 mEq/L (ref 3.5–5.3)
Sodium: 141 mEq/L (ref 135–145)
Total Bilirubin: 1.4 mg/dL — ABNORMAL HIGH (ref 0.2–1.2)
Total Protein: 7.2 g/dL (ref 6.0–8.3)

## 2014-10-31 LAB — LIPID PANEL
Cholesterol: 151 mg/dL (ref 0–200)
HDL: 51 mg/dL (ref >=40)
LDL Cholesterol: 70 mg/dL (ref 0–99)
Total CHOL/HDL Ratio: 3 Ratio
Triglycerides: 152 mg/dL — ABNORMAL HIGH (ref <150)
VLDL: 30 mg/dL (ref 0–40)

## 2014-11-06 ENCOUNTER — Ambulatory Visit (INDEPENDENT_AMBULATORY_CARE_PROVIDER_SITE_OTHER): Payer: BLUE CROSS/BLUE SHIELD | Admitting: Cardiovascular Disease

## 2014-11-06 ENCOUNTER — Encounter: Payer: Self-pay | Admitting: Cardiovascular Disease

## 2014-11-06 VITALS — BP 140/80 | HR 61 | Ht 70.0 in | Wt 189.8 lb

## 2014-11-06 DIAGNOSIS — I2510 Atherosclerotic heart disease of native coronary artery without angina pectoris: Secondary | ICD-10-CM

## 2014-11-06 DIAGNOSIS — E785 Hyperlipidemia, unspecified: Secondary | ICD-10-CM | POA: Diagnosis not present

## 2014-11-06 DIAGNOSIS — I10 Essential (primary) hypertension: Secondary | ICD-10-CM

## 2014-11-06 NOTE — Progress Notes (Signed)
Patient ID: [name] [name], male   DOB: 1956-04-16, 59 y.o.   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 Remotely he had been followed by Drs. Brackbill and deGent.    I first saw [name] [name] on 10/27/2012 when he presented to establish cardiology care with me and expressed his interest in aggressive preventive cardiology assessment. He has a strong family history for heart disease in both parents as well as grandparents. He has a history of hypertension and hyperlipidemia.  Remotely when followed by Dr. Mare Ferrari he had undergone several myocardial perfusion studies.  In 2006 and 2007 he also underwent several [facility] heart lab evaluations. In September 2006 his cholesterol was 188, LDL 90, HDL 40 but his triglycerides were 292. He is LDL III a + b. percent was significantly increased at 50.5. HDL2b  percent was low at 11. He has been on statin plus zetia for lipid lowering therapy. In 2008 he began to be followed by Dr. Lesle Reek. A chest CT revealed a calcium score 194. He did have mild coronary obstructive disease with less than 50% calcific lesion noted in the mid LAD and less than 30% calcific disease in the proximal and mid RCA.  Subsequently, he has undergone an exercise stress echo study in November 2011 which was normal. The patient has remained asymptomatic and most of his studies were screening evaluations for CAD. Since Dr. Lesle Reek has left his practice in [place], [name] [name] has selected me to pursue his aggressive preventive cardiology evaluation.  [name] [name] remains very physically active. He exercises regularly and works out with a trainer at least 2 days per week. This past year he skied both at [place], [place] and [place], [place] without cardiovascular problems. He also does a ladder protocol on the elliptical trainer he has a 7 handicap in golf. He apparently was seen in the emergency room at  [facility] for some symptoms of chest sensation in October 2013.   When I saw him initially, I recommended a complete set of laboratory including an NMR lipoprotein of with lipid panel. In addition I also recommended that he undergo a cardiopulmonary met test to assess for not only macrovascular angina but potential microvascular ischemia or endothelial dysfunction findings. Cardiopulmonary met test was done on 11/21/2012. This revealed a slightly reduced peak maximum oxygen consumption 80% of predicted he did have a low peak stroke volume. His anaerobic threshold was in the normal range at 13.7 mL's per kilogram per minute. Abnormal pulmonary status. Laboratory revealed a fasting glucose of 105. LDL cholesterol is excellent at 61 with an LDL particle number 01/13/1996 however, his triglycerides were still elevated at 325 and increased large VLDL of 9.5 His LDL particles were 631. Had a normal HDL of 46 and normal HDL particle number 39.4. His insulin resistance score was increased at 57.  On 03/28/2013, laboratory revealed a fasting glucose of 102. Bilirubin was 1.3. He had normal SGOT and SGPT. LDL particle number was excellent at 824, LDL 63, triglycerides were markedly improved but still minimally elevated at 169. Large LDL was also mildly elevated at 6.4 as was small LDL particle number at 654. HDL cholesterol was 47. Insulin resistance score was somewhat improved but again still mildly elevated at 52.  Since I last saw him in May 2015, he has continued to be fairly active.  However, recently he admits to experiencing 4-5  episodes of a chest pressure sensation.  This was not always precipitated by activity.  He had gone skiing and was able to ski but did note some sharp twinges of chest discomfort.  Because of his significant family history for premature coronary artery disease and with significant disease in all 4 grandparents, father and mother, he was concerned about these episodes of chest  pain and presents for evaluation.  A follow-up NMR lipoprotein on 07/05/2014 showed a total cholesterol of 187, triglycerides were mildly elevated at 173.  HDL cholesterol is excellent at 59 and LDL cholesterol was 93.  He had 566 small LDL particles in his LDL particle number was 1211.  Insulin resistance score was slightly elevated at 59, which had increased from 46 9 months previously.  I last saw him, he had complained of some very mild episodes of vague chest sensation.  Remotely, a chest CT revealed a calcium score 194 and he had mild coronary obstructive disease.  With his recent chest pain development.  I recommended that he undergo an exercise Myoview study.  This was done on 07/24/2014 and revealed normal perfusion without scar or ischemia.  Post stress ejection fraction was 64%.  He uunderwent follow-up lipid panel on 10/30/2014.  This was improved with a total cholesterol 151, triglycerides 152, HDL 51, LDL 70.   Past Medical History  Diagnosis Date  . Coronary artery disease (CAD) excluded     status post cardiac CT scan with only mild plaquing and no obstructive lesions 2006status post stress echo, ejection fraction 55% able to achieved 10  METS  . Hypertriglyceridemia   . Dyslipidemia   . History of MRSA infection   . Back problem     Past Surgical History  Procedure Laterality Date  . Back surgery      No Known Allergies  Current Outpatient Prescriptions  Medication Sig Dispense Refill  . amLODipine-benazepril (LOTREL) 5-10 MG per capsule TAKE 1 CAPSULE BY MOUTH EVERY DAY 30 capsule 7  . aspirin 81 MG EC tablet Take 81 mg by mouth daily.      Marland Kitchen atenolol (TENORMIN) 25 MG tablet TAKE 1/2 TABLET BY MOUTH DAILY 15 tablet 5  . atorvastatin (LIPITOR) 80 MG tablet Take 1 tablet (80 mg total) by mouth daily. 30 tablet 9  . Dutasteride-Tamsulosin HCl (JALYN) 0.5-0.4 MG CAPS Take 1 capsule by mouth daily.      . Multiple Vitamin (MULTIVITAMIN) capsule Take 1 capsule by mouth  daily.      . Omega-3 1400 MG CAPS 2 capsules in the morning and 1capsule in the evening    . ZETIA 10 MG tablet TAKE 1 TABLET BY MOUTH DAILY 90 tablet 2   No current facility-administered medications for this visit.   Socially, he is married for 28 years. He works in Psychologist, forensic. He did his undergraduate degree at [employer]. 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 There is no tobacco history. He does drink alcohol socially. He remains active with athletics.  Family History  Problem Relation Age of Onset  . Heart disease    . Hypertension    . Stroke    . Heart attack Father   . Heart attack Mother   . Hypertension Mother   . Hypertension Father   . Hyperlipidemia Father   . Hyperlipidemia Mother    ROS General: Negative; No fevers, chills, or night sweats; there is no change in weight HEENT: Negative; No changes in vision or  hearing, sinus congestion, difficulty swallowing Pulmonary: He is status post recent allergy and treatment with Z-Pak.; No cough, wheezing, shortness of breath, hemoptysis Cardiovascular: Negative; No chest pain, presyncope, syncope, palpatations GI: Negative; No nausea, vomiting, diarrhea, or abdominal pain GU: Negative; No dysuria, hematuria, or difficulty voiding Musculoskeletal: Negative; no myalgias, joint pain, or weakness Hematologic/Oncology: Negative; no easy bruising, bleeding Endocrine: Negative; no heat/cold intolerance; no diabetes Neuro: Negative; no changes in balance, headaches Skin: Negative; No rashes or skin lesions Psychiatric: Negative; No behavioral problems, depression Sleep: Negative; No snoring, daytime sleepiness, hypersomnolence, bruxism, restless legs, hypnogognic hallucinations, no cataplexy Other comprehensive 14 point system review is negative.   PE BP 140/80 mmHg  Pulse 61  Ht '5\' 10"'  (1.778 m)  Wt 189 lb 12.8 oz (86.093 kg)  BMI 27.23 kg/m2  Wt Readings from Last 3 Encounters:    11/06/14 189 lb 12.8 oz (86.093 kg)  07/24/14 174 lb (78.926 kg)  07/09/14 174 lb 4.8 oz (79.062 kg)   General: Alert, oriented, no distress.  HEENT: Normocephalic, atraumatic. Pupils round and reactive; sclera anicteric; Fundi normal Nose without nasal septal hypertrophy Mouth/Parynx benign; Mallinpatti scale 2 Neck: No JVD, no carotid bruits with normal carotid upstroke Lungs: clear to ausculatation and percussion; no wheezing or rales Chest wall: Nontender to palpation Heart: PMI is not displaced RRR, s1 s2 normal no s3. No murmur, gallop, rubs or heave. Abdomen: soft, nontender; no hepatosplenomehaly, BS+; abdominal aorta nontender and not dilated by palpation. Back: No CVA tenderness Pulses 2+ Extremities: no clubbinbg cyanosis or edema, Homan's sign negative  Neurologic: grossly nonfocal Psychological: Normal affect and mood  ECG (independently read by me): normal sinus rhythm at 61 bpm.  Normal intervals.  No ST segment changes.  February 2016ECG (independently read by me): Normal sinus rhythm at 72 bpm.  No significant ST segment changes.  Normal intervals.  May 2015 ECG (independently read by me) sinus bradycardia 57 beats per minute.  No ectopy.  Normal intervals.  ECG: Normal sinus rhythm at 60 with voltage criteria for LVH.  LABS:\ BMP Latest Ref Rng 10/30/2014 07/05/2014 10/05/2013  Glucose 70 - 99 mg/dL 99 87 98  BUN 6 - 23 mg/dL '12 12 14  ' Creatinine 0.50 - 1.35 mg/dL 0.94 0.89 0.90  Sodium 135 - 145 mEq/L 141 140 142  Potassium 3.5 - 5.3 mEq/L 4.1 4.2 4.3  Chloride 96 - 112 mEq/L 104 106 105  CO2 19 - 32 mEq/L 29 16(L) 28  Calcium 8.4 - 10.5 mg/dL 9.3 9.0 9.9   Hepatic Function Latest Ref Rng 10/30/2014 07/05/2014 10/05/2013  Total Protein 6.0 - 8.3 g/dL 7.2 6.8 7.1  Albumin 3.5 - 5.2 g/dL 4.3 4.4 4.5  AST 0 - 37 U/L '27 23 23  ' ALT 0 - 53 U/L 35 25 33  Alk Phosphatase 39 - 117 U/L 67 68 64  Total Bilirubin 0.2 - 1.2 mg/dL 1.4(H) 1.1 1.3(H)   CBC Latest Ref Rng  07/05/2014 03/28/2013 11/21/2012  WBC 4.0 - 10.5 K/uL 7.2 6.2 6.8  Hemoglobin 13.0 - 17.0 g/dL 15.2 15.2 16.1  Hematocrit 39.0 - 52.0 % 43.2 42.8 45.7  Platelets 150 - 400 K/uL 228 209 206   Lab Results  Component Value Date   MCV 93.3 07/05/2014   MCV 91.1 03/28/2013   MCV 90.0 11/21/2012   Lab Results  Component Value Date   TSH 1.112 10/05/2013    No results found for: HGBA1C   Lipid Panel     Component Value  Date/Time   CHOL 151 10/30/2014 0836   CHOL 187 07/05/2014 1218   TRIG 152* 10/30/2014 0836   TRIG 173* 07/05/2014 1218   HDL 51 10/30/2014 0836   HDL 59 07/05/2014 1218   CHOLHDL 3.0 10/30/2014 0836   VLDL 30 10/30/2014 0836   LDLCALC 70 10/30/2014 0836   LDLCALC 93 07/05/2014 1218      ASSESSMENT AND PLAN:   [name]. Grewell has is a 59 year old active gentleman who has mild coronary calcification and nonobstructive stenoses demonstrated on a prior chest CT in 2008.  He has had normal myocardial perfusion studies and stress echo studies in the past, with his last nuclear perfusion study being almost 9 years ago. His most recent nuclear study done this year.  Continues to be unchanged and shows normal perfusion without scar or ischemia.  His blood pressure is well controlled today on Lotrel 5/10, atenolol 12.5 mg daily.  His lipids have improved on atorvastatin 80 mg, Zetia 10 mg, and omega-3 fatty acids.  He is to exercise regularly.  He denies change in symptomatology.  He denies further episodes of chest pain.  He will continue current therapy as prescribed.  I will see him in one year for reevaluation or sooner if problems arise.   Troy Sine, MD, [facility] 11/06/2014 9:13 PM

## 2014-11-06 NOTE — Patient Instructions (Signed)
Your physician wants you to follow-up in: 1 year or sooner if needed. You will receive a reminder letter in the mail two months in advance. If you don't receive a letter, please call our office to schedule the follow-up appointment.  

## 2014-12-03 ENCOUNTER — Other Ambulatory Visit: Payer: Self-pay | Admitting: Cardiovascular Disease

## 2015-02-01 ENCOUNTER — Ambulatory Visit (INDEPENDENT_AMBULATORY_CARE_PROVIDER_SITE_OTHER): Payer: BLUE CROSS/BLUE SHIELD | Admitting: Urology

## 2015-02-01 DIAGNOSIS — R300 Dysuria: Secondary | ICD-10-CM

## 2015-02-01 DIAGNOSIS — N410 Acute prostatitis: Secondary | ICD-10-CM

## 2015-02-01 DIAGNOSIS — N401 Enlarged prostate with lower urinary tract symptoms: Secondary | ICD-10-CM | POA: Diagnosis not present

## 2015-02-02 ENCOUNTER — Other Ambulatory Visit: Payer: Self-pay | Admitting: Cardiovascular Disease

## 2015-02-04 NOTE — Telephone Encounter (Signed)
REFILL 

## 2015-03-03 ENCOUNTER — Other Ambulatory Visit: Payer: Self-pay | Admitting: Cardiovascular Disease

## 2015-03-04 NOTE — Telephone Encounter (Signed)
Rx request sent to pharmacy.  

## 2015-07-31 ENCOUNTER — Telehealth: Payer: Self-pay | Admitting: Cardiovascular Disease

## 2015-07-31 DIAGNOSIS — Z79899 Other long term (current) drug therapy: Secondary | ICD-10-CM

## 2015-07-31 DIAGNOSIS — E785 Hyperlipidemia, unspecified: Secondary | ICD-10-CM

## 2015-07-31 DIAGNOSIS — N419 Inflammatory disease of prostate, unspecified: Secondary | ICD-10-CM

## 2015-07-31 NOTE — Telephone Encounter (Signed)
[facility] - notified that labwork including PSA ordered.

## 2015-07-31 NOTE — Telephone Encounter (Signed)
Is asking for a lab order to sent out to him , as well as could Dr. Claiborne Billings order a PSA for him along with the other Labs .Marland Kitchen  Thanks

## 2015-09-02 ENCOUNTER — Other Ambulatory Visit: Payer: Self-pay | Admitting: Cardiovascular Disease

## 2015-09-03 NOTE — Telephone Encounter (Signed)
REFILL 

## 2015-10-03 ENCOUNTER — Other Ambulatory Visit: Payer: Self-pay | Admitting: Cardiovascular Disease

## 2015-10-04 NOTE — Telephone Encounter (Signed)
Rx(s) sent to pharmacy electronically.  

## 2015-10-24 ENCOUNTER — Ambulatory Visit: Payer: BLUE CROSS/BLUE SHIELD | Admitting: Cardiovascular Disease

## 2015-11-03 ENCOUNTER — Other Ambulatory Visit: Payer: Self-pay | Admitting: Cardiovascular Disease

## 2015-11-30 ENCOUNTER — Other Ambulatory Visit: Payer: Self-pay | Admitting: Cardiovascular Disease

## 2015-12-02 NOTE — Telephone Encounter (Signed)
Rx request sent to pharmacy.  

## 2016-02-03 ENCOUNTER — Encounter: Payer: Self-pay | Admitting: Cardiovascular Disease

## 2016-02-03 ENCOUNTER — Other Ambulatory Visit: Payer: Self-pay | Admitting: *Deleted

## 2016-02-03 ENCOUNTER — Telehealth: Payer: Self-pay | Admitting: Cardiovascular Disease

## 2016-02-03 DIAGNOSIS — E785 Hyperlipidemia, unspecified: Secondary | ICD-10-CM

## 2016-02-03 DIAGNOSIS — Z79899 Other long term (current) drug therapy: Secondary | ICD-10-CM

## 2016-02-03 NOTE — Telephone Encounter (Signed)
New message      Pt calling about wanting to schedule a lab. Please call.

## 2016-02-03 NOTE — Telephone Encounter (Signed)
Returned patient call-requesting lab work be ordered prior to appt on 9/6 with MD [employer].  States MD Claiborne Billings usually orders lab work prior to appt.    Lab orders active, pt made aware.

## 2016-02-07 ENCOUNTER — Other Ambulatory Visit: Payer: Self-pay | Admitting: *Deleted

## 2016-02-07 ENCOUNTER — Telehealth: Payer: Self-pay | Admitting: *Deleted

## 2016-02-07 DIAGNOSIS — N419 Inflammatory disease of prostate, unspecified: Secondary | ICD-10-CM

## 2016-02-07 DIAGNOSIS — Z79899 Other long term (current) drug therapy: Secondary | ICD-10-CM

## 2016-02-07 DIAGNOSIS — E785 Hyperlipidemia, unspecified: Secondary | ICD-10-CM

## 2016-02-07 DIAGNOSIS — E781 Pure hyperglyceridemia: Secondary | ICD-10-CM

## 2016-02-07 LAB — CBC
HCT: 44.1 % (ref 38.5–50.0)
Hemoglobin: 15.2 g/dL (ref 13.2–17.1)
MCH: 32.1 pg (ref 27.0–33.0)
MCHC: 34.5 g/dL (ref 32.0–36.0)
MCV: 93 fL (ref 80.0–100.0)
MPV: 9.6 fL (ref 7.5–12.5)
Platelets: 203 10*3/uL (ref 140–400)
RBC: 4.74 MIL/uL (ref 4.20–5.80)
RDW: 13.5 % (ref 11.0–15.0)
WBC: 6.4 10*3/uL (ref 3.8–10.8)

## 2016-02-07 LAB — LIPID PANEL
Cholesterol: 151 mg/dL (ref 125–200)
HDL: 55 mg/dL (ref >=40)
LDL Cholesterol: 60 mg/dL (ref <130)
Total CHOL/HDL Ratio: 2.7 Ratio (ref <=5.0)
Triglycerides: 178 mg/dL — ABNORMAL HIGH (ref <150)
VLDL: 36 mg/dL — ABNORMAL HIGH (ref <30)

## 2016-02-07 NOTE — Telephone Encounter (Signed)
Received call from [facility]-pt there to have lab work drawn and only order showing up is CBC.  Lab orders >43months old (placed in 07/2015), will reorder.    Lab orders placed.

## 2016-02-08 LAB — COMPREHENSIVE METABOLIC PANEL
ALT: 35 U/L (ref 9–46)
AST: 26 U/L (ref 10–35)
Albumin: 4.6 g/dL (ref 3.6–5.1)
Alkaline Phosphatase: 62 U/L (ref 40–115)
BUN: 11 mg/dL (ref 7–25)
CO2: 28 mmol/L (ref 20–31)
Calcium: 9.5 mg/dL (ref 8.6–10.3)
Chloride: 102 mmol/L (ref 98–110)
Creat: 0.95 mg/dL (ref 0.70–1.33)
Glucose, Bld: 95 mg/dL (ref 65–99)
Potassium: 4.2 mmol/L (ref 3.5–5.3)
Sodium: 138 mmol/L (ref 135–146)
Total Bilirubin: 1.5 mg/dL — ABNORMAL HIGH (ref 0.2–1.2)
Total Protein: 7.1 g/dL (ref 6.1–8.1)

## 2016-02-08 LAB — TSH: TSH: 1.35 mIU/L (ref 0.40–4.50)

## 2016-02-08 LAB — PSA: PSA: 0.6 ng/mL (ref <=4.0)

## 2016-02-10 NOTE — Progress Notes (Signed)
Patient ID: [name] [name], male   DOB: Sep 03, 1955, 60 y.o.   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 Remotely he had been followed by Drs. Brackbill and deGent.    I first saw Mr [name] on 10/27/2012 when he presented to establish cardiology care with me and expressed his interest in aggressive preventive cardiology assessment. He has a strong family history for heart disease in both parents as well as grandparents. He has a history of hypertension and hyperlipidemia.  Remotely when followed by Dr. Mare Ferrari he had undergone several myocardial perfusion studies.  In 2006 and 2007 he also underwent several [facility] heart lab evaluations. In September 2006 his cholesterol was 188, LDL 90, HDL 40 but his triglycerides were 292. He is LDL III a + b. percent was significantly increased at 50.5. HDL2b  percent was low at 11. He has been on statin plus zetia for lipid lowering therapy. In 2008 he began to be followed by Dr. Lesle Reek. A chest CT revealed a calcium score 194. He did have mild coronary obstructive disease with less than 50% calcific lesion noted in the mid LAD and less than 30% calcific disease in the proximal and mid RCA.  Subsequently, he has undergone an exercise stress echo study in November 2011 which was normal. The patient has remained asymptomatic and most of his studies were screening evaluations for CAD. Since Dr. Lesle Reek has left his practice in [place], [name] [name] has selected me to pursue his aggressive preventive cardiology evaluation.  [name] [name] remains very physically active. He exercises regularly and works out with a trainer at least 2 days per week. This past year he skied both at [place], [place] and [place], [place] without cardiovascular problems. He also does a ladder protocol on the elliptical trainer he has a 7 handicap in golf. He was seen in the emergency room at [facility] for some symptoms of chest sensation in October 2013.   When I saw him initially, I recommended a complete set of laboratory including an NMR lipoprotein of with lipid panel. In addition I also recommended that he undergo a cardiopulmonary met test to assess for not only macrovascular angina but potential microvascular ischemia or endothelial dysfunction findings. Cardiopulmonary met test was done on 11/21/2012. This revealed a slightly reduced peak maximum oxygen consumption 80% of predicted he did have a low peak stroke volume. His anaerobic threshold was in the normal range at 13.7 mL's per kilogram per minute. Abnormal pulmonary status. Laboratory revealed a fasting glucose of 105. LDL cholesterol is excellent at 61 with an LDL particle number 01/13/1996 however, his triglycerides were still elevated at 325 and increased large VLDL of 9.5 His LDL particles were 631. Had a normal HDL of 46 and normal HDL particle number 39.4. His insulin resistance score was increased at 57.  On 03/28/2013, laboratory revealed a fasting glucose of 102. Bilirubin was 1.3. He had normal SGOT and SGPT. LDL particle number was excellent at 824, LDL 63, triglycerides were markedly improved but still minimally elevated at 169. Large LDL was also mildly elevated at 6.4 as was small LDL particle number at 654. HDL cholesterol was 47. Insulin resistance score was somewhat improved but again still mildly elevated at 52.  Since I last saw him in May 2015, he has continued to be fairly active.  However, recently he admits to experiencing 4-5 episodes  of a chest pressure sensation.  This was not always precipitated by activity.  He had gone skiing and was able to ski but did note some sharp twinges of chest discomfort.  Because of his significant family history for premature coronary artery disease and with significant disease in all 4 grandparents, father and mother, he was concerned about these episodes of chest pain and  presents for evaluation.  A follow-up NMR lipoprotein on 07/05/2014 showed a total cholesterol of 187, triglycerides were mildly elevated at 173.  HDL cholesterol is excellent at 59 and LDL cholesterol was 93.  He had 566 small LDL particles in his LDL particle number was 1211.  Insulin resistance score was slightly elevated at 59, which had increased from 46 9 months previously.  In early 2016 he had complained of some very mild episodes of vague chest sensation.  Remotely, a chest CT revealed a calcium score 194 and he had mild coronary obstructive disease.  With his  chest pain development I recommended that he undergo an exercise Myoview study.  This was done on 07/24/2014 and revealed normal perfusion without scar or ischemia.  Post stress ejection fraction was 64%.  He uunderwent follow-up lipid panel on 10/30/2014.  This was improved with a total cholesterol 151, triglycerides 152, HDL 51, LDL 70.  As I last saw him one year ago, he has continued to be active.  He exercises anywhere from 5-7 days per week , typically does cardio alternating with weights.  He has markedly reduced his intake.  At least one meal per day as a salad with protein.  He denies recurrent episodes of chest pain.  He denies palpitations.  His blood pressure when taken at home typically runs in the 1:79 systolic range and 15-05 diastolic range.  He presents for one-year evaluation.     Past Medical History:  Diagnosis Date  . Back problem   . Coronary artery disease (CAD) excluded    status post cardiac CT scan with only mild plaquing and no obstructive lesions 2006status post stress echo, ejection fraction 55% able to achieved 10  METS  . Dyslipidemia   . History of MRSA infection   . Hypertriglyceridemia     Past Surgical History:  Procedure Laterality Date  . BACK SURGERY      No Known Allergies  Current Outpatient Prescriptions  Medication Sig Dispense Refill  . amLODipine-benazepril (LOTREL) 5-10 MG  capsule TAKE 1 CAPSULE BY MOUTH EVERY DAY 30 capsule 3  . aspirin 81 MG EC tablet Take 81 mg by mouth daily.      Marland Kitchen atenolol (TENORMIN) 25 MG tablet TAKE 1/2 TABLET (12.5MG) BY MOUTH EVERY DAY 15 tablet 3  . atorvastatin (LIPITOR) 80 MG tablet Take 80 mg by mouth daily. Pt takes 40 mg daily    . Dutasteride-Tamsulosin HCl (JALYN) 0.5-0.4 MG CAPS Take 1 capsule by mouth daily.      Marland Kitchen ezetimibe (ZETIA) 10 MG tablet Take 10 mg by mouth daily. Pt takes 5 mg daily    . Multiple Vitamin (MULTIVITAMIN) capsule Take 1 capsule by mouth daily.      . Omega-3 1400 MG CAPS Take 3 capsules by mouth daily. 2 capsules in the morning and 1capsule in the evening     No current facility-administered medications for this visit.    Socially, he is married for 28 years. He works in Psychologist, forensic. He did his undergraduate degree at [employer]. graduate work at [employer] and achieved a  Restaurant manager, fast food in finance. There is no tobacco history. He does drink alcohol socially. He remains active with athletics.  Family History  Problem Relation Age of Onset  . Heart disease    . Hypertension    . Stroke    . Heart attack Father   . Hypertension Father   . Hyperlipidemia Father   . Heart attack Mother   . Hypertension Mother   . Hyperlipidemia Mother    ROS General: Negative; No fevers, chills, or night sweats; there is no change in weight HEENT: Negative; No changes in vision or hearing, sinus congestion, difficulty swallowing Pulmonary: He is status post recent allergy and treatment with Z-Pak.; No cough, wheezing, shortness of breath, hemoptysis Cardiovascular: Negative; No chest pain, presyncope, syncope, palpatations GI: Negative; No nausea, vomiting, diarrhea, or abdominal pain GU: Negative; No dysuria, hematuria, or difficulty voiding Musculoskeletal: Negative; no myalgias, joint pain, or weakness Hematologic/Oncology: Negative; no easy bruising, bleeding Endocrine: Negative; no heat/cold intolerance;  no diabetes Neuro: Negative; no changes in balance, headaches Skin: Negative; No rashes or skin lesions Psychiatric: Negative; No behavioral problems, depression Sleep: Negative; No snoring, daytime sleepiness, hypersomnolence, bruxism, restless legs, hypnogognic hallucinations, no cataplexy Other comprehensive 14 point system review is negative.   PE BP (!) 152/82   Pulse 64   Ht '5\' 10"'  (1.778 m)   Wt 179 lb 12.8 oz (81.6 kg)   BMI 25.80 kg/m    Repeat blood pressure when taken by me was 140/84. Wt Readings from Last 3 Encounters:  02/12/16 179 lb 12.8 oz (81.6 kg)  11/06/14 189 lb 12.8 oz (86.1 kg)  07/24/14 174 lb (78.9 kg)   General: Alert, oriented, no distress.  HEENT: Normocephalic, atraumatic. Pupils round and reactive; sclera anicteric; Fundi normal Nose without nasal septal hypertrophy Mouth/Parynx benign; Mallinpatti scale 2 Neck: No JVD, no carotid bruits with normal carotid upstroke Lungs: clear to ausculatation and percussion; no wheezing or rales Chest wall: Nontender to palpation Heart: PMI is not displaced RRR, s1 s2 normal no s3. No murmur, gallop, rubs or heave. Abdomen: soft, nontender; no hepatosplenomehaly, BS+; abdominal aorta nontender and not dilated by palpation. Back: No CVA tenderness Pulses 2+ Extremities: no clubbinbg cyanosis or edema, Homan's sign negative  Neurologic: grossly nonfocal Psychological: Normal affect and mood  ECG (independently read by me): Normal sinus rhythm at 64 bpm.  Borderline voltage criteria for LVH.  No significant ST changes.  Normal intervals.  September 2016 ECG (independently read by me): normal sinus rhythm at 61 bpm.  Normal intervals.  No ST segment changes.  February 2016 ECG (independently read by me): Normal sinus rhythm at 72 bpm.  No significant ST segment changes.  Normal intervals.  May 2015 ECG (independently read by me) sinus bradycardia 57 beats per minute.  No ectopy.  Normal intervals.  ECG: Normal  sinus rhythm at 60 with voltage criteria for LVH.  LABS: BMP Latest Ref Rng & Units 02/07/2016 10/30/2014 07/05/2014  Glucose 65 - 99 mg/dL 95 99 87  BUN 7 - 25 mg/dL '11 12 12  ' Creatinine 0.70 - 1.33 mg/dL 0.95 0.94 0.89  Sodium 135 - 146 mmol/L 138 141 140  Potassium 3.5 - 5.3 mmol/L 4.2 4.1 4.2  Chloride 98 - 110 mmol/L 102 104 106  CO2 20 - 31 mmol/L 28 29 16(L)  Calcium 8.6 - 10.3 mg/dL 9.5 9.3 9.0   Hepatic Function Latest Ref Rng & Units 02/07/2016 10/30/2014 07/05/2014  Total Protein 6.1 - 8.1 g/dL 7.1 7.2 6.8  Albumin 3.6 - 5.1 g/dL 4.6 4.3 4.4  AST 10 - 35 U/L '26 27 23  ' ALT 9 - 46 U/L 35 35 25  Alk Phosphatase 40 - 115 U/L 62 67 68  Total Bilirubin 0.2 - 1.2 mg/dL 1.5(H) 1.4(H) 1.1   CBC Latest Ref Rng & Units 02/07/2016 07/05/2014 03/28/2013  WBC 3.8 - 10.8 K/uL 6.4 7.2 6.2  Hemoglobin 13.2 - 17.1 g/dL 15.2 15.2 15.2  Hematocrit 38.5 - 50.0 % 44.1 43.2 42.8  Platelets 140 - 400 K/uL 203 228 209   Lab Results  Component Value Date   MCV 93.0 02/07/2016   MCV 93.3 07/05/2014   MCV 91.1 03/28/2013   Lab Results  Component Value Date   TSH 1.35 02/07/2016    No results found for: HGBA1C   Lipid Panel     Component Value Date/Time   CHOL 151 02/07/2016 1504   CHOL 187 07/05/2014 1218   TRIG 178 (H) 02/07/2016 1504   TRIG 173 (H) 07/05/2014 1218   HDL 55 02/07/2016 1504   HDL 59 07/05/2014 1218   CHOLHDL 2.7 02/07/2016 1504   VLDL 36 (H) 02/07/2016 1504   LDLCALC 60 02/07/2016 1504   [place] 93 07/05/2014 1218      ASSESSMENT AND PLAN:  Mr. Primeau has is a 60 year old active gentleman who has mild coronary calcification and nonobstructive stenoses demonstrated on a prior chest CT in 2008.  He has had normal myocardial perfusion studies and stress echo studies in the past. His most recent nuclear study in February 2016 was unchanged and showed normal perfusion without scar or ischemia.  His blood pressure today was mildly increased and by the nurse and 152/82 and had  improved to 140/84 when rechecked by me on Lotrel 5/10 and atenolol 12.5 mg daily. He will monitor his blood pressure at home.  If he notes that his systolic blood pressure is consistently above 140 .  He may need further titration of his Lotrel to 5/20 mg from his present dose of 5/10 mg. His lipids have improved on atorvastatin 80 mg, Zetia 10 mg, and omega-3 fatty acids.  He is to exercise regularly.  He denies change in symptomatology.  He denies further episodes of chest pain.  I reviewed his laboratory in detail with him.  Triglycerides remain slightly elevated, but again are significantly improved from previously.  He will continue current therapy as prescribed.    He will notify me regarding his blood pressure.  I will see him in one year for reevaluation or sooner if problems arise.  Time spent: 25 minutes Troy Sine, MD, [facility] 02/12/2016 5:44 PM

## 2016-02-12 ENCOUNTER — Ambulatory Visit (INDEPENDENT_AMBULATORY_CARE_PROVIDER_SITE_OTHER): Payer: 59 | Admitting: Cardiovascular Disease

## 2016-02-12 ENCOUNTER — Encounter: Payer: Self-pay | Admitting: Cardiovascular Disease

## 2016-02-12 VITALS — BP 152/82 | HR 64 | Ht 70.0 in | Wt 179.8 lb

## 2016-02-12 DIAGNOSIS — I10 Essential (primary) hypertension: Secondary | ICD-10-CM | POA: Diagnosis not present

## 2016-02-12 DIAGNOSIS — R079 Chest pain, unspecified: Secondary | ICD-10-CM | POA: Diagnosis not present

## 2016-02-12 DIAGNOSIS — I2510 Atherosclerotic heart disease of native coronary artery without angina pectoris: Secondary | ICD-10-CM

## 2016-02-12 DIAGNOSIS — E785 Hyperlipidemia, unspecified: Secondary | ICD-10-CM | POA: Diagnosis not present

## 2016-02-12 NOTE — Patient Instructions (Signed)
Your physician wants you to follow-up in: 1 year or sooner if needed. You will receive a reminder letter in the mail two months in advance. If you don't receive a letter, please call our office to schedule the follow-up appointment.   If you need a refill on your cardiac medications before your next appointment, please call your pharmacy.   

## 2016-03-27 ENCOUNTER — Ambulatory Visit (INDEPENDENT_AMBULATORY_CARE_PROVIDER_SITE_OTHER): Payer: 59 | Admitting: Urology

## 2016-03-27 DIAGNOSIS — N401 Enlarged prostate with lower urinary tract symptoms: Secondary | ICD-10-CM

## 2016-03-27 DIAGNOSIS — N410 Acute prostatitis: Secondary | ICD-10-CM | POA: Diagnosis not present

## 2016-03-27 DIAGNOSIS — R351 Nocturia: Secondary | ICD-10-CM

## 2016-04-05 ENCOUNTER — Other Ambulatory Visit: Payer: Self-pay | Admitting: Cardiovascular Disease

## 2016-06-05 ENCOUNTER — Other Ambulatory Visit: Payer: Self-pay | Admitting: Cardiovascular Disease

## 2016-07-09 DIAGNOSIS — G8929 Other chronic pain: Secondary | ICD-10-CM | POA: Diagnosis not present

## 2016-07-09 DIAGNOSIS — M25562 Pain in left knee: Secondary | ICD-10-CM | POA: Diagnosis not present

## 2016-07-20 DIAGNOSIS — G8929 Other chronic pain: Secondary | ICD-10-CM | POA: Diagnosis not present

## 2016-07-20 DIAGNOSIS — M25562 Pain in left knee: Secondary | ICD-10-CM | POA: Diagnosis not present

## 2016-08-19 DIAGNOSIS — N411 Chronic prostatitis: Secondary | ICD-10-CM | POA: Diagnosis not present

## 2016-08-26 DIAGNOSIS — R102 Pelvic and perineal pain: Secondary | ICD-10-CM | POA: Diagnosis not present

## 2016-08-26 DIAGNOSIS — M6281 Muscle weakness (generalized): Secondary | ICD-10-CM | POA: Diagnosis not present

## 2016-08-26 DIAGNOSIS — M62838 Other muscle spasm: Secondary | ICD-10-CM | POA: Diagnosis not present

## 2016-10-01 ENCOUNTER — Other Ambulatory Visit: Payer: Self-pay | Admitting: Cardiovascular Disease

## 2016-10-01 NOTE — Telephone Encounter (Signed)
Rx(s) sent to pharmacy electronically.  

## 2016-11-03 DIAGNOSIS — B079 Viral wart, unspecified: Secondary | ICD-10-CM | POA: Diagnosis not present

## 2016-11-03 DIAGNOSIS — D229 Melanocytic nevi, unspecified: Secondary | ICD-10-CM | POA: Diagnosis not present

## 2016-11-03 DIAGNOSIS — L821 Other seborrheic keratosis: Secondary | ICD-10-CM | POA: Diagnosis not present

## 2016-12-02 ENCOUNTER — Other Ambulatory Visit (HOSPITAL_COMMUNITY): Payer: Self-pay | Admitting: Oncology

## 2016-12-02 DIAGNOSIS — E785 Hyperlipidemia, unspecified: Secondary | ICD-10-CM

## 2016-12-11 ENCOUNTER — Telehealth: Payer: Self-pay | Admitting: Cardiovascular Disease

## 2016-12-11 DIAGNOSIS — Z79899 Other long term (current) drug therapy: Secondary | ICD-10-CM

## 2016-12-11 NOTE — Telephone Encounter (Signed)
I spoke to [name] [name], I told him to come in 1 week before his appointment for his blood work. I also told him to make sure he was fasting as well.

## 2016-12-11 NOTE — Telephone Encounter (Signed)
New message    Pt is calling asking if he needs blood work before his appt in October.

## 2016-12-17 DIAGNOSIS — R05 Cough: Secondary | ICD-10-CM | POA: Diagnosis not present

## 2016-12-17 DIAGNOSIS — N400 Enlarged prostate without lower urinary tract symptoms: Secondary | ICD-10-CM | POA: Diagnosis not present

## 2016-12-17 DIAGNOSIS — Z299 Encounter for prophylactic measures, unspecified: Secondary | ICD-10-CM | POA: Diagnosis not present

## 2016-12-17 DIAGNOSIS — Z713 Dietary counseling and surveillance: Secondary | ICD-10-CM | POA: Diagnosis not present

## 2017-02-15 ENCOUNTER — Other Ambulatory Visit: Payer: Self-pay | Admitting: Cardiovascular Disease

## 2017-02-15 MED ORDER — AMLODIPINE BESY-BENAZEPRIL HCL 5-10 MG PO CAPS: 1.0000 | ORAL_CAPSULE | Freq: Every day | ORAL | 1 refills | Status: DC

## 2017-02-15 NOTE — Telephone Encounter (Signed)
Rx(s) sent to pharmacy electronically.  

## 2017-03-01 ENCOUNTER — Telehealth: Payer: Self-pay | Admitting: Cardiovascular Disease

## 2017-03-01 NOTE — Telephone Encounter (Signed)
F/u Message ° °Pt returning RN call. Please call back to discuss  °

## 2017-03-01 NOTE — Telephone Encounter (Signed)
Duplicate message. 

## 2017-03-01 NOTE — Telephone Encounter (Signed)
Active orders in, left message to call back

## 2017-03-01 NOTE — Telephone Encounter (Signed)
New Message   Pt states he is supposed to come in for labs before his appt. Wants to know what labs he needs and if he needs to fast. I see orders from July. Which labs need to be scheduled?

## 2017-03-02 NOTE — Telephone Encounter (Signed)
Left message for patient of the lab work ordered. Lab hours and location information given. Patient was told to be fasting.

## 2017-03-04 ENCOUNTER — Other Ambulatory Visit: Payer: Self-pay | Admitting: Cardiovascular Disease

## 2017-03-04 DIAGNOSIS — Z79899 Other long term (current) drug therapy: Secondary | ICD-10-CM | POA: Diagnosis not present

## 2017-03-04 DIAGNOSIS — E785 Hyperlipidemia, unspecified: Secondary | ICD-10-CM

## 2017-03-04 LAB — CBC
Hematocrit: 41.2 % (ref 37.5–51.0)
Hemoglobin: 14.9 g/dL (ref 13.0–17.7)
MCH: 32.2 pg (ref 26.6–33.0)
MCHC: 36.2 g/dL — ABNORMAL HIGH (ref 31.5–35.7)
MCV: 89 fL (ref 79–97)
Platelets: 213 10*3/uL (ref 150–379)
RBC: 4.63 x10E6/uL (ref 4.14–5.80)
RDW: 13.2 % (ref 12.3–15.4)
WBC: 6.9 10*3/uL (ref 3.4–10.8)

## 2017-03-04 LAB — COMPREHENSIVE METABOLIC PANEL
ALT: 47 IU/L — ABNORMAL HIGH (ref 0–44)
AST: 31 IU/L (ref 0–40)
Albumin/Globulin Ratio: 2 (ref 1.2–2.2)
Albumin: 4.7 g/dL (ref 3.6–4.8)
Alkaline Phosphatase: 72 IU/L (ref 39–117)
BUN/Creatinine Ratio: 13 (ref 10–24)
BUN: 12 mg/dL (ref 8–27)
Bilirubin Total: 1 mg/dL (ref 0.0–1.2)
CO2: 22 mmol/L (ref 20–29)
Calcium: 9.5 mg/dL (ref 8.6–10.2)
Chloride: 102 mmol/L (ref 96–106)
Creatinine, Ser: 0.95 mg/dL (ref 0.76–1.27)
GFR calc Af Amer: 100 mL/min/{1.73_m2} (ref >59)
GFR calc non Af Amer: 87 mL/min/{1.73_m2} (ref >59)
Globulin, Total: 2.4 g/dL (ref 1.5–4.5)
Glucose: 104 mg/dL — ABNORMAL HIGH (ref 65–99)
Potassium: 4.1 mmol/L (ref 3.5–5.2)
Sodium: 140 mmol/L (ref 134–144)
Total Protein: 7.1 g/dL (ref 6.0–8.5)

## 2017-03-04 LAB — LIPID PANEL W/O CHOL/HDL RATIO
Cholesterol, Total: 146 mg/dL (ref 100–199)
HDL: 61 mg/dL (ref >39)
LDL Calculated: 66 mg/dL (ref 0–99)
Triglycerides: 97 mg/dL (ref 0–149)
VLDL Cholesterol Cal: 19 mg/dL (ref 5–40)

## 2017-03-04 LAB — PSA: Prostate Specific Ag, Serum: 0.8 ng/mL (ref 0.0–4.0)

## 2017-03-04 LAB — TSH: TSH: 1.28 u[IU]/mL (ref 0.450–4.500)

## 2017-03-05 NOTE — Telephone Encounter (Signed)
REFILL 

## 2017-03-08 DIAGNOSIS — N411 Chronic prostatitis: Secondary | ICD-10-CM | POA: Diagnosis not present

## 2017-03-10 ENCOUNTER — Ambulatory Visit: Payer: 59 | Admitting: Cardiovascular Disease

## 2017-03-29 DIAGNOSIS — N411 Chronic prostatitis: Secondary | ICD-10-CM | POA: Diagnosis not present

## 2017-04-07 DIAGNOSIS — N403 Nodular prostate with lower urinary tract symptoms: Secondary | ICD-10-CM | POA: Diagnosis not present

## 2017-04-07 DIAGNOSIS — N410 Acute prostatitis: Secondary | ICD-10-CM | POA: Diagnosis not present

## 2017-04-07 DIAGNOSIS — R109 Unspecified abdominal pain: Secondary | ICD-10-CM | POA: Diagnosis not present

## 2017-04-23 ENCOUNTER — Other Ambulatory Visit: Payer: Self-pay | Admitting: Cardiovascular Disease

## 2017-04-23 NOTE — Telephone Encounter (Signed)
Rx(s) sent to pharmacy electronically.  

## 2017-05-06 ENCOUNTER — Ambulatory Visit: Payer: 59 | Admitting: Cardiovascular Disease

## 2017-05-06 ENCOUNTER — Encounter: Payer: Self-pay | Admitting: Cardiovascular Disease

## 2017-05-06 VITALS — BP 126/76 | HR 62 | Ht 70.0 in | Wt 177.4 lb

## 2017-05-06 DIAGNOSIS — E782 Mixed hyperlipidemia: Secondary | ICD-10-CM

## 2017-05-06 DIAGNOSIS — I10 Essential (primary) hypertension: Secondary | ICD-10-CM

## 2017-05-06 DIAGNOSIS — I2510 Atherosclerotic heart disease of native coronary artery without angina pectoris: Secondary | ICD-10-CM | POA: Diagnosis not present

## 2017-05-06 DIAGNOSIS — N419 Inflammatory disease of prostate, unspecified: Secondary | ICD-10-CM | POA: Diagnosis not present

## 2017-05-06 NOTE — Patient Instructions (Addendum)

## 2017-05-06 NOTE — Progress Notes (Signed)
Patient ID: SIMS LADAY, male   DOB: 02-12-1956, 61 y.o.   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 Remotely he had been followed by Drs. Brackbill and deGent.    I first saw Mr Marohl on 10/27/2012 when he presented to establish cardiology care with me and expressed his interest in aggressive preventive cardiology assessment. He has a strong family history for heart disease in both parents as well as grandparents. He has a history of hypertension and hyperlipidemia.  Remotely when followed by Dr. Mare Ferrari he had undergone several myocardial perfusion studies.  In 2006 and 2007 he also underwent several [facility] heart lab evaluations. In September 2006 his cholesterol was 188, LDL 90, HDL 40 but his triglycerides were 292. He is LDL III a + b. percent was significantly increased at 50.5. HDL2b  percent was low at 11. He has been on statin plus zetia for lipid lowering therapy. In 2008 he began to be followed by Dr. Lesle Reek. A chest CT revealed a calcium score 194. He did have mild coronary obstructive disease with less than 50% calcific lesion noted in the mid LAD and less than 30% calcific disease in the proximal and mid RCA.  Subsequently, he has undergone an exercise stress echo study in November 2011 which was normal. The patient has remained asymptomatic and most of his studies were screening evaluations for CAD. Since Dr. Lesle Reek has left his practice in [place], Mr. Nyborg has selected me to pursue his aggressive preventive cardiology evaluation.  Mr. Coon remains very physically active. He exercises regularly and works out with a trainer at least 2 days per week. This past year he skied both at [place], [place] and [place], [place] without cardiovascular problems. He also does a ladder protocol on the elliptical trainer he has a 7 handicap in golf. He was seen in the emergency room at [facility] for some symptoms of chest sensation in October 2013.   When I saw him initially, I recommended a complete set of laboratory including an NMR lipoprotein of with lipid panel. In addition I also recommended that he undergo a cardiopulmonary met test to assess for not only macrovascular angina but potential microvascular ischemia or endothelial dysfunction findings. Cardiopulmonary met test was done on 11/21/2012. This revealed a slightly reduced peak maximum oxygen consumption 80% of predicted he did have a low peak stroke volume. His anaerobic threshold was in the normal range at 13.7 mL's per kilogram per minute. Abnormal pulmonary status. Laboratory revealed a fasting glucose of 105. LDL cholesterol is excellent at 61 with an LDL particle number 01/13/1996 however, his triglycerides were still elevated at 325 and increased large VLDL of 9.5 His LDL particles were 631. Had a normal HDL of 46 and normal HDL particle number 39.4. His insulin resistance score was increased at 57.  On 03/28/2013, laboratory revealed a fasting glucose of 102. Bilirubin was 1.3. He had normal SGOT and SGPT. LDL particle number was excellent at 824, LDL 63, triglycerides were markedly improved but still minimally elevated at 169. Large LDL was also mildly elevated at 6.4 as was small LDL particle number at 654. HDL cholesterol was 47. Insulin resistance score was somewhat improved but again still mildly elevated at 52.  Since I last saw him in May 2015, he has continued to be fairly active.  However, recently he admits to experiencing 4-5 episodes  of a chest pressure sensation.  This was not always precipitated by activity.  He had gone skiing and was able to ski but did note some sharp twinges of chest discomfort.  Because of his significant family history for premature coronary artery disease and with significant disease in all 4 grandparents, father and mother, he was concerned about these episodes of chest pain and  presents for evaluation.  A follow-up NMR lipoprotein on 07/05/2014 showed a total cholesterol of 187, triglycerides were mildly elevated at 173.  HDL cholesterol is excellent at 59 and LDL cholesterol was 93.  He had 566 small LDL particles in his LDL particle number was 1211.  Insulin resistance score was slightly elevated at 59, which had increased from 46 9 months previously.  In early 2016 he had complained of some very mild episodes of vague chest sensation.  Remotely, a chest CT revealed a calcium score 194 and he had mild coronary obstructive disease.  With his  chest pain development I recommended that he undergo an exercise Myoview study.  This was done on 07/24/2014 and revealed normal perfusion without scar or ischemia.  Post stress ejection fraction was 64%.  He uunderwent follow-up lipid panel on 10/30/2014.  This was improved with a total cholesterol 151, triglycerides 152, HDL 51, LDL 70.  When I saw him last year one year ago he was exercising anywhere from 5-7 days per week , typically does cardio alternating with weights.  He had markedly reduced his intake.  At least one meal per day as a salad with protein.  Since I last saw him, he states that there is essentially been no change.  He continues to be active.  He denies any episodes of chest pressure or palpitations.  He admits to stable bood pressure on amlodipine/benazepril 5/10 , and atenolol 25 mg daily. He underwent repeat laboratory 2 months ago which showed a fasting glucose of 104.  His CBC was normal.  Lipid studies were improved with a total cholesterol 146, triglycerides 97, HDL 61, VLDL 19, and LDL cholesterol 66.  He is on atorvastatin 40 mg and Zetia 10 mg in addition to omega-3 fatty acid.  TSH was normal.  Very minimal increase in ALT at 47.  He presents for evaluation.   Past Medical History:  Diagnosis Date  . Back problem   . Coronary artery disease (CAD) excluded    status post cardiac CT scan with only mild  plaquing and no obstructive lesions 2006status post stress echo, ejection fraction 55% able to achieved 10  METS  . Dyslipidemia   . History of MRSA infection   . Hypertriglyceridemia     Past Surgical History:  Procedure Laterality Date  . BACK SURGERY      No Known Allergies  Current Outpatient Medications  Medication Sig Dispense Refill  . amLODipine-benazepril (LOTREL) 5-10 MG capsule TAKE ONE CAPSULE BY MOUTH DAILY 30 capsule 0  . aspirin 81 MG EC tablet Take 81 mg by mouth daily.      Marland Kitchen atenolol (TENORMIN) 25 MG tablet Take 0.5 tablets (12.5 mg total) by mouth daily. (Patient taking differently: Take 6.25 mg by mouth daily. ) 15 tablet 5  . atorvastatin (LIPITOR) 80 MG tablet Take 40 mg by mouth daily. Pt takes 40 mg daily     . Coenzyme Q10 (COQ10 PO) Take by mouth.    . Dutasteride-Tamsulosin HCl (JALYN) 0.5-0.4 MG CAPS Take 1 capsule by mouth daily.      Marland Kitchen ezetimibe (  ZETIA) 10 MG tablet Take 1 tablet (10 mg total) by mouth daily. KEEP OV. (Patient taking differently: Take 5 mg by mouth daily. KEEP OV.) 90 tablet 0  . Multiple Vitamin (MULTIVITAMIN) capsule Take 1 capsule by mouth daily.      . Omega-3 1400 MG CAPS Take 2 capsules by mouth 2 (two) times daily.      No current facility-administered medications for this visit.    Socially, he is married for 28 years. He works in Psychologist, forensic. He did his undergraduate degree at [employer]. 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 There is no tobacco history. He does drink alcohol socially. He remains active with athletics.  Family History  Problem Relation Age of Onset  . Heart disease Unknown   . Hypertension Unknown   . Stroke Unknown   . Heart attack Father   . Hypertension Father   . Hyperlipidemia Father   . Heart attack Mother   . Hypertension Mother   . Hyperlipidemia Mother    ROS General: Negative; No fevers, chills, or night sweats; there is no change in weight HEENT: Negative;  No changes in vision or hearing, sinus congestion, difficulty swallowing Pulmonary: He is status post recent allergy and treatment with Z-Pak.; No cough, wheezing, shortness of breath, hemoptysis Cardiovascular: Negative; No chest pain, presyncope, syncope, palpatations GI: Negative; No nausea, vomiting, diarrhea, or abdominal pain GU: Negative; No dysuria, hematuria, or difficulty voiding Musculoskeletal: Negative; no myalgias, joint pain, or weakness Hematologic/Oncology: Negative; no easy bruising, bleeding Endocrine: Negative; no heat/cold intolerance; no diabetes Neuro: Negative; no changes in balance, headaches Skin: Negative; No rashes or skin lesions Psychiatric: Negative; No behavioral problems, depression Sleep: Negative; No snoring, daytime sleepiness, hypersomnolence, bruxism, restless legs, hypnogognic hallucinations, no cataplexy Other comprehensive 14 point system review is negative.   PE BP 126/76   Pulse 62   Ht '5\' 10"'  (1.778 m)   Wt 177 lb 6.4 oz (80.5 kg)   BMI 25.45 kg/m    Repeat blood pressure by me was 128/78.  Wt Readings from Last 3 Encounters:  05/06/17 177 lb 6.4 oz (80.5 kg)  02/12/16 179 lb 12.8 oz (81.6 kg)  11/06/14 189 lb 12.8 oz (86.1 kg)   General: Alert, oriented, no distress.  Skin: normal turgor, no rashes, warm and dry HEENT: Normocephalic, atraumatic. Pupils equal round and reactive to light; sclera anicteric; extraocular muscles intact;  Nose without nasal septal hypertrophy Mouth/Parynx benign; Mallinpatti scale 2 Neck: No JVD, no carotid bruits; normal carotid upstroke Lungs: clear to ausculatation and percussion; no wheezing or rales Chest wall: without tenderness to palpitation Heart: PMI not displaced, RRR, s1 s2 normal, 1/6 systolic murmur, no diastolic murmur, no rubs, gallops, thrills, or heaves Abdomen: soft, nontender; no hepatosplenomehaly, BS+; abdominal aorta nontender and not dilated by palpation. Back: no CVA  tenderness Pulses 2+ Musculoskeletal: full range of motion, normal strength, no joint deformities Extremities: no clubbing cyanosis or edema, Homan's sign negative  Neurologic: grossly nonfocal; Cranial nerves grossly wnl Psychologic: Normal mood and affect   ECG (independently read by me): Normal sinus rhythm at 62 bpm.  No ectopy.  Normal intervals.  September 2017 ECG (independently read by me): Normal sinus rhythm at 64 bpm.  Borderline voltage criteria for LVH.   No significant ST changes.  Normal intervals.  September 2016 ECG (independently read by me): normal sinus rhythm at 61 bpm.  Normal intervals.  No ST segment changes.  February 2016 ECG (  independently read by me): Normal sinus rhythm at 72 bpm.  No significant ST segment changes.  Normal intervals.  May 2015 ECG (independently read by me) sinus bradycardia 57 beats per minute.  No ectopy.  Normal intervals.  ECG: Normal sinus rhythm at 60 with voltage criteria for LVH.  LABS: BMP Latest Ref Rng & Units 03/04/2017 02/07/2016 10/30/2014  Glucose 65 - 99 mg/dL 104(H) 95 99  BUN 8 - 27 mg/dL '12 11 12  ' Creatinine 0.76 - 1.27 mg/dL 0.95 0.95 0.94  BUN/Creat Ratio 10 - 24 13 - -  Sodium 134 - 144 mmol/L 140 138 141  Potassium 3.5 - 5.2 mmol/L 4.1 4.2 4.1  Chloride 96 - 106 mmol/L 102 102 104  CO2 20 - 29 mmol/L '22 28 29  ' Calcium 8.6 - 10.2 mg/dL 9.5 9.5 9.3   Hepatic Function Latest Ref Rng & Units 03/04/2017 02/07/2016 10/30/2014  Total Protein 6.0 - 8.5 g/dL 7.1 7.1 7.2  Albumin 3.6 - 4.8 g/dL 4.7 4.6 4.3  AST 0 - 40 IU/L '31 26 27  ' ALT 0 - 44 IU/L 47(H) 35 35  Alk Phosphatase 39 - 117 IU/L 72 62 67  Total Bilirubin 0.0 - 1.2 mg/dL 1.0 1.5(H) 1.4(H)   CBC Latest Ref Rng & Units 03/04/2017 02/07/2016 07/05/2014  WBC 3.4 - 10.8 x10E3/uL 6.9 6.4 7.2  Hemoglobin 13.0 - 17.7 g/dL 14.9 15.2 15.2  Hematocrit 37.5 - 51.0 % 41.2 44.1 43.2  Platelets 150 - 379 x10E3/uL 213 203 228   Lab Results  Component Value Date   MCV 89  03/04/2017   MCV 93.0 02/07/2016   MCV 93.3 07/05/2014   Lab Results  Component Value Date   TSH 1.280 03/04/2017    No results found for: HGBA1C   Lipid Panel     Component Value Date/Time   CHOL 146 03/04/2017 0927   CHOL 187 07/05/2014 1218   TRIG 97 03/04/2017 0927   TRIG 173 (H) 07/05/2014 1218   HDL 61 03/04/2017 0927   HDL 59 07/05/2014 1218   CHOLHDL 2.7 02/07/2016 1504   VLDL 36 (H) 02/07/2016 1504   LDLCALC 66 03/04/2017 0927   LDLCALC 93 07/05/2014 1218    IMPRESSION:  1. Mixed hyperlipidemia   2. Essential hypertension   3. Prostatitis, unspecified prostatitis type   4. Atherosclerosis of native coronary artery of native heart without angina pectoris   5. Coronary artery calcification seen on CT scan in 2008     ASSESSMENT AND PLAN:  Mr. Billups has is a 61 year old active gentleman who has mild coronary calcification and nonobstructive stenoses demonstrated on a prior chest CT in 2008.  He has had normal myocardial perfusion studies and stress echo studies in the past. His most recent nuclear study in February 2016 was unchanged and showed normal perfusion without scar or ischemia.  .  Currently, he has been taking amlodipine/benazepril 5/10 , and atenolol 12.5 mg daily.  His blood pressure today is excellent.  He was wondering whether or not in the future.  He may be able to potentially be taken off this medication, but I have educated him on the new hypertensive guidelines and that his current blood pressure is ideal on current therapy.  We also discussed that for every 20/10, increased from 115/75, there is a doubling of cardiovascular risk.  He continues to exercise and seems to have good cardiovascular lifestyle.  He has adjusted his diet and this has resulted in significant improvement in his previous  significant triglyceride elevation.  His most recent lipid studies are excellent on atorvastatin 40 mg, Zetia 10 mg and 1400 mg of omega-3 fatty acid with 2 capsules  twice a day.  I discussed with him the recent Reduce-ittrial with Vascepa.  He takes [place] for prostate issues and has normal flow.  BMI is excellent at 25.45.  He will continue with his current medical regimen.  I will see him in one year for reevaluation or sooner if problems arise.  Time spent: 25 minutes Troy Sine, MD, [facility] 05/07/2017 6:28 AM

## 2017-05-07 ENCOUNTER — Encounter: Payer: Self-pay | Admitting: Cardiovascular Disease

## 2017-05-29 ENCOUNTER — Other Ambulatory Visit: Payer: Self-pay | Admitting: Cardiovascular Disease

## 2017-05-29 DIAGNOSIS — E785 Hyperlipidemia, unspecified: Secondary | ICD-10-CM

## 2017-05-31 ENCOUNTER — Other Ambulatory Visit: Payer: Self-pay | Admitting: Cardiovascular Disease

## 2017-05-31 NOTE — Telephone Encounter (Signed)
REFILL 

## 2017-06-18 ENCOUNTER — Other Ambulatory Visit: Payer: Self-pay | Admitting: Cardiovascular Disease

## 2017-06-23 DIAGNOSIS — N403 Nodular prostate with lower urinary tract symptoms: Secondary | ICD-10-CM | POA: Diagnosis not present

## 2017-06-23 DIAGNOSIS — R1033 Periumbilical pain: Secondary | ICD-10-CM | POA: Diagnosis not present

## 2017-06-23 DIAGNOSIS — K5730 Diverticulosis of large intestine without perforation or abscess without bleeding: Secondary | ICD-10-CM | POA: Diagnosis not present

## 2017-06-23 DIAGNOSIS — Z8371 Family history of colonic polyps: Secondary | ICD-10-CM | POA: Diagnosis not present

## 2017-06-30 DIAGNOSIS — N403 Nodular prostate with lower urinary tract symptoms: Secondary | ICD-10-CM | POA: Diagnosis not present

## 2017-06-30 DIAGNOSIS — N411 Chronic prostatitis: Secondary | ICD-10-CM | POA: Diagnosis not present

## 2017-08-09 DIAGNOSIS — R933 Abnormal findings on diagnostic imaging of other parts of digestive tract: Secondary | ICD-10-CM | POA: Diagnosis not present

## 2017-08-09 DIAGNOSIS — K635 Polyp of colon: Secondary | ICD-10-CM | POA: Diagnosis not present

## 2017-08-09 DIAGNOSIS — K5730 Diverticulosis of large intestine without perforation or abscess without bleeding: Secondary | ICD-10-CM | POA: Diagnosis not present

## 2017-08-23 DIAGNOSIS — R05 Cough: Secondary | ICD-10-CM | POA: Diagnosis not present

## 2017-08-23 DIAGNOSIS — S29019A Strain of muscle and tendon of unspecified wall of thorax, initial encounter: Secondary | ICD-10-CM | POA: Diagnosis not present

## 2017-09-30 ENCOUNTER — Other Ambulatory Visit: Payer: Self-pay | Admitting: Cardiovascular Disease

## 2017-09-30 DIAGNOSIS — E785 Hyperlipidemia, unspecified: Secondary | ICD-10-CM

## 2017-09-30 NOTE — Telephone Encounter (Signed)
refill 

## 2017-10-04 DIAGNOSIS — R102 Pelvic and perineal pain: Secondary | ICD-10-CM | POA: Diagnosis not present

## 2017-11-16 DIAGNOSIS — D229 Melanocytic nevi, unspecified: Secondary | ICD-10-CM | POA: Diagnosis not present

## 2017-11-16 DIAGNOSIS — C4491 Basal cell carcinoma of skin, unspecified: Secondary | ICD-10-CM

## 2017-11-16 DIAGNOSIS — L959 Vasculitis limited to the skin, unspecified: Secondary | ICD-10-CM | POA: Diagnosis not present

## 2017-11-16 DIAGNOSIS — C44519 Basal cell carcinoma of skin of other part of trunk: Secondary | ICD-10-CM | POA: Diagnosis not present

## 2017-11-16 HISTORY — DX: Basal cell carcinoma of skin, unspecified: C44.91

## 2018-01-04 DIAGNOSIS — H9201 Otalgia, right ear: Secondary | ICD-10-CM | POA: Diagnosis not present

## 2018-01-06 DIAGNOSIS — C44519 Basal cell carcinoma of skin of other part of trunk: Secondary | ICD-10-CM | POA: Diagnosis not present

## 2018-03-31 DIAGNOSIS — S338XXA Sprain of other parts of lumbar spine and pelvis, initial encounter: Secondary | ICD-10-CM | POA: Diagnosis not present

## 2018-03-31 DIAGNOSIS — M47816 Spondylosis without myelopathy or radiculopathy, lumbar region: Secondary | ICD-10-CM | POA: Diagnosis not present

## 2018-04-01 DIAGNOSIS — M47816 Spondylosis without myelopathy or radiculopathy, lumbar region: Secondary | ICD-10-CM | POA: Diagnosis not present

## 2018-04-01 DIAGNOSIS — S338XXA Sprain of other parts of lumbar spine and pelvis, initial encounter: Secondary | ICD-10-CM | POA: Diagnosis not present

## 2018-04-06 DIAGNOSIS — S338XXA Sprain of other parts of lumbar spine and pelvis, initial encounter: Secondary | ICD-10-CM | POA: Diagnosis not present

## 2018-04-06 DIAGNOSIS — M47816 Spondylosis without myelopathy or radiculopathy, lumbar region: Secondary | ICD-10-CM | POA: Diagnosis not present

## 2018-04-07 DIAGNOSIS — S338XXA Sprain of other parts of lumbar spine and pelvis, initial encounter: Secondary | ICD-10-CM | POA: Diagnosis not present

## 2018-04-07 DIAGNOSIS — M47816 Spondylosis without myelopathy or radiculopathy, lumbar region: Secondary | ICD-10-CM | POA: Diagnosis not present

## 2018-04-12 DIAGNOSIS — S338XXA Sprain of other parts of lumbar spine and pelvis, initial encounter: Secondary | ICD-10-CM | POA: Diagnosis not present

## 2018-04-12 DIAGNOSIS — M47816 Spondylosis without myelopathy or radiculopathy, lumbar region: Secondary | ICD-10-CM | POA: Diagnosis not present

## 2018-04-18 DIAGNOSIS — D229 Melanocytic nevi, unspecified: Secondary | ICD-10-CM | POA: Diagnosis not present

## 2018-05-02 ENCOUNTER — Other Ambulatory Visit: Payer: Self-pay

## 2018-05-02 DIAGNOSIS — Z79899 Other long term (current) drug therapy: Secondary | ICD-10-CM

## 2018-05-02 DIAGNOSIS — N419 Inflammatory disease of prostate, unspecified: Secondary | ICD-10-CM

## 2018-05-02 DIAGNOSIS — E782 Mixed hyperlipidemia: Secondary | ICD-10-CM | POA: Diagnosis not present

## 2018-05-02 DIAGNOSIS — I10 Essential (primary) hypertension: Secondary | ICD-10-CM | POA: Diagnosis not present

## 2018-05-03 LAB — CBC
Hematocrit: 45.1 % (ref 37.5–51.0)
Hemoglobin: 15.2 g/dL (ref 13.0–17.7)
MCH: 30.3 pg (ref 26.6–33.0)
MCHC: 33.7 g/dL (ref 31.5–35.7)
MCV: 90 fL (ref 79–97)
Platelets: 219 10*3/uL (ref 150–450)
RBC: 5.02 x10E6/uL (ref 4.14–5.80)
RDW: 12.9 % (ref 12.3–15.4)
WBC: 6.2 10*3/uL (ref 3.4–10.8)

## 2018-05-03 LAB — COMPREHENSIVE METABOLIC PANEL
ALT: 43 IU/L (ref 0–44)
AST: 29 IU/L (ref 0–40)
Albumin/Globulin Ratio: 2 (ref 1.2–2.2)
Albumin: 4.7 g/dL (ref 3.6–4.8)
Alkaline Phosphatase: 78 IU/L (ref 39–117)
BUN/Creatinine Ratio: 12 (ref 10–24)
BUN: 12 mg/dL (ref 8–27)
Bilirubin Total: 1.2 mg/dL (ref 0.0–1.2)
CO2: 23 mmol/L (ref 20–29)
Calcium: 9.6 mg/dL (ref 8.6–10.2)
Chloride: 101 mmol/L (ref 96–106)
Creatinine, Ser: 1.02 mg/dL (ref 0.76–1.27)
GFR calc Af Amer: 91 mL/min/{1.73_m2} (ref >59)
GFR calc non Af Amer: 78 mL/min/{1.73_m2} (ref >59)
Globulin, Total: 2.3 g/dL (ref 1.5–4.5)
Glucose: 95 mg/dL (ref 65–99)
Potassium: 4.1 mmol/L (ref 3.5–5.2)
Sodium: 140 mmol/L (ref 134–144)
Total Protein: 7 g/dL (ref 6.0–8.5)

## 2018-05-03 LAB — PSA: Prostate Specific Ag, Serum: 0.7 ng/mL (ref 0.0–4.0)

## 2018-05-03 LAB — LIPID PANEL
Chol/HDL Ratio: 3.2 ratio (ref 0.0–5.0)
Cholesterol, Total: 164 mg/dL (ref 100–199)
HDL: 52 mg/dL (ref >39)
LDL Calculated: 88 mg/dL (ref 0–99)
Triglycerides: 120 mg/dL (ref 0–149)
VLDL Cholesterol Cal: 24 mg/dL (ref 5–40)

## 2018-05-03 LAB — TSH: TSH: 1.61 u[IU]/mL (ref 0.450–4.500)

## 2018-05-09 ENCOUNTER — Encounter: Payer: Self-pay | Admitting: Cardiovascular Disease

## 2018-05-09 ENCOUNTER — Ambulatory Visit: Payer: 59 | Admitting: Cardiovascular Disease

## 2018-05-09 VITALS — BP 162/80 | HR 68 | Ht 70.0 in | Wt 181.2 lb

## 2018-05-09 DIAGNOSIS — I10 Essential (primary) hypertension: Secondary | ICD-10-CM | POA: Diagnosis not present

## 2018-05-09 DIAGNOSIS — N419 Inflammatory disease of prostate, unspecified: Secondary | ICD-10-CM

## 2018-05-09 DIAGNOSIS — I2510 Atherosclerotic heart disease of native coronary artery without angina pectoris: Secondary | ICD-10-CM | POA: Diagnosis not present

## 2018-05-09 DIAGNOSIS — E782 Mixed hyperlipidemia: Secondary | ICD-10-CM

## 2018-05-09 NOTE — Patient Instructions (Signed)
Medication Instructions:  Your physician recommends that you continue on your current medications as directed. Please refer to the Current Medication list given to you today.  If you need a refill on your cardiac medications before your next appointment, please call your pharmacy.   Follow-Up: At [facility], you and your health needs are our priority.  As part of our continuing mission to provide you with exceptional heart care, we have created designated Provider Care Teams.  These Care Teams include your primary Cardiologist (physician) and Advanced Practice Providers (APPs -  Physician Assistants and Nurse Practitioners) who all work together to provide you with the care you need, when you need it. You will need a follow up appointment in 6 months.  Please call our office 2 months in advance to schedule this appointment.  You may see Dr. Kelly or one of the following Advanced Practice Providers on your designated Care Team: Hao Meng, PA-C . Angela Duke, PA-C  .  

## 2018-05-09 NOTE — Progress Notes (Signed)
Patient ID: PONCIANO SHEALY, male   DOB: 1956/01/20, 62 y.o.   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     Mr. Mccubbins in the past has been followed by Dr. Norva Karvonen and later Dr. Mare Ferrari.  I first saw Mr Vuncannon on 10/27/2012 when he presented to establish cardiology care with me and expressed his interest in aggressive preventive cardiology assessment. He has a strong family history for heart disease in both parents as well as grandparents. He has a history of hypertension and hyperlipidemia.  Remotely when followed by Dr. Mare Ferrari he had undergone several myocardial perfusion studies.  In 2006 and 2007 he also underwent several [facility] heart lab evaluations. In September 2006 his cholesterol was 188, LDL 90, HDL 40 but his triglycerides were 292. He is LDL III a + b. percent was significantly increased at 50.5. HDL2b  percent was low at 11. He has been on statin plus zetia for lipid lowering therapy. In 2008 he began to be followed by Dr. Lesle Reek. A chest CT revealed a calcium score 194. He did have mild coronary obstructive disease with less than 50% calcific lesion noted in the mid LAD and less than 30% calcific disease in the proximal and mid RCA.  Subsequently, he has undergone an exercise stress echo study in November 2011 which was normal. The patient has remained asymptomatic and most of his studies were screening evaluations for CAD. Since Dr. Lesle Reek has left his practice in [place], Mr. Feil has selected me to pursue his aggressive preventive cardiology evaluation.  Mr. Soffer remains very physically active. He exercises regularly and works out with a trainer at least 2 days per week. This past year he skied both at [place], [place] and [place], [place] without cardiovascular problems. He also does a ladder protocol on the elliptical trainer he has a 7 handicap in golf. He was seen in the emergency room  at [facility] for some symptoms of chest sensation in October 2013.   When I saw him initially, I recommended a complete set of laboratory including an NMR lipoprotein of with lipid panel. In addition I also recommended that he undergo a cardiopulmonary met test to assess for not only macrovascular angina but potential microvascular ischemia or endothelial dysfunction findings. Cardiopulmonary met test was done on 11/21/2012. This revealed a slightly reduced peak maximum oxygen consumption 80% of predicted he did have a low peak stroke volume. His anaerobic threshold was in the normal range at 13.7 mL's per kilogram per minute. Abnormal pulmonary status. Laboratory revealed a fasting glucose of 105. LDL cholesterol is excellent at 61 with an LDL particle number 01/13/1996 however, his triglycerides were still elevated at 325 and increased large VLDL of 9.5 His LDL particles were 631. Had a normal HDL of 46 and normal HDL particle number 39.4. His insulin resistance score was increased at 57.  On 03/28/2013, laboratory revealed a fasting glucose of 102. Bilirubin was 1.3. He had normal SGOT and SGPT. LDL particle number was excellent at 824, LDL 63, triglycerides were markedly improved but still minimally elevated at 169. Large LDL was also mildly elevated at 6.4 as was small LDL particle number at 654. HDL cholesterol was 47. Insulin resistance score was somewhat improved but again still mildly elevated at 52.  Since I last saw him in May 2015, he has continued to be fairly active.  However,  recently he admits to experiencing 4-5 episodes of a chest pressure sensation.  This was not always precipitated by activity.  He had gone skiing and was able to ski but did note some sharp twinges of chest discomfort.  Because of his significant family history for premature coronary artery disease and with significant disease in all 4 grandparents, father and mother, he was concerned about these episodes of  chest pain and presents for evaluation.  A follow-up NMR lipoprotein on 07/05/2014 showed a total cholesterol of 187, triglycerides were mildly elevated at 173.  HDL cholesterol is excellent at 59 and LDL cholesterol was 93.  He had 566 small LDL particles in his LDL particle number was 1211.  Insulin resistance score was slightly elevated at 59, which had increased from 46 9 months previously.  In early 2016 he had complained of some very mild episodes of vague chest sensation.  Remotely, a chest CT revealed a calcium score 194 and he had mild coronary obstructive disease.  With his  chest pain development I recommended that he undergo an exercise Myoview study.  This was done on 07/24/2014 and revealed normal perfusion without scar or ischemia.  Post stress ejection fraction was 64%.  He uunderwent follow-up lipid panel on 10/30/2014.  This was improved with a total cholesterol 151, triglycerides 152, HDL 51, LDL 70.  When I saw him last year one year ago he was exercising anywhere from 5-7 days per week , typically does cardio alternating with weights.  He had markedly reduced his intake.  At least one meal per day as a salad with protein.  I last saw him in November 2018 at which time he stated that he was doing well and remained stable and active.   He denied any episodes of chest pressure or palpitations.  He stated that his blood pressure was stable on  amlodipine/benazepril 5/10 , and atenolol 25 mg daily. Laboratory showed a fasting glucose of 104.  His CBC was normal. Lipid studies were improved with a total cholesterol 146, triglycerides 97, HDL 61, VLDL 19, and LDL cholesterol 66.  He is on atorvastatin 40 mg and Zetia 10 mg in addition to omega-3 fatty acid.  TSH was normal and there was  minimal increase in ALT at 47.  He has significantly adjusted his diet which resulted in marked improvement in his previous significant triglyceride elevation.  In that evaluation I also discussed the reduce it  trial with the CPAP.  He also was on Jalyn for prostate issues.  Over the past year, he has continued to do well.  However approximately 6 weeks ago he developed a significant episode of low back discomfort and during this time he could not exercise for approximately 4 weeks.  Subsequently, he is weak resumed his working out with a trainer on Mondays and Thursdays and does cardiac work at least 3 days/week with an elliptical at home.  He eats salads 4 days/week typically with fish for protein.  This past weekend was Thanksgiving.  He admits to eating more over the Thanksgiving break including foods with more sodium.  Last evening instead of his typical vegetable pizza, he ate a pizza with sausage and Canadian bacon.  He states his blood pressure typically has been running in the  115 to -128 range with diastolics 12-75.  He presents for evaluation   Past Medical History:  Diagnosis Date  . Back problem   . Coronary artery disease (CAD) excluded    status post  cardiac CT scan with only mild plaquing and no obstructive lesions 2006status post stress echo, ejection fraction 55% able to achieved 10  METS  . Dyslipidemia   . History of MRSA infection   . Hypertriglyceridemia     Past Surgical History:  Procedure Laterality Date  . BACK SURGERY      No Known Allergies  Current Outpatient Medications  Medication Sig Dispense Refill  . amLODipine-benazepril (LOTREL) 5-10 MG capsule TAKE ONE CAPSULE BY MOUTH DAILY 30 capsule 11  . aspirin 81 MG EC tablet Take 81 mg by mouth daily.      Marland Kitchen atenolol (TENORMIN) 25 MG tablet TAKE 1/2 TABLET BY MOUTH EVERY DAY 15 tablet 5  . atorvastatin (LIPITOR) 80 MG tablet TAKE 1 TABLET BY MOUTH EVERY DAY (Patient taking differently: Takes 40 mg) 90 tablet 3  . Coenzyme Q10 (COQ10 PO) Take by mouth.    . Dutasteride-Tamsulosin HCl (JALYN) 0.5-0.4 MG CAPS Take 1 capsule by mouth daily.      Marland Kitchen ezetimibe (ZETIA) 10 MG tablet TAKE 1 TABLET BY MOUTH DAILY (Patient  taking differently: Pt takes 5 mg) 90 tablet 0  . Multiple Vitamin (MULTIVITAMIN) capsule Take 1 capsule by mouth daily.      . Omega-3 1400 MG CAPS Take 2 capsules by mouth 2 (two) times daily.      No current facility-administered medications for this visit.    Socially, he is married for 28 years. He works in Psychologist, forensic. He did his undergraduate degree at [employer]. 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 There is no tobacco history. He does drink alcohol socially. He remains active with athletics.  Family History  Problem Relation Age of Onset  . Heart disease Unknown   . Hypertension Unknown   . Stroke Unknown   . Heart attack Father   . Hypertension Father   . Hyperlipidemia Father   . Heart attack Mother   . Hypertension Mother   . Hyperlipidemia Mother    ROS General: Negative; No fevers, chills, or night sweats; there is no change in weight HEENT: Negative; No changes in vision or hearing, sinus congestion, difficulty swallowing Pulmonary: He is status post recent allergy and treatment with Z-Pak.; No cough, wheezing, shortness of breath, hemoptysis Cardiovascular: Negative; No chest pain, presyncope, syncope, palpatations GI: Negative; No nausea, vomiting, diarrhea, or abdominal pain GU: Negative; No dysuria, hematuria, or difficulty voiding Musculoskeletal: An episode of low back discomfort.  Remote C6-7 anterior approach neck surgery over 20 years ago by Dr. Joya Salm Hematologic/Oncology: Negative; no easy bruising, bleeding Endocrine: Negative; no heat/cold intolerance; no diabetes Neuro: Negative; no changes in balance, headaches Skin: Negative; No rashes or skin lesions Psychiatric: Negative; No behavioral problems, depression Sleep: Negative; No snoring, daytime sleepiness, hypersomnolence, bruxism, restless legs, hypnogognic hallucinations, no cataplexy Other comprehensive 14 point system review is negative.   PE BP (!) 162/80    Pulse 68   Ht '5\' 10"'  (1.778 m)   Wt 181 lb 3.2 oz (82.2 kg)   BMI 26.00 kg/m    Repeat blood pressure was improved but still elevated at 148/82.  Wt Readings from Last 3 Encounters:  05/09/18 181 lb 3.2 oz (82.2 kg)  05/06/17 177 lb 6.4 oz (80.5 kg)  02/12/16 179 lb 12.8 oz (81.6 kg)   General: Alert, oriented, no distress.  Skin: normal turgor, no rashes, warm and dry HEENT: Normocephalic, atraumatic. Pupils equal round and reactive to light; sclera anicteric; extraocular muscles  intact;  Nose without nasal septal hypertrophy Mouth/Parynx benign; Mallinpatti scale 2 Neck: No JVD, no carotid bruits; normal carotid upstroke Lungs: clear to ausculatation and percussion; no wheezing or rales Chest wall: without tenderness to palpitation Heart: PMI not displaced, RRR, s1 s2 normal, 1/6 systolic murmur, unchanged; no diastolic murmur, no rubs, gallops, thrills, or heaves Abdomen: soft, nontender; no hepatosplenomehaly, BS+; abdominal aorta nontender and not dilated by palpation. Back: no CVA tenderness Pulses 2+ Musculoskeletal: full range of motion, normal strength, no joint deformities Extremities: no clubbing cyanosis or edema, Homan's sign negative  Neurologic: grossly nonfocal; Cranial nerves grossly wnl Psychologic: Normal mood and affect  ECG (independently read by me): Normal sinus rhythm at 68 bpm.  LVH by voltage criteria.  Normal intervals.  No ectopy.  November 2018 ECG (independently read by me): Normal sinus rhythm at 62 bpm.  No ectopy.  Normal intervals.  September 2017 ECG (independently read by me): Normal sinus rhythm at 64 bpm.  Borderline voltage criteria for LVH.   No significant ST changes.  Normal intervals.  September 2016 ECG (independently read by me): normal sinus rhythm at 61 bpm.  Normal intervals.  No ST segment changes.  February 2016 ECG (independently read by me): Normal sinus rhythm at 72 bpm.  No significant ST segment changes.  Normal  intervals.  May 2015 ECG (independently read by me) sinus bradycardia 57 beats per minute.  No ectopy.  Normal intervals.  ECG: Normal sinus rhythm at 60 with voltage criteria for LVH.  LABS: BMP Latest Ref Rng & Units 05/02/2018 03/04/2017 02/07/2016  Glucose 65 - 99 mg/dL 95 104(H) 95  BUN 8 - 27 mg/dL '12 12 11  ' Creatinine 0.76 - 1.27 mg/dL 1.02 0.95 0.95  BUN/Creat Ratio 10 - '24 12 13 ' -  Sodium 134 - 144 mmol/L 140 140 138  Potassium 3.5 - 5.2 mmol/L 4.1 4.1 4.2  Chloride 96 - 106 mmol/L 101 102 102  CO2 20 - 29 mmol/L '23 22 28  ' Calcium 8.6 - 10.2 mg/dL 9.6 9.5 9.5   Hepatic Function Latest Ref Rng & Units 05/02/2018 03/04/2017 02/07/2016  Total Protein 6.0 - 8.5 g/dL 7.0 7.1 7.1  Albumin 3.6 - 4.8 g/dL 4.7 4.7 4.6  AST 0 - 40 IU/L '29 31 26  ' ALT 0 - 44 IU/L 43 47(H) 35  Alk Phosphatase 39 - 117 IU/L 78 72 62  Total Bilirubin 0.0 - 1.2 mg/dL 1.2 1.0 1.5(H)   CBC Latest Ref Rng & Units 05/02/2018 03/04/2017 02/07/2016  WBC 3.4 - 10.8 x10E3/uL 6.2 6.9 6.4  Hemoglobin 13.0 - 17.7 g/dL 15.2 14.9 15.2  Hematocrit 37.5 - 51.0 % 45.1 41.2 44.1  Platelets 150 - 450 x10E3/uL 219 213 203   Lab Results  Component Value Date   MCV 90 05/02/2018   MCV 89 03/04/2017   MCV 93.0 02/07/2016   Lab Results  Component Value Date   TSH 1.610 05/02/2018    No results found for: HGBA1C   Lipid Panel     Component Value Date/Time   CHOL 164 05/02/2018 1019   CHOL 187 07/05/2014 1218   TRIG 120 05/02/2018 1019   TRIG 173 (H) 07/05/2014 1218   HDL 52 05/02/2018 1019   HDL 59 07/05/2014 1218   CHOLHDL 3.2 05/02/2018 1019   CHOLHDL 2.7 02/07/2016 1504   VLDL 36 (H) 02/07/2016 1504   LDLCALC 88 05/02/2018 1019   LDLCALC 93 07/05/2014 1218    IMPRESSION:  1. Essential hypertension  2. Coronary artery calcification seen on CT scan in 2008   3. Atherosclerosis of native coronary artery of native heart without angina pectoris   4. Mixed hyperlipidemia   5. Prostatitis, unspecified  prostatitis type     ASSESSMENT AND PLAN:  Mr. Gastelum has is a 62 year old active gentleman who has mild coronary calcification and nonobstructive stenoses demonstrated on a prior chest CT in 2008.  He has had normal myocardial perfusion studies and stress echo studies in the past. His last nuclear study in February 2016 was unchanged and showed normal perfusion without scar or ischemia.  .  Currently, he has been taking amlodipine/benazepril 5/10 , and atenolol 12.5 mg daily.  When I last saw him, his blood pressure was excellent.  Approximately 6 weeks ago, he had to state significant comfort to his low back which limited his exercise for almost 4 weeks duration.  During this period he has gained some weight.  Thanksgiving holidays just occurred and his diet was somewhat off compared to his normal regimented diet.  Particularly, last evening he had pizza with sausage and Canadian bacon.  His blood pressure today is elevated.  He feels confident that this is apparent and he states his blood pressure at home typically runs in the 114 to less than 130 range.  I have recommended that he monitor his blood pressure very closely.  He has been maintained on his prior regimen consisting of amlodipine/benazepril 5/10 combination.  He also has been on atenolol 25 mg daily.  If blood pressure consistently is above 1 35-1 40 range I have suggested further titration of benazepril to 20 mg.  He will monitor his blood pressure and contact me if his blood pressure continues to stay elevated for his medication adjustment.  He admits to some slight recent weight gain.  He has resumed his activity and he feels confident this will be somewhat reduced as he continues to exercise.  His ECG remained stable.  I again discussed optimal blood pressure as well as new hypertensive guidelines.  He continues to be on that he had 10 mg in addition to atorvastatin 80 mg.  Most recent lipid studies remain stable but LDL has increased from 66 up  to 88.  His LFTs are normal.  He is now on dutasteride and tamsulosin as single agents rather than the previous combined Jalyn for his prostate issues will contact me regarding his blood pressure monitoring.  As long as he is stable I will see him in 6 months for reevaluation.  Time spent: 25 minutes Troy Sine, MD, [facility] 05/09/2018 1:09 PM

## 2018-05-13 DIAGNOSIS — N401 Enlarged prostate with lower urinary tract symptoms: Secondary | ICD-10-CM | POA: Diagnosis not present

## 2018-05-13 DIAGNOSIS — R8271 Bacteriuria: Secondary | ICD-10-CM | POA: Diagnosis not present

## 2018-05-13 DIAGNOSIS — R351 Nocturia: Secondary | ICD-10-CM | POA: Diagnosis not present

## 2018-05-13 DIAGNOSIS — N410 Acute prostatitis: Secondary | ICD-10-CM | POA: Diagnosis not present

## 2018-05-20 ENCOUNTER — Other Ambulatory Visit: Payer: Self-pay | Admitting: Cardiovascular Disease

## 2018-05-31 ENCOUNTER — Other Ambulatory Visit: Payer: Self-pay | Admitting: Cardiovascular Disease

## 2018-05-31 DIAGNOSIS — E785 Hyperlipidemia, unspecified: Secondary | ICD-10-CM

## 2018-06-20 ENCOUNTER — Telehealth: Payer: Self-pay | Admitting: Cardiovascular Disease

## 2018-06-20 MED ORDER — AMLODIPINE BESY-BENAZEPRIL HCL 5-20 MG PO CAPS: 1.0000 | ORAL_CAPSULE | Freq: Every day | ORAL | 1 refills | Status: DC

## 2018-06-20 NOTE — Telephone Encounter (Signed)
Spoke with patient and he stated at his lost office visit his blood pressure was elevated but he had eaten pizza the night before. Since then he has decreased his salt and caffeine intake. Last week his blood pressure started to go up. Will forward to Pharm D and Dr Claiborne Billings for review

## 2018-06-20 NOTE — Telephone Encounter (Signed)
Advised patient, verbalized understanding, scheduled follow up with Pharm D

## 2018-06-20 NOTE — Telephone Encounter (Signed)
New message   Pt c/o BP issue: STAT if pt c/o blurred vision, one-sided weakness or slurred speech  1. What are your last 5 BP readings? 168/83 HR 62 11am  148/78 HR 56 sun. 8pm 130/80 HR 60 sat. 7:30am 177/99  fri 7am  167/88 wed 830          2. Are you having any other symptoms (ex. Dizziness, headache, blurred vision, passed out)? Feeling Pressure on his temples   3. What is your BP issue? Higher then usual BP ratings and pt is concerned. Please call

## 2018-06-20 NOTE — Telephone Encounter (Signed)
He is still on Lotrel at 5/10 recommend changing to 5/20 which will increase his benazepril from 10 mg to 20 mg.  Follow-up blood pressure

## 2018-07-05 ENCOUNTER — Ambulatory Visit (INDEPENDENT_AMBULATORY_CARE_PROVIDER_SITE_OTHER): Payer: 59 | Admitting: Pharmacist

## 2018-07-05 VITALS — BP 140/72 | HR 58 | Ht 70.0 in | Wt 179.0 lb

## 2018-07-05 DIAGNOSIS — I10 Essential (primary) hypertension: Secondary | ICD-10-CM | POA: Diagnosis not present

## 2018-07-05 DIAGNOSIS — E785 Hyperlipidemia, unspecified: Secondary | ICD-10-CM

## 2018-07-05 MED ORDER — EZETIMIBE 10 MG PO TABS: 10.0000 mg | ORAL_TABLET | Freq: Every day | ORAL | 0 refills | Status: DC

## 2018-07-05 NOTE — Patient Instructions (Addendum)
Return for a follow up appointment as needed (phone follow up in 2 weeks)  Check your blood pressure at home daily (if able) and keep record of the readings.  Take your BP meds as follows: *Take amlodipine-benazepril 5-20mg  daily* *Take atenolol 6.25mg  early evening*   Bring all of your meds, your BP cuff and your record of home blood pressures to your next appointment.  Exercise as you're able, try to walk approximately 30 minutes per day.  Keep salt intake to a minimum, especially watch canned and prepared boxed foods.  Eat more fresh fruits and vegetables and fewer canned items.  Avoid eating in fast food restaurants.    HOW TO TAKE YOUR BLOOD PRESSURE: . Rest 5 minutes before taking your blood pressure. .  Don't smoke or drink caffeinated beverages for at least 30 minutes before. . Take your blood pressure before (not after) you eat. . Sit comfortably with your back supported and both feet on the floor (don't cross your legs). . Elevate your arm to heart level on a table or a desk. . Use the proper sized cuff. It should fit smoothly and snugly around your bare upper arm. There should be enough room to slip a fingertip under the cuff. The bottom edge of the cuff should be 1 inch above the crease of the elbow. . Ideally, take 3 measurements at one sitting and record the average.

## 2018-07-05 NOTE — Progress Notes (Signed)
Patient ID: [name] [name]                 DOB: [birth year]                      MRN: [id number]     HPI: [name] [name] is a 63 y.o. male referred by Dr. Claiborne Billings  to HTN clinic.  PMH includes strong family history of heart disease, hypertension, and hyperlipidemia, calcium score of 194, and mild coronary obstructive disease. Patient admits to some diet indiscretions, recent weight gain, and less physical activity due to recent low back injury. January 8th temporal pressure 168/88 HR 60pbm, 123/72 after exercise and 168/80 on Monday.   Current HTN meds:  Amlodipine-benazepril 5-20mg  daily - started January/14  Atenolol 6.25mg  daily (1/4 tablet)  BP goal: 130/80  Family History: MI, hypertension, hyperlipidemia from mother and father  Social History: denies tobacco history, drinks socially  Diet: mainly home cooked meals, low sodium, and balanced  Exercise: works out with trainer 2 days per week, and 3-4 x/weeks on elliptical  Home BP readings:  7 readings; average 132/75; HR 54-64bpm  Wt Readings from Last 3 Encounters:  07/05/18 179 lb (81.2 kg)  05/09/18 181 lb 3.2 oz (82.2 kg)  05/06/17 177 lb 6.4 oz (80.5 kg)   BP Readings from Last 3 Encounters:  07/05/18 140/72  05/09/18 (!) 162/80  05/06/17 126/76   Pulse Readings from Last 3 Encounters:  07/05/18 (!) 58  05/09/18 68  05/06/17 62    Past Medical History:  Diagnosis Date  . Back problem   . Coronary artery disease (CAD) excluded    status post cardiac CT scan with only mild plaquing and no obstructive lesions 2006status post stress echo, ejection fraction 55% able to achieved 10  METS  . Dyslipidemia   . History of MRSA infection   . Hypertriglyceridemia     Current Outpatient Medications on File Prior to Visit  Medication Sig Dispense Refill  . amLODipine-benazepril (LOTREL) 5-20 MG capsule Take 1 capsule by mouth daily. 90 capsule 1  . aspirin 81 MG EC tablet Take 81 mg by mouth daily.      Marland Kitchen atenolol  (TENORMIN) 25 MG tablet Take 25 mg by mouth as directed. TAKE 1/4 TABLET DAILY    . atorvastatin (LIPITOR) 80 MG tablet TAKE 1 TABLET BY MOUTH EVERY DAY (Patient taking differently: Takes 40 mg) 90 tablet 3  . Coenzyme Q10 (COQ10 PO) Take by mouth.    . Dutasteride-Tamsulosin HCl (JALYN) 0.5-0.4 MG CAPS Take 1 capsule by mouth daily.      . Multiple Vitamin (MULTIVITAMIN) capsule Take 1 capsule by mouth daily.      . Omega-3 1400 MG CAPS Take 2 capsules by mouth 2 (two) times daily.      No current facility-administered medications on file prior to visit.     No Known Allergies  Blood pressure 140/72, pulse (!) 58, height 5\' 10"  (1.778 m), weight 179 lb (81.2 kg).  Essential hypertension BP remains above desired goal of 130/80, but significantly improved from prior readings. Patient is tolerating current therapy without any problems. After careful revision of current diet and supplement intake, we noticed higher caffeine and sodium intake in the last 1-2 months due to change in morning drinks.   Will change BB to evening dose, continue amlodipine-benazepril 5/20mg  every morning , and decrease sodium in diet. Patient will follow up with HTN clinic in 2 weeks by phone  to determine if additional medication titration is needed.    Skipper Dacosta Rodriguez-Guzman PharmD, BCPS, [place] [address] [place] [address] 07/06/2018 5:30 PM

## 2018-07-06 NOTE — Assessment & Plan Note (Addendum)
BP remains above desired goal of 130/80, but significantly improved from prior readings. Patient is tolerating current therapy without any problems. After careful revision of current diet and supplement intake, we noticed higher caffeine and sodium intake in the last 1-2 months due to change in morning drinks.   Will change BB to evening dose, continue amlodipine-benazepril 5/20mg  every morning , and decrease sodium in diet. Patient will follow up with HTN clinic in 2 weeks by phone to determine if additional medication titration is needed.

## 2018-07-13 DIAGNOSIS — N411 Chronic prostatitis: Secondary | ICD-10-CM | POA: Diagnosis not present

## 2018-07-13 DIAGNOSIS — R351 Nocturia: Secondary | ICD-10-CM | POA: Diagnosis not present

## 2018-07-21 ENCOUNTER — Telehealth: Payer: Self-pay | Admitting: Pharmacist

## 2018-07-21 NOTE — Telephone Encounter (Signed)
LMOM; Patient to call back to discuss BP readings after decreasing caffeine and sodium from diet.

## 2018-07-21 NOTE — Telephone Encounter (Signed)
-----   Message from [place], [facility] sent at 07/06/2018  5:29 PM EST ----- Regarding: HTN follow up BP readings? Need further medication titration?

## 2018-07-25 DIAGNOSIS — Z0000 Encounter for general adult medical examination without abnormal findings: Secondary | ICD-10-CM | POA: Diagnosis not present

## 2018-07-28 ENCOUNTER — Telehealth: Payer: Self-pay | Admitting: Pharmacist

## 2018-07-28 MED ORDER — AMLODIPINE BESY-BENAZEPRIL HCL 5-10 MG PO CAPS: 1.0000 | ORAL_CAPSULE | Freq: Every day | ORAL | 1 refills | Status: DC

## 2018-07-28 NOTE — Telephone Encounter (Signed)
Patient called to report better BP readings of 033-533 systolic every morning but also noted increased dizziness and lightheadedness .  Will:  1. Decrease dose back to Lotrel 5-10mg  daily but administration every evening instead of morning.  2. Monitor BP twice daily  3. Call back if BP back to 174-099 systolic to add amlodipine 5mg  every morning to regimen.

## 2018-08-03 DIAGNOSIS — Z23 Encounter for immunization: Secondary | ICD-10-CM | POA: Diagnosis not present

## 2018-08-19 ENCOUNTER — Other Ambulatory Visit: Payer: Self-pay | Admitting: Cardiovascular Disease

## 2018-08-19 DIAGNOSIS — E785 Hyperlipidemia, unspecified: Secondary | ICD-10-CM

## 2018-09-08 ENCOUNTER — Other Ambulatory Visit: Payer: Self-pay | Admitting: Cardiovascular Disease

## 2018-10-10 ENCOUNTER — Telehealth: Payer: Self-pay | Admitting: Pharmacist

## 2018-10-10 MED ORDER — AMLODIPINE BESYLATE 10 MG PO TABS: 10.0000 mg | ORAL_TABLET | Freq: Every day | ORAL | 1 refills | Status: DC

## 2018-10-10 NOTE — Telephone Encounter (Signed)
Patient called to report some dizziness and HA with positional changes.   He reports BP reading of 122/70 with HR at rest of 51bpm   Will try changing Lotel for amlodipine monotherapy and plan to follow up in 2 weeks with cardiologist.   Patient was encouraged to call back with questions or if new problems noted.

## 2018-10-18 ENCOUNTER — Telehealth: Payer: Self-pay | Admitting: Cardiovascular Disease

## 2018-10-18 NOTE — Telephone Encounter (Signed)
LVM for patient to let us know if he wants a phone or video visit with Dr. Claiborne Billings.

## 2018-10-24 ENCOUNTER — Telehealth: Payer: Self-pay | Admitting: Cardiovascular Disease

## 2018-10-24 NOTE — Telephone Encounter (Signed)
Lm to call back ./cy 

## 2018-10-24 NOTE — Telephone Encounter (Signed)
New message:   Patient calling trying to get a appt with the doctor. I do not see anything on his schedule. Please call patient.

## 2018-10-27 ENCOUNTER — Telehealth: Payer: 59 | Admitting: Cardiovascular Disease

## 2018-10-27 NOTE — Telephone Encounter (Signed)
Hey I see where this was cancelled by you,do we know why it was? Thank you!

## 2018-10-27 NOTE — Telephone Encounter (Signed)
Left message on both # home and cell- to call back -  If no response today will close encounter

## 2018-10-27 NOTE — Telephone Encounter (Signed)
Spoke with pt who state at last OV with Dr. Claiborne Billings he was instructed to follow up in 6 months for blood work, EKG, and BP check. Pt state he had a virtual visit scheduled for today 5/21 that somehow got cancelled. He is now requesting to reschedule appointment but inquiring if it need to be an in office visit or virtual. He report BP is better but still slightly elevated. Yesterday BP was 12/70 and this morning 149/78. Will route to MD for recommendations.

## 2018-10-27 NOTE — Telephone Encounter (Signed)
Patient returned your call.

## 2018-10-28 NOTE — Telephone Encounter (Signed)
Lets do 6/29- at 11:20 for appointment time. Use that spot for a virtual visit.  Since last appointment was cancelled.  Please call patient and notify, thank you!

## 2018-11-26 ENCOUNTER — Other Ambulatory Visit: Payer: Self-pay | Admitting: Cardiovascular Disease

## 2018-11-28 ENCOUNTER — Telehealth: Payer: Self-pay | Admitting: Cardiovascular Disease

## 2018-11-28 NOTE — Telephone Encounter (Signed)
Video visit/my chart/[phone]/pre reg complete/consent obtained-- ttf

## 2018-12-02 ENCOUNTER — Telehealth: Payer: Self-pay | Admitting: Cardiovascular Disease

## 2018-12-02 LAB — BASIC METABOLIC PANEL
BUN/Creatinine Ratio: 13 (ref 10–24)
BUN: 12 mg/dL (ref 8–27)
CO2: 23 mmol/L (ref 20–29)
Calcium: 9.4 mg/dL (ref 8.6–10.2)
Chloride: 103 mmol/L (ref 96–106)
Creatinine, Ser: 0.9 mg/dL (ref 0.76–1.27)
GFR calc Af Amer: 105 mL/min/{1.73_m2} (ref >59)
GFR calc non Af Amer: 91 mL/min/{1.73_m2} (ref >59)
Glucose: 101 mg/dL — ABNORMAL HIGH (ref 65–99)
Potassium: 4.4 mmol/L (ref 3.5–5.2)
Sodium: 140 mmol/L (ref 134–144)

## 2018-12-02 NOTE — Telephone Encounter (Signed)
New Message ° ° ° ° °Left message to confirm appt and answer COVID Questions  °

## 2018-12-04 ENCOUNTER — Other Ambulatory Visit: Payer: Self-pay | Admitting: Cardiovascular Disease

## 2018-12-05 ENCOUNTER — Other Ambulatory Visit: Payer: Self-pay

## 2018-12-05 ENCOUNTER — Ambulatory Visit (INDEPENDENT_AMBULATORY_CARE_PROVIDER_SITE_OTHER): Payer: 59 | Admitting: Cardiovascular Disease

## 2018-12-05 VITALS — BP 142/78 | HR 58 | Temp 98.4°F | Ht 70.0 in | Wt 178.4 lb

## 2018-12-05 DIAGNOSIS — I2510 Atherosclerotic heart disease of native coronary artery without angina pectoris: Secondary | ICD-10-CM

## 2018-12-05 DIAGNOSIS — E785 Hyperlipidemia, unspecified: Secondary | ICD-10-CM | POA: Diagnosis not present

## 2018-12-05 DIAGNOSIS — I10 Essential (primary) hypertension: Secondary | ICD-10-CM

## 2018-12-05 NOTE — Telephone Encounter (Signed)
Rx(s) sent to pharmacy electronically.  

## 2018-12-05 NOTE — Progress Notes (Signed)
Patient ID: [name] [name], male   DOB: 1956/01/14, 63 y.o.   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     [name] [name] in the past has been followed by [name] [name] and later [name] [name].  I first saw [name] [name] on 10/27/2012 when he presented to establish cardiology care with me and expressed his interest in aggressive preventive cardiology assessment. He has a strong family history for heart disease in both parents as well as grandparents. He has a history of hypertension and hyperlipidemia.  Remotely when followed by [name] [name] he had undergone several myocardial perfusion studies.  In 2006 and 2007 he also underwent several [facility] heart lab evaluations. In September 2006 his cholesterol was 188, LDL 90, HDL 40 but his triglycerides were 292. He is LDL III a + b. percent was significantly increased at 50.5. HDL2b  percent was low at 11. He has been on statin plus zetia for lipid lowering therapy. In 2008 he began to be followed by [name] [name]. A chest CT revealed a calcium score 194. He did have mild coronary obstructive disease with less than 50% calcific lesion noted in the mid LAD and less than 30% calcific disease in the proximal and mid RCA.  Subsequently, he has undergone an exercise stress echo study in November 2011 which was normal. The patient has remained asymptomatic and most of his studies were screening evaluations for CAD. Since [name] [name] has left his practice in [place], [name] [name] has selected me to pursue his aggressive preventive cardiology evaluation.  [name] [name] remains very physically active. He exercises regularly and works out with a trainer at least 2 days per week. This past year he skied both at [place], [place] and [place], [place] without cardiovascular problems. He also does a ladder protocol on the elliptical trainer he has a 7 handicap in golf. He was seen in the emergency room at  [facility] for some symptoms of chest sensation in October 2013.   When I saw him initially, I recommended a complete set of laboratory including an NMR lipoprotein of with lipid panel. In addition I also recommended that he undergo a cardiopulmonary met test to assess for not only macrovascular angina but potential microvascular ischemia or endothelial dysfunction findings. Cardiopulmonary met test was done on 11/21/2012. This revealed a slightly reduced peak maximum oxygen consumption 80% of predicted he did have a low peak stroke volume. His anaerobic threshold was in the normal range at 13.7 mL's per kilogram per minute. Abnormal pulmonary status. Laboratory revealed a fasting glucose of 105. LDL cholesterol is excellent at 61 with an LDL particle number 01/13/1996 however, his triglycerides were still elevated at 325 and increased large VLDL of 9.5 His LDL particles were 631. Had a normal HDL of 46 and normal HDL particle number 39.4. His insulin resistance score was increased at 57.  On 03/28/2013, laboratory revealed a fasting glucose of 102. Bilirubin was 1.3. He had normal SGOT and SGPT. LDL particle number was excellent at 824, LDL 63, triglycerides were markedly improved but still minimally elevated at 169. Large LDL was also mildly elevated at 6.4 as was small LDL particle number at 654. HDL cholesterol was 47. Insulin resistance score was somewhat improved but again still mildly elevated at 52.  Since I last saw him in May 2015, he has continued to be fairly active.  However, recently he admits  to experiencing 4-5 episodes of a chest pressure sensation.  This was not always precipitated by activity.  He had gone skiing and was able to ski but did note some sharp twinges of chest discomfort.  Because of his significant family history for premature coronary artery disease and with significant disease in all 4 grandparents, father and mother, he was concerned about these episodes of chest  pain and presents for evaluation.  A follow-up NMR lipoprotein on 07/05/2014 showed a total cholesterol of 187, triglycerides were mildly elevated at 173.  HDL cholesterol is excellent at 59 and LDL cholesterol was 93.  He had 566 small LDL particles in his LDL particle number was 1211.  Insulin resistance score was slightly elevated at 59, which had increased from 46 9 months previously.  In early 2016 he had complained of some very mild episodes of vague chest sensation.  Remotely, a chest CT revealed a calcium score 194 and he had mild coronary obstructive disease.  With his  chest pain development I recommended that he undergo an exercise Myoview study.  This was done on 07/24/2014 and revealed normal perfusion without scar or ischemia.  Post stress ejection fraction was 64%.  He uunderwent follow-up lipid panel on 10/30/2014.  This was improved with a total cholesterol 151, triglycerides 152, HDL 51, LDL 70.  When I saw him last year one year ago he was exercising anywhere from 5-7 days per week , typically does cardio alternating with weights.  He had markedly reduced his intake.  At least one meal per day as a salad with protein.  I last saw him in November 2018 at which time he stated that he was doing well and remained stable and active.   He denied any episodes of chest pressure or palpitations.  He stated that his blood pressure was stable on  amlodipine/benazepril 5/10 , and atenolol 25 mg daily. Laboratory showed a fasting glucose of 104.  His CBC was normal. Lipid studies were improved with a total cholesterol 146, triglycerides 97, HDL 61, VLDL 19, and LDL cholesterol 66.  He is on atorvastatin 40 mg and Zetia 10 mg in addition to omega-3 fatty acid.  TSH was normal and there was  minimal increase in ALT at 47.  He has significantly adjusted his diet which resulted in marked improvement in his previous significant triglyceride elevation.  In that evaluation I also discussed the reduce it trial  with the CPAP.  He also was on Jalyn for prostate issues.  I last saw him in December 2019. Over the prioryear, he had continued to do well; however,  he had developed a significant episode of low back discomfort and during this time he could not exercise for approximately 4 weeks.  Subsequently, he  resumed his working out with a trainer on Mondays and Thursdays and does cardiac work at least 3 days/week with an elliptical at home. He eats salads 4 days/week typically with fish for protein. He ate more over the Thanksgiving break including foods with more sodium.  He states his blood pressure typically has been running in the  115 to -128 range with diastolics 63-84.  I saw him, his blood pressure was elevated and the night prior to seeing him he had eaten pizza with sausage and Canadian bacon.  At that time, I again reviewed the new hypertensive guidelines and suggested further titration of his benazepril to 20 mg since he had been on amlodipine/benazepril combination of 5/10.  Lipid studies had  slightly increased with an LDL up to 88.  Since I saw him, he apparently was evaluated by Raquel her pharmacist in the office setting and on telephone.  His medications were adjusted and he now is on an increased dose of amlodipine at 10 mg and is no longer on ACE inhibition.  He continues to be on atenolol 25 mg tablet for which he takes a quarter of a tablet daily.  He is on atorvastatin at 40 mg and apparently has been cutting his Zetia 10 mg pill in half to 5 mg.  He continues to be on dutasteride and tamsulosin for prostate issues.  Recently, he has still been staying active.  He has working with a Clinical research associate 2 days/week at a trainers house since gyms are closed and on Tuesday Friday and Saturdays he does elliptical work.  He often plays golf.  He denies chest pain PND orthopnea or palpitations.  He presents for evaluation   Past Medical History:  Diagnosis Date   Back problem    Coronary artery disease (CAD)  excluded    status post cardiac CT scan with only mild plaquing and no obstructive lesions 2006status post stress echo, ejection fraction 55% able to achieved 10  METS   Dyslipidemia    History of MRSA infection    Hypertriglyceridemia    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   Left Lower Flank    Past Surgical History:  Procedure Laterality Date   BACK SURGERY      No Known Allergies  Current Outpatient Medications  Medication Sig Dispense Refill   aspirin 81 MG EC tablet Take 81 mg by mouth daily.       atenolol (TENORMIN) 25 MG tablet Take 25 mg by mouth as directed. TAKE 1/4 TABLET DAILY     atorvastatin (LIPITOR) 80 MG tablet TAKE 1 TABLET BY MOUTH EVERY DAY 90 tablet 1   Coenzyme Q10 (COQ10 PO) Take by mouth.     Dutasteride-Tamsulosin HCl (JALYN) 0.5-0.4 MG CAPS Take 1 capsule by mouth daily.       ezetimibe (ZETIA) 10 MG tablet TAKE 1 TABLET BY MOUTH EVERY DAY 90 tablet 1   Multiple Vitamin (MULTIVITAMIN) capsule Take 1 capsule by mouth daily.       Omega-3 1400 MG CAPS Take 2 capsules by mouth 2 (two) times daily.      amLODipine (NORVASC) 10 MG tablet Take 1 tablet (10 mg total) by mouth daily. 90 tablet 3   amLODipine-benazepril (LOTREL) 5-10 MG capsule Take 1 capsule by mouth daily.     aspirin EC 81 MG tablet Take 81 mg by mouth daily.     atorvastatin (LIPITOR) 40 MG tablet Take 40 mg by mouth daily.     No current facility-administered medications for this visit.    Socially, he is married for 28 years. He works in Psychologist, forensic. He did his undergraduate degree at [employer]. 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 There is no tobacco history. He does drink alcohol socially. He remains active with athletics.  Family History  Problem Relation Age of Onset   Heart disease Other    Hypertension Other    Stroke Other    Heart attack Father    Hypertension Father    Hyperlipidemia Father      Heart attack Mother    Hypertension Mother    Hyperlipidemia Mother    ROS General: Negative; No fevers, chills, or night sweats; there  is no change in weight HEENT: Negative; No changes in vision or hearing, sinus congestion, difficulty swallowing Pulmonary: He is status post recent allergy and treatment with Z-Pak.; No cough, wheezing, shortness of breath, hemoptysis Cardiovascular: Negative; No chest pain, presyncope, syncope, palpatations GI: Negative; No nausea, vomiting, diarrhea, or abdominal pain GU: Negative; No dysuria, hematuria, or difficulty voiding Musculoskeletal: An episode of low back discomfort.  Remote C6-7 anterior approach neck surgery over 20 years ago by Dr. Joya Salm Hematologic/Oncology: Negative; no easy bruising, bleeding Endocrine: Negative; no heat/cold intolerance; no diabetes Neuro: Negative; no changes in balance, headaches Skin: Negative; No rashes or skin lesions Psychiatric: Negative; No behavioral problems, depression Sleep: Negative; No snoring, daytime sleepiness, hypersomnolence, bruxism, restless legs, hypnogognic hallucinations, no cataplexy Other comprehensive 14 point system review is negative.   PE BP (!) 142/78    Pulse (!) 58    Temp 98.4 F (36.9 C)    Ht '5\' 10"'  (1.778 m)    Wt 178 lb 6.4 oz (80.9 kg)    SpO2 99%    BMI 25.60 kg/m    Repeat blood pressure by me 128/74  Wt Readings from Last 3 Encounters:  12/05/18 178 lb 6.4 oz (80.9 kg)  07/05/18 179 lb (81.2 kg)  05/09/18 181 lb 3.2 oz (82.2 kg)   General: Alert, oriented, no distress.  Skin: normal turgor, no rashes, warm and dry HEENT: Normocephalic, atraumatic. Pupils equal round and reactive to light; sclera anicteric; extraocular muscles intact; Fundi ** Nose without nasal septal hypertrophy Mouth/Parynx benign; Mallinpatti scale Neck: No JVD, no carotid bruits; normal carotid upstroke Lungs: clear to ausculatation and percussion; no wheezing or rales Chest wall: without  tenderness to palpitation Heart: PMI not displaced, RRR, s1 s2 normal, 1/6 systolic murmur, no diastolic murmur, no rubs, gallops, thrills, or heaves Abdomen: soft, nontender; no hepatosplenomehaly, BS+; abdominal aorta nontender and not dilated by palpation. Back: no CVA tenderness Pulses 2+ Musculoskeletal: full range of motion, normal strength, no joint deformities Extremities: no clubbing cyanosis or edema, Homan's sign negative  Neurologic: grossly nonfocal; Cranial nerves grossly wnl Psychologic: Normal mood and affect   ECG (independently read by me): Sinus bradycardia 58; Normal intervals.Early transition.  December 2019 ECG (independently read by me): Normal sinus rhythm at 68 bpm.  LVH by voltage criteria.  Normal intervals.  No ectopy.  November 2018 ECG (independently read by me): Normal sinus rhythm at 62 bpm.  No ectopy.  Normal intervals.  September 2017 ECG (independently read by me): Normal sinus rhythm at 64 bpm.  Borderline voltage criteria for LVH.   No significant ST changes.  Normal intervals.  September 2016 ECG (independently read by me): normal sinus rhythm at 61 bpm.  Normal intervals.  No ST segment changes.  February 2016 ECG (independently read by me): Normal sinus rhythm at 72 bpm.  No significant ST segment changes.  Normal intervals.  May 2015 ECG (independently read by me) sinus bradycardia 57 beats per minute.  No ectopy.  Normal intervals.  ECG: Normal sinus rhythm at 60 with voltage criteria for LVH.  LABS: BMP Latest Ref Rng & Units 12/02/2018 05/02/2018 03/04/2017  Glucose 65 - 99 mg/dL 101(H) 95 104(H)  BUN 8 - 27 mg/dL '12 12 12  ' Creatinine 0.76 - 1.27 mg/dL 0.90 1.02 0.95  BUN/Creat Ratio 10 - '24 13 12 13  ' Sodium 134 - 144 mmol/L 140 140 140  Potassium 3.5 - 5.2 mmol/L 4.4 4.1 4.1  Chloride 96 - 106 mmol/L 103 101  102  CO2 20 - 29 mmol/L '23 23 22  ' Calcium 8.6 - 10.2 mg/dL 9.4 9.6 9.5   Hepatic Function Latest Ref Rng & Units 05/02/2018  03/04/2017 02/07/2016  Total Protein 6.0 - 8.5 g/dL 7.0 7.1 7.1  Albumin 3.6 - 4.8 g/dL 4.7 4.7 4.6  AST 0 - 40 IU/L '29 31 26  ' ALT 0 - 44 IU/L 43 47(H) 35  Alk Phosphatase 39 - 117 IU/L 78 72 62  Total Bilirubin 0.0 - 1.2 mg/dL 1.2 1.0 1.5(H)   CBC Latest Ref Rng & Units 05/02/2018 03/04/2017 02/07/2016  WBC 3.4 - 10.8 x10E3/uL 6.2 6.9 6.4  Hemoglobin 13.0 - 17.7 g/dL 15.2 14.9 15.2  Hematocrit 37.5 - 51.0 % 45.1 41.2 44.1  Platelets 150 - 450 x10E3/uL 219 213 203   Lab Results  Component Value Date   MCV 90 05/02/2018   MCV 89 03/04/2017   MCV 93.0 02/07/2016   Lab Results  Component Value Date   TSH 1.610 05/02/2018    No results found for: HGBA1C   Lipid Panel     Component Value Date/Time   CHOL 164 05/02/2018 1019   CHOL 187 07/05/2014 1218   TRIG 120 05/02/2018 1019   TRIG 173 (H) 07/05/2014 1218   HDL 52 05/02/2018 1019   HDL 59 07/05/2014 1218   CHOLHDL 3.2 05/02/2018 1019   CHOLHDL 2.7 02/07/2016 1504   VLDL 36 (H) 02/07/2016 1504   LDLCALC 88 05/02/2018 1019   LDLCALC 93 07/05/2014 1218    IMPRESSION:  1. Essential hypertension   2. Coronary artery calcification seen on CT scan in 2008   3. Hyperlipidemia with target LDL less than 70     ASSESSMENT AND PLAN:  [name]. Labrada has is a 63 year old active gentleman who has mild coronary calcification and nonobstructive stenoses demonstrated on a prior chest CT in 2008.  He has had normal myocardial perfusion studies and stress echo studies in the past. His last nuclear study in February 2016 was unchanged and showed normal perfusion without scar or ischemia.  He has had prior blood pressure elevations on his previous dose of amlodipine/benazepril 5/10 and currently his blood pressure is improved on a regimen consisting of only amlodipine 10 mg daily in addition to an atenolol 6.25 mg daily.  His most recent lipid studies from 7 months ago showed a total cholesterol 164 triglycerides 120, HDL 52, LDL 88.  He had self  reduced his Zetia and I have recommended that he resume taking the entire 10 mg pill along with his atorvastatin which he currently takes one half of an 80 mg pill.  If LDL cannot get less than 80 with this regimen further titration of atorvastatin to 80 mg can be done.  He has also continue to take over-the-counter fish oil. I commended him on his continued exercise prescription.  Continues to take an 81 mg for his documented nonobstructive stenosis.  He continues to take [facility] for prostate issues.  I will see him in 6 months for reevaluation with follow-up laboratory prior to his office visit.  Time spent: 25 minutes Troy Sine, MD, [facility] 12/06/2018 6:12 PM

## 2018-12-05 NOTE — Patient Instructions (Signed)
Medication Instructions:  The current medical regimen is effective;  continue present plan and medications. If you need a refill on your cardiac medications before your next appointment, please call your pharmacy.   Lab work: CMET, LIPID in November If you have labs (blood work) drawn today and your tests are completely normal, you will receive your results only by: Marland Kitchen MyChart Message (if you have MyChart) OR . A paper copy in the mail If you have any lab test that is abnormal or we need to change your treatment, we will call you to review the results.  Follow-Up: At [facility], you and your health needs are our priority.  As part of our continuing mission to provide you with exceptional heart care, we have created designated Provider Care Teams.  These Care Teams include your primary Cardiologist (physician) and Advanced Practice Providers (APPs -  Physician Assistants and Nurse Practitioners) who all work together to provide you with the care you need, when you need it. You will need a follow up appointment in 6 months.  Please call our office 2 months in advance to schedule this appointment.  You may see Dr.Kelly or one of the following Advanced Practice Providers on your designated Care Team: Almyra Deforest, [place] . Fabian Sharp, PA-C

## 2018-12-06 ENCOUNTER — Encounter: Payer: Self-pay | Admitting: Cardiovascular Disease

## 2019-04-09 ENCOUNTER — Other Ambulatory Visit: Payer: Self-pay | Admitting: Cardiovascular Disease

## 2019-04-09 DIAGNOSIS — E785 Hyperlipidemia, unspecified: Secondary | ICD-10-CM

## 2019-04-20 ENCOUNTER — Telehealth: Payer: Self-pay | Admitting: Cardiovascular Disease

## 2019-04-20 DIAGNOSIS — N419 Inflammatory disease of prostate, unspecified: Secondary | ICD-10-CM

## 2019-04-20 DIAGNOSIS — I10 Essential (primary) hypertension: Secondary | ICD-10-CM

## 2019-04-20 DIAGNOSIS — Z79899 Other long term (current) drug therapy: Secondary | ICD-10-CM

## 2019-04-20 DIAGNOSIS — E785 Hyperlipidemia, unspecified: Secondary | ICD-10-CM

## 2019-04-20 NOTE — Telephone Encounter (Signed)
Yes it is fine to go ahead and order these so they can be checked at the same time.

## 2019-04-20 NOTE — Telephone Encounter (Signed)
Patient would like for CBC, PSA and TSH to be added to his lab orders as well, so he can get it all done at one time.

## 2019-04-21 NOTE — Telephone Encounter (Signed)
Additional labs ordered.   Patient notified via mychart  Lab orders mailed

## 2019-06-12 LAB — COMPREHENSIVE METABOLIC PANEL
ALT: 36 IU/L (ref 0–44)
AST: 25 IU/L (ref 0–40)
Albumin/Globulin Ratio: 2.2 (ref 1.2–2.2)
Albumin: 4.8 g/dL (ref 3.8–4.8)
Alkaline Phosphatase: 86 IU/L (ref 39–117)
BUN/Creatinine Ratio: 13 (ref 10–24)
BUN: 12 mg/dL (ref 8–27)
Bilirubin Total: 1.1 mg/dL (ref 0.0–1.2)
CO2: 24 mmol/L (ref 20–29)
Calcium: 9.2 mg/dL (ref 8.6–10.2)
Chloride: 102 mmol/L (ref 96–106)
Creatinine, Ser: 0.89 mg/dL (ref 0.76–1.27)
GFR calc Af Amer: 105 mL/min/{1.73_m2} (ref >59)
GFR calc non Af Amer: 91 mL/min/{1.73_m2} (ref >59)
Globulin, Total: 2.2 g/dL (ref 1.5–4.5)
Glucose: 97 mg/dL (ref 65–99)
Potassium: 4 mmol/L (ref 3.5–5.2)
Sodium: 141 mmol/L (ref 134–144)
Total Protein: 7 g/dL (ref 6.0–8.5)

## 2019-06-12 LAB — LIPID PANEL
Chol/HDL Ratio: 2.5 ratio (ref 0.0–5.0)
Cholesterol, Total: 134 mg/dL (ref 100–199)
HDL: 54 mg/dL (ref >39)
LDL Chol Calc (NIH): 63 mg/dL (ref 0–99)
Triglycerides: 91 mg/dL (ref 0–149)
VLDL Cholesterol Cal: 17 mg/dL (ref 5–40)

## 2019-06-12 LAB — TSH: TSH: 1.61 u[IU]/mL (ref 0.450–4.500)

## 2019-06-12 LAB — CBC
Hematocrit: 42 % (ref 37.5–51.0)
Hemoglobin: 15 g/dL (ref 13.0–17.7)
MCH: 32.5 pg (ref 26.6–33.0)
MCHC: 35.7 g/dL (ref 31.5–35.7)
MCV: 91 fL (ref 79–97)
Platelets: 224 10*3/uL (ref 150–450)
RBC: 4.62 x10E6/uL (ref 4.14–5.80)
RDW: 13.1 % (ref 11.6–15.4)
WBC: 7.1 10*3/uL (ref 3.4–10.8)

## 2019-06-12 LAB — PSA: Prostate Specific Ag, Serum: 0.7 ng/mL (ref 0.0–4.0)

## 2019-06-15 ENCOUNTER — Encounter: Payer: Self-pay | Admitting: Cardiovascular Disease

## 2019-06-15 ENCOUNTER — Ambulatory Visit: Payer: 59 | Admitting: Cardiovascular Disease

## 2019-06-15 ENCOUNTER — Other Ambulatory Visit: Payer: Self-pay

## 2019-06-15 VITALS — BP 134/79 | HR 62 | Temp 97.1°F | Ht 70.0 in | Wt 173.0 lb

## 2019-06-15 DIAGNOSIS — I2510 Atherosclerotic heart disease of native coronary artery without angina pectoris: Secondary | ICD-10-CM

## 2019-06-15 DIAGNOSIS — I10 Essential (primary) hypertension: Secondary | ICD-10-CM | POA: Diagnosis not present

## 2019-06-15 DIAGNOSIS — E785 Hyperlipidemia, unspecified: Secondary | ICD-10-CM | POA: Diagnosis not present

## 2019-06-15 NOTE — Patient Instructions (Signed)
Medication Instructions:  NO CHANGE *If you need a refill on your cardiac medications before your next appointment, please call your pharmacy*  Lab Work: If you have labs (blood work) drawn today and your tests are completely normal, you will receive your results only by: Marland Kitchen MyChart Message (if you have MyChart) OR . A paper copy in the mail If you have any lab test that is abnormal or we need to change your treatment, we will call you to review the results.  Follow-Up: At [facility], you and your health needs are our priority.  As part of our continuing mission to provide you with exceptional heart care, we have created designated Provider Care Teams.  These Care Teams include your primary Cardiologist (physician) and Advanced Practice Providers (APPs -  Physician Assistants and Nurse Practitioners) who all work together to provide you with the care you need, when you need it.  Your next appointment:   12 month(s)  The format for your next appointment:   Either In Person or Virtual  Provider:   You may see Shelva Majestic MD or one of the following Advanced Practice Providers on your designated Care Team:    Almyra Deforest, PA-C  Fabian Sharp, PA-C or   Roby Lofts, [place]

## 2019-06-15 NOTE — Progress Notes (Signed)
Patient ID: [name] [name], male   DOB: Jun 29, 1955, 64 y.o.   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     [name] [name] in the past has been followed by Dr. Dannielle Burn and later Dr. Mare Ferrari.  I first saw [name] [name] on 10/27/2012 when he presented to establish cardiology care with me and expressed his interest in aggressive preventive cardiology assessment. He has a strong family history for heart disease in both parents as well as grandparents. He has a history of hypertension and hyperlipidemia.  Remotely when followed by Dr. Mare Ferrari he had undergone several myocardial perfusion studies.  In 2006 and 2007 he also underwent several [facility] heart lab evaluations. In September 2006 his cholesterol was 188, LDL 90, HDL 40 but his triglycerides were 292. He is LDL III a + b. percent was significantly increased at 50.5. HDL2b  percent was low at 11. He has been on statin plus zetia for lipid lowering therapy. In 2008 he began to be followed by Dr. Lesle Reek. A chest CT revealed a calcium score 194. He did have mild coronary obstructive disease with less than 50% calcific lesion noted in the mid LAD and less than 30% calcific disease in the proximal and mid RCA.  Subsequently, he has undergone an exercise stress echo study in November 2011 which was normal. The patient has remained asymptomatic and most of his studies were screening evaluations for CAD. Since Dr. Lesle Reek has left his practice in [place], [name] [name] has selected me to pursue his aggressive preventive cardiology evaluation.  [name] [name] remains very physically active. He exercises regularly and works out with a trainer at least 2 days per week. This past year he skied both at [place], [place] and [place], [place] without cardiovascular problems. He also does a ladder protocol on the elliptical trainer he has a 7 handicap in golf. He was seen in the emergency room at  [facility] for some symptoms of chest sensation in October 2013.   When I saw him initially, I recommended a complete set of laboratory including an NMR lipoprotein of with lipid panel. In addition I also recommended that he undergo a cardiopulmonary met test to assess for not only macrovascular angina but potential microvascular ischemia or endothelial dysfunction findings. Cardiopulmonary met test was done on 11/21/2012. This revealed a slightly reduced peak maximum oxygen consumption 80% of predicted he did have a low peak stroke volume. His anaerobic threshold was in the normal range at 13.7 mL's per kilogram per minute. Abnormal pulmonary status. Laboratory revealed a fasting glucose of 105. LDL cholesterol is excellent at 61 with an LDL particle number 01/13/1996 however, his triglycerides were still elevated at 325 and increased large VLDL of 9.5 His LDL particles were 631. Had a normal HDL of 46 and normal HDL particle number 39.4. His insulin resistance score was increased at 57.  On 03/28/2013, laboratory revealed a fasting glucose of 102. Bilirubin was 1.3. He had normal SGOT and SGPT. LDL particle number was excellent at 824, LDL 63, triglycerides were markedly improved but still minimally elevated at 169. Large LDL was also mildly elevated at 6.4 as was small LDL particle number at 654. HDL cholesterol was 47. Insulin resistance score was somewhat improved but again still mildly elevated at 52.  Since I last saw him in May 2015, he has continued to be fairly active.  However, recently he  admits to experiencing 4-5 episodes of a chest pressure sensation.  This was not always precipitated by activity.  He had gone skiing and was able to ski but did note some sharp twinges of chest discomfort.  Because of his significant family history for premature coronary artery disease and with significant disease in all 4 grandparents, father and mother, he was concerned about these episodes of chest  pain and presents for evaluation.  A follow-up NMR lipoprotein on 07/05/2014 showed a total cholesterol of 187, triglycerides were mildly elevated at 173.  HDL cholesterol is excellent at 59 and LDL cholesterol was 93.  He had 566 small LDL particles in his LDL particle number was 1211.  Insulin resistance score was slightly elevated at 59, which had increased from 46 9 months previously.  In early 2016 he had complained of some very mild episodes of vague chest sensation.  Remotely, a chest CT revealed a calcium score 194 and he had mild coronary obstructive disease.  With his  chest pain development I recommended that he undergo an exercise Myoview study.  This was done on 07/24/2014 and revealed normal perfusion without scar or ischemia.  Post stress ejection fraction was 64%.  He uunderwent follow-up lipid panel on 10/30/2014.  This was improved with a total cholesterol 151, triglycerides 152, HDL 51, LDL 70.  When I saw him last year one year ago he was exercising anywhere from 5-7 days per week , typically does cardio alternating with weights.  He had markedly reduced his intake.  At least one meal per day as a salad with protein.  I last saw him in November 2018 at which time he stated that he was doing well and remained stable and active.   He denied any episodes of chest pressure or palpitations.  He stated that his blood pressure was stable on  amlodipine/benazepril 5/10 , and atenolol 25 mg daily. Laboratory showed a fasting glucose of 104.  His CBC was normal. Lipid studies were improved with a total cholesterol 146, triglycerides 97, HDL 61, VLDL 19, and LDL cholesterol 66.  He is on atorvastatin 40 mg and Zetia 10 mg in addition to omega-3 fatty acid.  TSH was normal and there was  minimal increase in ALT at 47.  He has significantly adjusted his diet which resulted in marked improvement in his previous significant triglyceride elevation.  In that evaluation I also discussed the reduce it trial  with the CPAP.  He also was on Jalyn for prostate issues.  I  saw him in December 2019. Over the prior year, he had continued to do well; however,  he had developed a significant episode of low back discomfort and during this time he could not exercise for approximately 4 weeks.  Subsequently, he  resumed his working out with a trainer on Mondays and Thursdays and does cardiac work at least 3 days/week with an elliptical at home. He eats salads 4 days/week typically with fish for protein. He ate more over the Thanksgiving break including foods with more sodium.  He states his blood pressure typically has been running in the  115 to -128 range with diastolics 00-17.  I saw him, his blood pressure was elevated and the night prior to seeing him he had eaten pizza with sausage and Canadian bacon.  At that time, I again reviewed the new hypertensive guidelines and suggested further titration of his benazepril to 20 mg since he had been on amlodipine/benazepril combination of 5/10.  Lipid  studies had slightly increased with an LDL up to 88.  Since I saw him, he apparently was evaluated by [name] [name] her pharmacist in the office setting and on telephone.  His medications were adjusted and he now is on an increased dose of amlodipine at 10 mg and is no longer on ACE inhibition.  He continues to be on atenolol 25 mg tablet for which he takes a quarter of a tablet daily.  He is on atorvastatin at 40 mg and apparently has been cutting his Zetia 10 mg pill in half to 5 mg.  He continues to be on dutasteride and tamsulosin for prostate issues.  I last saw him in June 2020.  At that time he was continuing to be active.  He was  working with a Clinical research associate 2 days/week at a trainers house since gyms are closed and on Tuesday Friday and Saturdays he does elliptical work.  He often plays golf.  He denied chest pain,PND, orthopnea or palpitations.    Since that time, he has been more cognizant of his diet and sodium content.  Presently,  he is on amlodipine 10 mg daily, atenolol 6.25 mg.  At his last office visit, he was breaking his Zetia in half and I recommended resuming 10 mg daily.  He continues to be on atorvastatin one half of an 80 mg tablet.  He continues to be on Jalyn for prostate issues.  He also takes over-the-counter omega-3 fatty acid.  He had undergone repeat laboratory on June 12, 2019 which was excellent.  Specifically, total cholesterol was improved at 134, LDL 63 triglycerides 91 HDL 54.  Chemistry CBC PSA and TSH were all normal.  He presents for evaluation.   Past Medical History:  Diagnosis Date  . Back problem   . Coronary artery disease (CAD) excluded    status post cardiac CT scan with only mild plaquing and no obstructive lesions 2006status post stress echo, ejection fraction 55% able to achieved 10  METS  . Dyslipidemia   . History of MRSA infection   . Hypertriglyceridemia   . 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   Left Lower Flank    Past Surgical History:  Procedure Laterality Date  . BACK SURGERY      No Known Allergies  Current Outpatient Medications  Medication Sig Dispense Refill  . amLODipine (NORVASC) 10 MG tablet TAKE 1 TABLET BY MOUTH AT BEDTIME 90 tablet 3  . aspirin 81 MG EC tablet Take 81 mg by mouth daily.      Marland Kitchen aspirin EC 81 MG tablet Take 81 mg by mouth daily.    Marland Kitchen atenolol (TENORMIN) 25 MG tablet TAKE 1/2 TABLET BY MOUTH EVERY DAY 15 tablet 5  . atorvastatin (LIPITOR) 80 MG tablet TAKE 1 TABLET BY MOUTH EVERY DAY 90 tablet 1  . Coenzyme Q10 (COQ10 PO) Take by mouth.    . Dutasteride-Tamsulosin HCl (JALYN) 0.5-0.4 MG CAPS Take 1 capsule by mouth daily.      Marland Kitchen ezetimibe (ZETIA) 10 MG tablet TAKE 1 TABLET BY MOUTH EVERY DAY 90 tablet 1  . Multiple Vitamin (MULTIVITAMIN) capsule Take 1 capsule by mouth daily.      . Omega-3 1400 MG CAPS Take 2 capsules by mouth 2 (two) times daily.      No current facility-administered medications for this visit.    Socially, he is married for 28 years. He works in Psychologist, forensic. He did his undergraduate degree at [employer]. graduate work  at [employer] and achieved a Restaurant manager, fast food in finance. There is no tobacco history. He does drink alcohol socially. He remains active with athletics.  Family History  Problem Relation Age of Onset  . Heart disease Other   . Hypertension Other   . Stroke Other   . Heart attack Father   . Hypertension Father   . Hyperlipidemia Father   . Heart attack Mother   . Hypertension Mother   . Hyperlipidemia Mother    ROS General: Negative; No fevers, chills, or night sweats; there is no change in weight HEENT: Negative; No changes in vision or hearing, sinus congestion, difficulty swallowing Pulmonary: He is status post recent allergy and treatment with Z-Pak.; No cough, wheezing, shortness of breath, hemoptysis Cardiovascular: Negative; No chest pain, presyncope, syncope, palpatations GI: Negative; No nausea, vomiting, diarrhea, or abdominal pain GU: Negative; No dysuria, hematuria, or difficulty voiding Musculoskeletal: An episode of low back discomfort.  Remote C6-7 anterior approach neck surgery over 20 years ago by Dr. Joya Salm Hematologic/Oncology: Negative; no easy bruising, bleeding Endocrine: Negative; no heat/cold intolerance; no diabetes Neuro: Negative; no changes in balance, headaches Skin: Negative; No rashes or skin lesions Psychiatric: Negative; No behavioral problems, depression Sleep: Negative; No snoring, daytime sleepiness, hypersomnolence, bruxism, restless legs, hypnogognic hallucinations, no cataplexy Other comprehensive 14 point system review is negative.   PE BP 134/79   Pulse 62   Temp (!) 97.1 F (36.2 C)   Ht '5\' 10"'  (1.778 m)   Wt 173 lb (78.5 kg)   SpO2 100%   BMI 24.82 kg/m    Repeat blood pressure by me was 118/70  Wt Readings from Last 3 Encounters:  06/15/19 173 lb (78.5 kg)  12/05/18 178 lb 6.4 oz (80.9 kg)  07/05/18  179 lb (81.2 kg)   General: Alert, oriented, no distress.  Skin: normal turgor, no rashes, warm and dry HEENT: Normocephalic, atraumatic. Pupils equal round and reactive to light; sclera anicteric; extraocular muscles intact;  Nose without nasal septal hypertrophy Mouth/Parynx benign; Mallinpatti scale 2 Neck: No JVD, no carotid bruits; normal carotid upstroke Lungs: clear to ausculatation and percussion; no wheezing or rales Chest wall: without tenderness to palpitation Heart: PMI not displaced, RRR, s1 s2 normal, 1/6 systolic murmur, no diastolic murmur, no rubs, gallops, thrills, or heaves Abdomen: soft, nontender; no hepatosplenomehaly, BS+; abdominal aorta nontender and not dilated by palpation. Back: no CVA tenderness Pulses 2+ Musculoskeletal: full range of motion, normal strength, no joint deformities Extremities: no clubbing cyanosis or edema, Homan's sign negative  Neurologic: grossly nonfocal; Cranial nerves grossly wnl Psychologic: Normal mood and affect   ECG (independently read by me): Sinus rhythm with an isolated PAC.  Early transition.  Normal intervals.  No ST segment changes  June  2020 ECG (independently read by me): Sinus bradycardia 58; Normal intervals.Early transition.  December 2019 ECG (independently read by me): Normal sinus rhythm at 68 bpm.  LVH by voltage criteria.  Normal intervals.  No ectopy.  November 2018 ECG (independently read by me): Normal sinus rhythm at 62 bpm.  No ectopy.  Normal intervals.  September 2017 ECG (independently read by me): Normal sinus rhythm at 64 bpm.  Borderline voltage criteria for LVH.   No significant ST changes.  Normal intervals.  September 2016 ECG (independently read by me): normal sinus rhythm at 61 bpm.  Normal intervals.  No ST segment changes.  February 2016 ECG (independently read by me): Normal sinus rhythm at 72 bpm.  No significant ST segment changes.  Normal intervals.  May 2015 ECG (independently read by  me) sinus bradycardia 57 beats per minute.  No ectopy.  Normal intervals.  ECG: Normal sinus rhythm at 60 with voltage criteria for LVH.  LABS: BMP Latest Ref Rng & Units 06/12/2019 12/02/2018 05/02/2018  Glucose 65 - 99 mg/dL 97 101(H) 95  BUN 8 - 27 mg/dL '12 12 12  ' Creatinine 0.76 - 1.27 mg/dL 0.89 0.90 1.02  BUN/Creat Ratio 10 - '24 13 13 12  ' Sodium 134 - 144 mmol/L 141 140 140  Potassium 3.5 - 5.2 mmol/L 4.0 4.4 4.1  Chloride 96 - 106 mmol/L 102 103 101  CO2 20 - 29 mmol/L '24 23 23  ' Calcium 8.6 - 10.2 mg/dL 9.2 9.4 9.6   Hepatic Function Latest Ref Rng & Units 06/12/2019 05/02/2018 03/04/2017  Total Protein 6.0 - 8.5 g/dL 7.0 7.0 7.1  Albumin 3.8 - 4.8 g/dL 4.8 4.7 4.7  AST 0 - 40 IU/L '25 29 31  ' ALT 0 - 44 IU/L 36 43 47(H)  Alk Phosphatase 39 - 117 IU/L 86 78 72  Total Bilirubin 0.0 - 1.2 mg/dL 1.1 1.2 1.0   CBC Latest Ref Rng & Units 06/12/2019 05/02/2018 03/04/2017  WBC 3.4 - 10.8 x10E3/uL 7.1 6.2 6.9  Hemoglobin 13.0 - 17.7 g/dL 15.0 15.2 14.9  Hematocrit 37.5 - 51.0 % 42.0 45.1 41.2  Platelets 150 - 450 x10E3/uL 224 219 213   Lab Results  Component Value Date   MCV 91 06/12/2019   MCV 90 05/02/2018   MCV 89 03/04/2017   Lab Results  Component Value Date   TSH 1.610 06/12/2019    No results found for: HGBA1C   Lipid Panel     Component Value Date/Time   CHOL 134 06/12/2019 1006   CHOL 187 07/05/2014 1218   TRIG 91 06/12/2019 1006   TRIG 173 (H) 07/05/2014 1218   HDL 54 06/12/2019 1006   HDL 59 07/05/2014 1218   CHOLHDL 2.5 06/12/2019 1006   CHOLHDL 2.7 02/07/2016 1504   VLDL 36 (H) 02/07/2016 1504   LDLCALC 63 06/12/2019 1006   LDLCALC 93 07/05/2014 1218    IMPRESSION:  1. Essential hypertension   2. Hyperlipidemia with target LDL less than 70   3. Coronary artery calcification seen on CT scan in 2008     ASSESSMENT AND PLAN:  [name]. Cubero has is a 64 year old active gentleman who was found to have mild coronary calcification and nonobstructive stenoses  demonstrated on a prior chest CT in 2008.  He has had normal myocardial perfusion studies and stress echo studies in the past. His last nuclear study in February 2016 was unchanged and showed normal perfusion without scar or ischemia.  He has had prior blood pressure elevations on his previous dose of amlodipine/benazepril 5/10 and currently his blood pressure is improved on a regimen consisting of only amlodipine 10 mg daily in addition to an atenolol 6.25 mg daily.  Last year, he had had several appointments with [place] our pharmacist who impacted in making him recognize some of the sodium content that he was having his foods.  He consequently made significant adjustments.  He exercises regularly at least 4 days/week.  Blood pressures at home have consistently been in the 1 62-8 25 range systolically.  Blood pressure today is excellent on his regimen consisting of amlodipine 10 mg and atenolol 6.25 mg daily.  Lipid studies are now excellent on atorvastatin 40 mg plus Zetia 10 mg.  Laboratory is excellent.  Take a baby aspirin.  He will continue current therapy.  He continues to take [facility] for prostate issues as long as he remains stable, I will see him in 1 year for reevaluation or sooner if problems arise.   Troy Sine, MD, [facility] 06/15/2019 8:43 AM

## 2019-06-19 ENCOUNTER — Other Ambulatory Visit (INDEPENDENT_AMBULATORY_CARE_PROVIDER_SITE_OTHER): Payer: 59

## 2019-06-19 DIAGNOSIS — Z79899 Other long term (current) drug therapy: Secondary | ICD-10-CM

## 2019-06-19 DIAGNOSIS — E785 Hyperlipidemia, unspecified: Secondary | ICD-10-CM

## 2019-06-19 DIAGNOSIS — I10 Essential (primary) hypertension: Secondary | ICD-10-CM

## 2019-06-19 DIAGNOSIS — I2510 Atherosclerotic heart disease of native coronary artery without angina pectoris: Secondary | ICD-10-CM | POA: Diagnosis not present

## 2019-06-19 DIAGNOSIS — Z0389 Encounter for observation for other suspected diseases and conditions ruled out: Secondary | ICD-10-CM

## 2019-07-10 ENCOUNTER — Telehealth: Payer: Self-pay | Admitting: Pharmacist

## 2019-07-10 DIAGNOSIS — R2689 Other abnormalities of gait and mobility: Secondary | ICD-10-CM

## 2019-07-10 DIAGNOSIS — R413 Other amnesia: Secondary | ICD-10-CM

## 2019-07-10 MED ORDER — AMLODIPINE BESY-BENAZEPRIL HCL 5-10 MG PO CAPS: 1.0000 | ORAL_CAPSULE | Freq: Every day | ORAL | 1 refills | Status: DC

## 2019-07-10 NOTE — Telephone Encounter (Signed)
Patient calling to reports elevated BP in 170-180s, headaches, balance issues, and memory issues. Will like referral form cardiologist to see neurologist.   Recommendation:  1. HOLD amlodipine 2. Decrease all sodium on diet 3. Resume Lotrel 5-10mg  daily  Will forward message to Dr Claiborne Billings for neurologist referral.

## 2019-07-10 NOTE — Telephone Encounter (Signed)
Routed to nurse covering MD in clinic 07/11/2019 to review with MD

## 2019-07-12 ENCOUNTER — Encounter: Payer: Self-pay | Admitting: Neurology

## 2019-07-12 NOTE — Addendum Note (Signed)
Addended by: Valeta Harms on: 07/12/2019 03:15 PM   Modules accepted: Orders

## 2019-07-12 NOTE — Telephone Encounter (Signed)
Returned call to patient.He stated [facility] cannot see him for 3 weeks.Stated he would like to see [facility] or [facility] sooner.Stated he is concerned about loss of memory loss,headaches,off balance.I spoke to [facility] they will need a referral faxed to them at fax # [phone].They will call patient with appointment.Advised I will fax message to Dr.Kelly's RN.

## 2019-07-12 NOTE — Telephone Encounter (Signed)
Referral ordered Notified patient via MyChart message

## 2019-07-12 NOTE — Telephone Encounter (Signed)
Okay for referral to neurology.

## 2019-07-12 NOTE — Telephone Encounter (Signed)
Referral sent to [facility].  

## 2019-07-12 NOTE — Telephone Encounter (Signed)
Patient calling with questions about his referral. He says the Neurology office told him they can't see him until 4 or 5 weeks and suggested he try [facility].

## 2019-07-12 NOTE — Addendum Note (Signed)
Addended by: Fidel Levy on: 07/12/2019 12:32 PM   Modules accepted: Orders

## 2019-07-12 NOTE — Telephone Encounter (Signed)
Spoke with patient to notify that the referral for Neurology has been sent to [facility]. Pt asked if we were going to send the referral to [facility], notified that we are going to try to stay within the [facility] system if possible but if need be we can find alternative options. Pt agreeable with no other questions or concerns at this time.

## 2019-07-28 NOTE — Progress Notes (Signed)
NEUROLOGY CONSULTATION NOTE  [name] [name] MRN: [id number] DOB: May 04, 1956  Referring provider: Shelva Majestic, MD Primary care provider: Donald Prose, MD  Reason for consult:  Memory deficits; balance problems  HISTORY OF PRESENT ILLNESS: [name] [name] is a 64 year old right-handed white male with CAD, HTN, dyslipidemia and history of superficial nodular basal cell carcinoma who presents for memory deficits and balance instability.  History supplemented by referring provider note.  In December 2019-January 2020, he developed a period of fluctuating blood pressure, dizziness, headache, unsteady gait, lasting 2 1/2 to 3 weeks.  He had changes in blood pressure medications and symptoms improved.  On 06/17/2019, he had the Shingles vaccine.  He didn't feel well afterwards.  In mid-January, he developed recurrence of symptoms but more severe.  He noted 3 or 4 days of some memory deficits such as difficulty recalling what he done the prior day.  He started waking up in the morning with a dull frontal headache lasting about 35 minutes frontal. 2-3 times a week  .  He began having recurrence of blood pressure spikes, reaching as high as 170s/60s-80s.  He also reported pressure in ears.  He also reports some loss of feelings in left hand and fingers (similar in past many years ago from C6-7 radiculopathy).  He feels more fatigued.  He sometimes takes a power nap in his chair.  One time, he woke up and found himself slumped over in the chair to the right. Blood pressure medications were increased.  Over last week, symptoms have improved but not subsided.  Still has some fluctuations in BP.  Sharp shooting pain from right occipital to front 10-15 seconds maybe once a day.    06/12/2019 LABS:  CBC with WBC 7.1, HGB 15, HCT 42, PLT 224; CMP with Na 141, K 4, Cl 102, CO2 24, glucose 97, BUN 12, Cr 0.89, GFR 91; t bili 1.1, ALP 86, AST 25, ALT 36; TSH 1.610  PAST MEDICAL HISTORY: Past Medical History:  Diagnosis  Date  . Back problem   . Coronary artery disease (CAD) excluded    status post cardiac CT scan with only mild plaquing and no obstructive lesions 2006status post stress echo, ejection fraction 55% able to achieved 10  METS  . Dyslipidemia   . History of MRSA infection   . Hypertriglyceridemia   . 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   Left Lower Flank    PAST SURGICAL HISTORY: Past Surgical History:  Procedure Laterality Date  . BACK SURGERY      MEDICATIONS: Current Outpatient Medications on File Prior to Visit  Medication Sig Dispense Refill  . amLODipine-benazepril (LOTREL) 5-10 MG capsule Take 1 capsule by mouth daily. 30 capsule 1  . aspirin 81 MG EC tablet Take 81 mg by mouth daily.      Marland Kitchen aspirin EC 81 MG tablet Take 81 mg by mouth daily.    Marland Kitchen atenolol (TENORMIN) 25 MG tablet TAKE 1/2 TABLET BY MOUTH EVERY DAY 15 tablet 5  . atorvastatin (LIPITOR) 80 MG tablet TAKE 1 TABLET BY MOUTH EVERY DAY 90 tablet 1  . Coenzyme Q10 (COQ10 PO) Take by mouth.    . dutasteride (AVODART) 0.5 MG capsule Take 0.5 mg by mouth daily.    . Dutasteride-Tamsulosin HCl (JALYN) 0.5-0.4 MG CAPS Take 1 capsule by mouth daily.      Marland Kitchen ezetimibe (ZETIA) 10 MG tablet TAKE 1 TABLET BY MOUTH EVERY DAY 90 tablet 1  . Multiple  Vitamin (MULTIVITAMIN) capsule Take 1 capsule by mouth daily.      . Omega-3 1400 MG CAPS Take 2 capsules by mouth 2 (two) times daily.     . tamsulosin (FLOMAX) 0.4 MG CAPS capsule Take 0.4 mg by mouth daily.     No current facility-administered medications on file prior to visit.    ALLERGIES: No Known Allergies  FAMILY HISTORY: Family History  Problem Relation Age of Onset  . Heart disease Other   . Hypertension Other   . Stroke Other   . Heart attack Father   . Hypertension Father   . Hyperlipidemia Father   . Heart attack Mother   . Hypertension Mother   . Hyperlipidemia Mother    SOCIAL HISTORY: Social History   Socioeconomic History  .  Marital status: Married    Spouse name: Margaretha Sheffield  . Number of children: Not on file  . Years of education: Not on file  . Highest education level: Not on file  Occupational History  . Not on file  Tobacco Use  . Smoking status: Never Smoker  . Smokeless tobacco: Never Used  Substance and Sexual Activity  . Alcohol use: Yes    Comment: daily  . Drug use: No  . Sexual activity: Not on file  Other Topics Concern  . Not on file  Social History Narrative   ** Merged History Encounter **       Social Determinants of Health   Financial Resource Strain:   . Difficulty of Paying Living Expenses: Not on file  Food Insecurity:   . Worried About Charity fundraiser in the Last Year: Not on file  . Ran Out of Food in the Last Year: Not on file  Transportation Needs:   . Lack of Transportation (Medical): Not on file  . Lack of Transportation (Non-Medical): Not on file  Physical Activity:   . Days of Exercise per Week: Not on file  . Minutes of Exercise per Session: Not on file  Stress:   . Feeling of Stress : Not on file  Social Connections:   . Frequency of Communication with Friends and Family: Not on file  . Frequency of Social Gatherings with Friends and Family: Not on file  . Attends Religious Services: Not on file  . Active Member of Clubs or Organizations: Not on file  . Attends Archivist Meetings: Not on file  . Marital Status: Not on file  Intimate Partner Violence:   . Fear of Current or Ex-Partner: Not on file  . Emotionally Abused: Not on file  . Physically Abused: Not on file  . Sexually Abused: Not on file    PHYSICAL EXAM: Blood pressure (!) 175/81, pulse 76, resp. rate 18, height 5\' 10"  (1.778 m), weight 178 lb (80.7 kg), SpO2 98 %. General: No acute distress.  Patient appears well-groomed.  Head:  Normocephalic/atraumatic Eyes:  fundi examined but not visualized Neck: supple, no paraspinal tenderness, full range of motion Back: No paraspinal  tenderness Heart: regular rate and rhythm Lungs: Clear to auscultation bilaterally. Vascular: No carotid bruits. Neurological Exam: Mental status: alert and oriented to person, place, and time, recent and remote memory intact, fund of knowledge intact, attention and concentration intact, speech fluent and not dysarthric, language intact. [employer] Mental Exam 07/31/2019  Weekday Correct 1  Current year 1  What state are we in? 1  Amount spent 0  Amount left 2  # of Animals 3  5 objects recall  5  Number series 2  Hour markers 0  Time correct 2  Placed X in triangle correctly 1  Largest Figure 1  Name of male 2  Date back to work 2  Type of work 2  State she lived in 2  Total score 27   Cranial nerves: CN I: not tested CN II: pupils equal, round and reactive to light, visual fields intact CN III, IV, VI:  full range of motion, no nystagmus, no ptosis CN V: facial sensation intact CN VII: upper and lower face symmetric CN VIII: hearing intact CN IX, X: gag intact, uvula midline CN XI: sternocleidomastoid and trapezius muscles intact CN XII: tongue midline Bulk & Tone: normal, no fasciculations. Motor:  5/5 throughout  Sensation:  Pinprick and vibration sensation intact. Deep Tendon Reflexes:  2+ throughout, toes downgoing.  Finger to nose testing:  Without dysmetria.  Heel to shin:  Without dysmetria.  Gait:  Normal station and stride.  Able to turn and tandem walk. Romberg negative.  IMPRESSION: 1.  Cognitive deficits,new onset headache over 50, dizziness, gait instability.  Unclear etiology such as TIA but requires workup  PLAN: 1.  MRI of brain and MRA of head and neck 2.  B12 3.  Follow up after testing.  Thank you for allowing me to take part in the care of this patient.  Metta Clines, DO  CC:  Shelva Majestic, MD  Donald Prose, MD

## 2019-07-31 ENCOUNTER — Encounter: Payer: Self-pay | Admitting: Neurology

## 2019-07-31 ENCOUNTER — Ambulatory Visit: Payer: 59 | Admitting: Neurology

## 2019-07-31 ENCOUNTER — Other Ambulatory Visit: Payer: Self-pay

## 2019-07-31 VITALS — BP 175/81 | HR 76 | Resp 18 | Ht 70.0 in | Wt 178.0 lb

## 2019-07-31 DIAGNOSIS — G459 Transient cerebral ischemic attack, unspecified: Secondary | ICD-10-CM | POA: Diagnosis not present

## 2019-07-31 DIAGNOSIS — R519 Headache, unspecified: Secondary | ICD-10-CM | POA: Diagnosis not present

## 2019-07-31 DIAGNOSIS — R413 Other amnesia: Secondary | ICD-10-CM | POA: Diagnosis not present

## 2019-07-31 NOTE — Patient Instructions (Addendum)
1.  MRI of brain without contrast 2.  MRA of head and neck 3.  B12 level 4.  Follow up after testing. Your provider has requested that you have labwork completed today. Please go to [facility] ([id number]) on the second floor of this building before leaving the office today. You do not need to check in. If you are not called within 15 minutes please check with the front desk.  We have sent a referral to [facility] for your MRI and MRA  they will call you directly to schedule your appointment. They are located at [address]. If you need to contact them directly please call [phone].

## 2019-08-22 ENCOUNTER — Other Ambulatory Visit: Payer: Self-pay

## 2019-08-22 ENCOUNTER — Other Ambulatory Visit (INDEPENDENT_AMBULATORY_CARE_PROVIDER_SITE_OTHER): Payer: 59

## 2019-08-22 ENCOUNTER — Telehealth: Payer: Self-pay | Admitting: Neurology

## 2019-08-22 DIAGNOSIS — R413 Other amnesia: Secondary | ICD-10-CM | POA: Diagnosis not present

## 2019-08-22 LAB — VITAMIN B12: Vitamin B-12: 537 pg/mL (ref 200–1100)

## 2019-08-22 NOTE — Telephone Encounter (Signed)
Patient came by the office to have lab work checked. He is scheduled to have his MRI on Friday 08/25/19. He wanted to know if he should move up his June appt to have MRI results or will he be called with results? Please Advise. Thank you

## 2019-08-22 NOTE — Telephone Encounter (Signed)
Please let him know, we will call after results are available, leave appt as is for now. thanks

## 2019-08-25 ENCOUNTER — Ambulatory Visit
Admission: RE | Admit: 2019-08-25 | Discharge: 2019-08-25 | Disposition: A | Discharge status: Home / Self Care | Payer: 59 | Source: Ambulatory Visit | Attending: Neurology | Admitting: Neurology

## 2019-08-25 ENCOUNTER — Other Ambulatory Visit: Payer: Self-pay

## 2019-08-25 ENCOUNTER — Other Ambulatory Visit: Payer: Self-pay | Admitting: Cardiovascular Disease

## 2019-08-25 DIAGNOSIS — G459 Transient cerebral ischemic attack, unspecified: Secondary | ICD-10-CM

## 2019-08-25 DIAGNOSIS — R519 Headache, unspecified: Secondary | ICD-10-CM

## 2019-08-25 DIAGNOSIS — R413 Other amnesia: Secondary | ICD-10-CM

## 2019-08-25 IMAGING — MR MR HEAD W/O CM
10 series · 48 of 48 positions shown · IV contrast (multihance)
Comparison: No pertinent prior studies available for comparison.

CLINICAL DATA: New onset of headaches after age 50. Memory
deficits. TIA (transient ischemic attack). Additional history
provided by technologist: Patient reports headaches and memory
issues for 1 year.

Creatinine was obtained on site at [HOSPITAL] at [HOSPITAL].
Results: Creatinine 0.8 mg/dL (GFR 85)
EXAM:
MR HEAD WITHOUT CONTRAST
MR CIRCLE OF WILLIS WITHOUT CONTRAST
MRA OF THE NECK WITHOUT AND WITH CONTRAST
TECHNIQUE: Multiplanar, multiecho pulse sequences of the brain, circle of
willis and surrounding structures were obtained without intravenous
contrast. Angiographic images of the neck were obtained using MRA
technique without and with intravenous contrast.
CONTRAST:  15mL MULTIHANCE GADOBENATE DIMEGLUMINE 529 MG/ML IV SOLN

[Series 2: T1 · sagittal · 5.0mm · 0.45mm/px · 3 of 21 slices shown]
[im 1/21]
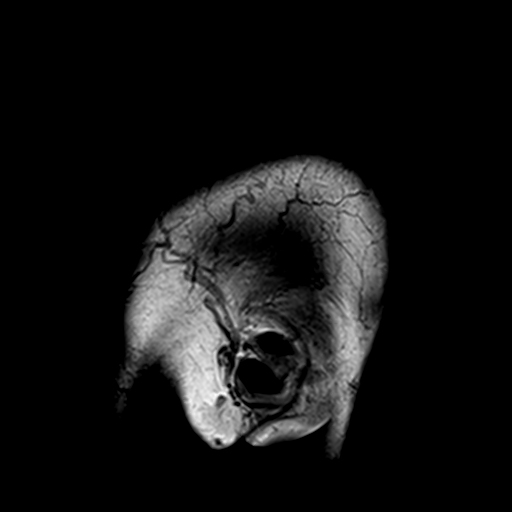
[im 11/21]
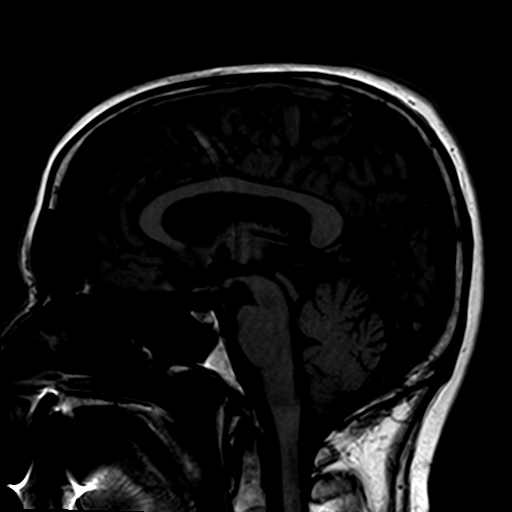
[im 21/21]
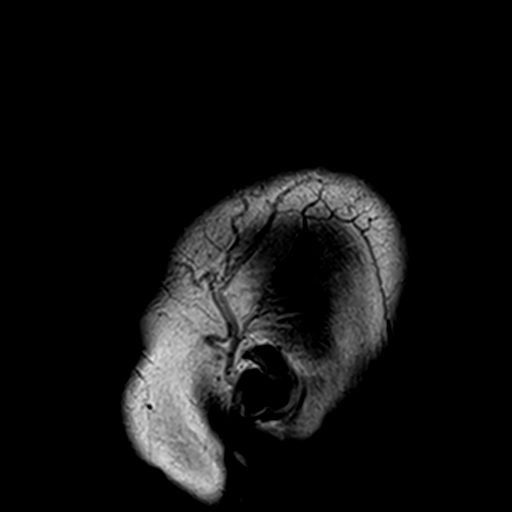

[Series 3: DWI · axial · 3.0mm · 1.80mm/px · z∈[-69,+78]mm · 9 of 100 slices shown (1 of 4)]
[im 1/100]
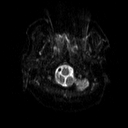
[im 13/100]
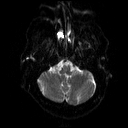
[im 25/100]
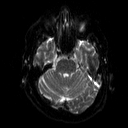
[im 38/100]
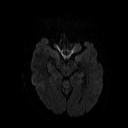
[im 50/100]
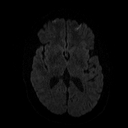
[im 62/100]
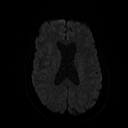
[im 75/100]
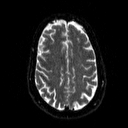
[im 87/100]
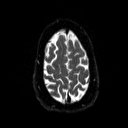
[im 100/100]
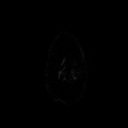

[Series 4: DWI · axial · 3.0mm · 1.80mm/px · z∈[-69,+78]mm · 4 of 47 slices shown (2 of 4)]
[im 1/47]
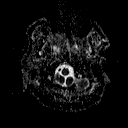
[im 16/47]
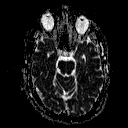
[im 31/47]
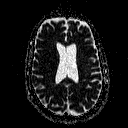
[im 47/47]
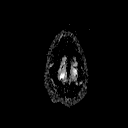

[Series 5: DWI · coronal · 5.0mm · 1.80mm/px · 6 of 65 slices shown (3 of 4)]
[im 1/65]
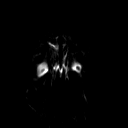
[im 13/65]
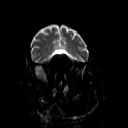
[im 26/65]
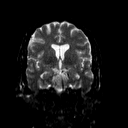
[im 39/65]
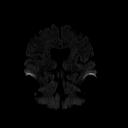
[im 52/65]
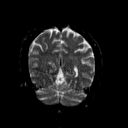
[im 65/65]
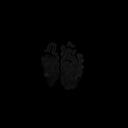

[Series 6: DWI · coronal · 5.0mm · 1.80mm/px · 3 of 34 slices shown (4 of 4)]
[im 1/34]
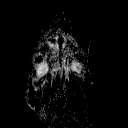
[im 17/34]
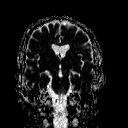
[im 34/34]
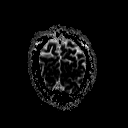

[Series 7: T2 · axial · 5.0mm · 0.51mm/px · z∈[-65,+82]mm · 2 of 22 slices shown (1 of 2)]
[im 1/22]
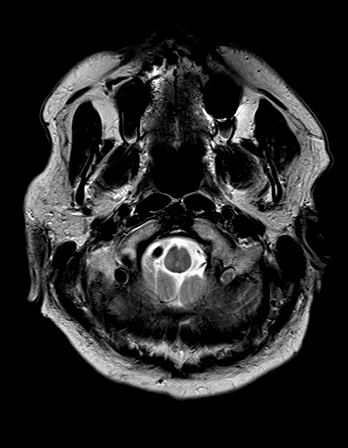
[im 22/22]
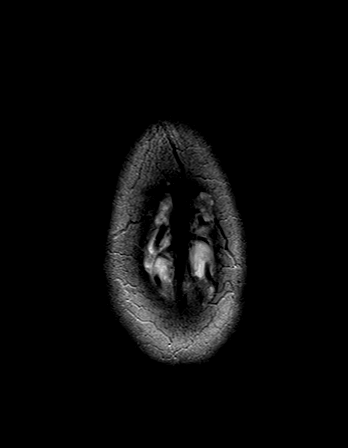

[Series 8: FLAIR · axial · 3.0mm · 0.45mm/px · z∈[-59,+76]mm · 3 of 30 slices shown]
[im 1/30]
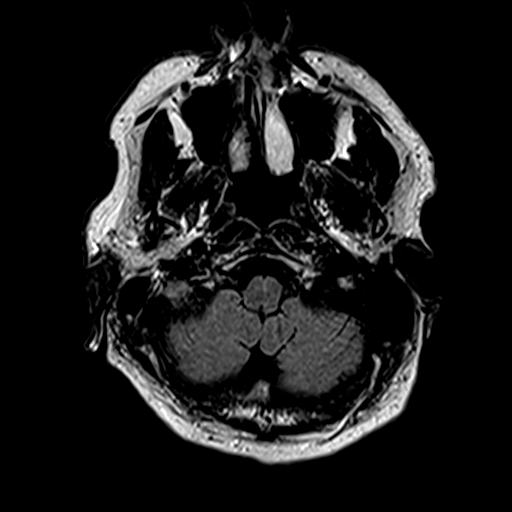
[im 15/30]
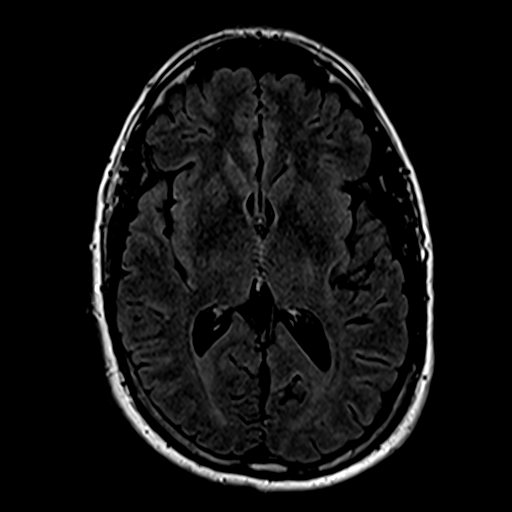
[im 30/30]
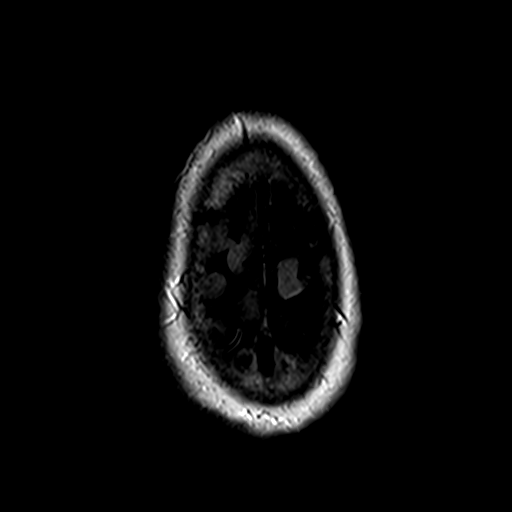

[Series 10: swi_images · axial · 4.0mm · 0.90mm/px · z∈[-62,+78]mm · 3 of 36 slices shown]
[im 1/36]
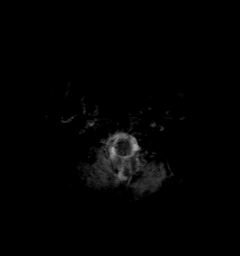
[im 18/36]
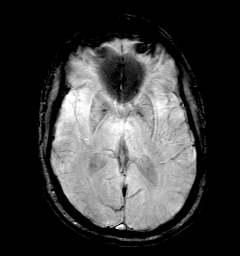
[im 36/36]
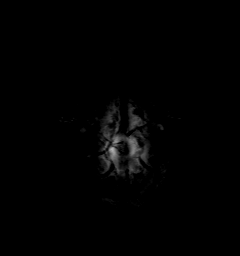

[Series 11: t1_mpr_tra · axial · 1.0mm · 0.71mm/px · z∈[-63,+80]mm · 13 of 144 slices shown]
[im 1/144]
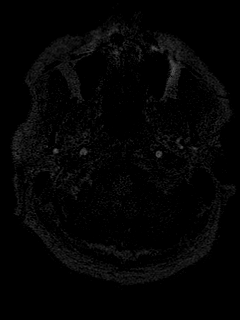
[im 12/144]
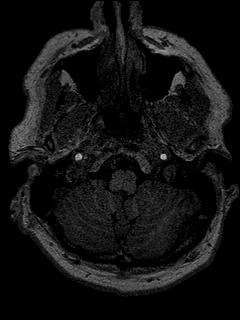
[im 24/144]
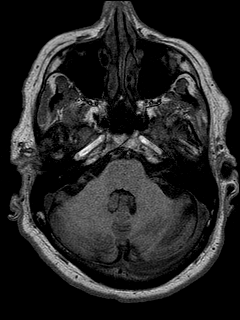
[im 36/144]
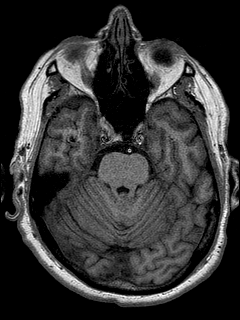
[im 48/144]
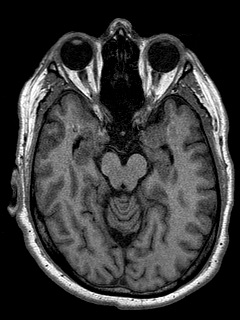
[im 60/144]
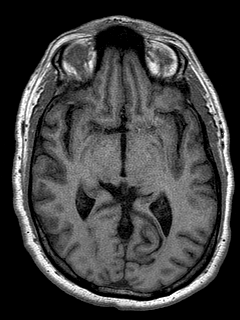
[im 72/144]
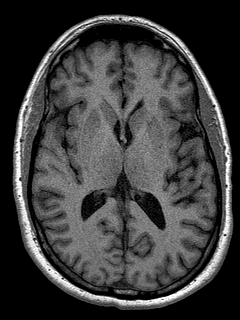
[im 84/144]
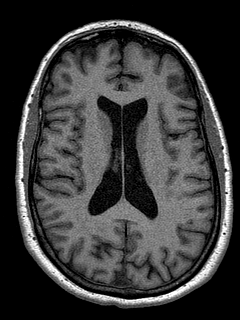
[im 96/144]
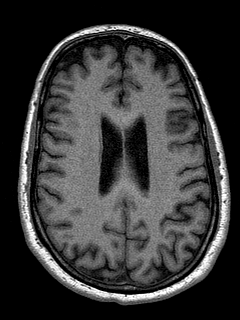
[im 108/144]
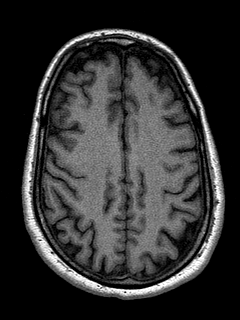
[im 120/144]
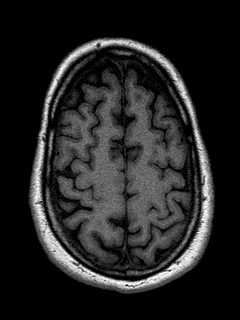
[im 132/144]
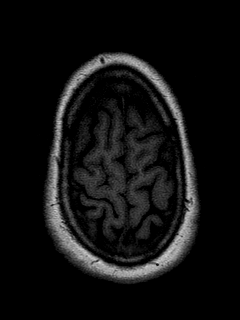
[im 144/144]
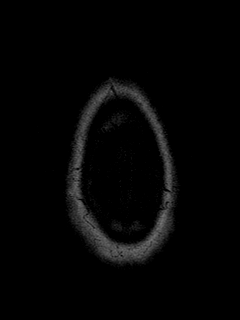

[Series 12: T2 · coronal · 5.0mm · 0.45mm/px · 2 of 25 slices shown (2 of 2)]
[im 1/25]
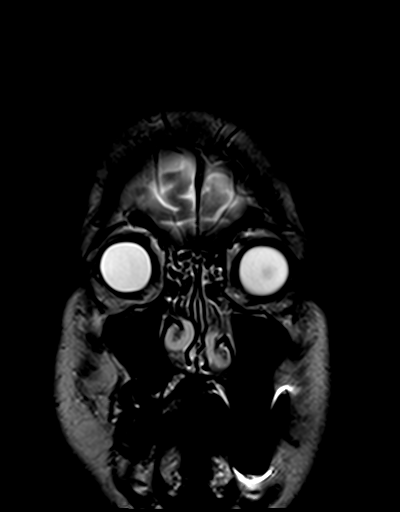
[im 25/25]
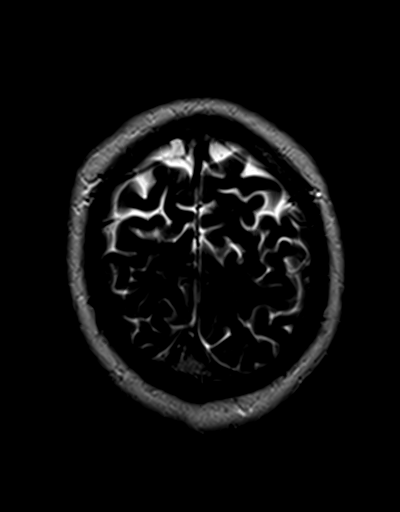

[48 of 48 positions shown; findings below may reference images not displayed]

FINDINGS: MR HEAD FINDINGS

There is no evidence of acute infarct.

No evidence of intracranial mass.

No midline shift or extra-axial fluid collection.

No chronic intracranial blood products.

No focal parenchymal signal abnormality is identified.

Cerebral volume is normal for age.

Vascular: Reported below.

Skull and upper cervical spine: No focal marrow lesion.

Sinuses/Orbits: Visualized orbits demonstrate no acute abnormality.
Minimal ethmoid sinus mucosal thickening. No significant mastoid
effusion.

MRA HEAD FINDINGS:

The intracranial internal carotid arteries are patent without
significant stenosis. The M1 middle cerebral arteries are patent
without significant stenosis. No M2 proximal branch occlusion or
high-grade proximal stenosis is identified. The anterior cerebral
arteries are patent without significant proximal stenosis. 1-2 mm
infundibulum versus tiny aneurysm arising from the A1 right anterior
cerebral artery (series 2, image 81) (series 9, image 12).

The visualized intracranial vertebral arteries are patent without
significant stenosis. The intracranial left vertebral artery is
developmentally diminutive and predominantly terminates as the left
PICA. The dominant intracranial right vertebral artery is patent
without stenosis, as is the basilar artery. The bilateral posterior
cerebral arteries are patent without significant proximal stenosis.

MRA NECK FINDINGS

Standard aortic branching. The visualized aortic arch is
unremarkable. No significant innominate or proximal subclavian
artery stenosis. The bilateral common and internal carotid arteries
are patent without stenosis. The right vertebral artery is
significantly dominant and patent throughout the neck without
stenosis. The non dominant left vertebral artery is patent
throughout the neck. There is moderate atherosclerotic narrowing at
the origin of this vessel
IMPRESSION: MRI brain:

1. Unremarkable MRI appearance of the brain for age. No evidence of
acute intracranial abnormality.
2. Minimal ethmoid sinus mucosal thickening.

MRA head:

1. No intracranial large vessel occlusion or proximal high-grade
arterial stenosis.
2. 1-2 mm infundibulum versus tiny aneurysm arising from the A1
right anterior cerebral artery.

MRA neck:

1. The common and internal carotid arteries are patent within the
neck without stenosis.
2. Moderate atherosclerotic narrowing at the origin of the non
dominant left vertebral artery.
3. The dominant right vertebral artery is patent throughout the neck
without stenosis.

## 2019-08-25 IMAGING — MR MR MRA NECK WO/W CM
2 of 3 series · 24 of 48 positions shown · IV contrast (15 ml multihance)
Comparison: No pertinent prior studies available for comparison.

CLINICAL DATA: New onset of headaches after age 50. Memory
deficits. TIA (transient ischemic attack). Additional history
provided by technologist: Patient reports headaches and memory
issues for 1 year.

Creatinine was obtained on site at [HOSPITAL] at [HOSPITAL].
Results: Creatinine 0.8 mg/dL (GFR 85)
EXAM:
MR HEAD WITHOUT CONTRAST
MR CIRCLE OF WILLIS WITHOUT CONTRAST
MRA OF THE NECK WITHOUT AND WITH CONTRAST
TECHNIQUE: Multiplanar, multiecho pulse sequences of the brain, circle of
willis and surrounding structures were obtained without intravenous
contrast. Angiographic images of the neck were obtained using MRA
technique without and with intravenous contrast.
CONTRAST:  15mL MULTIHANCE GADOBENATE DIMEGLUMINE 529 MG/ML IV SOLN

[Series 4: fl_tof_2d · axial · 3.0mm · 0.39mm/px · z∈[-179,-89]mm · 13 of 50 slices shown]
[im 1/50]
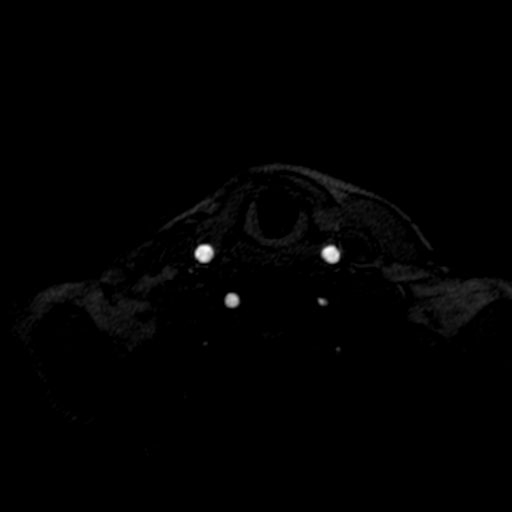
[im 4/50]
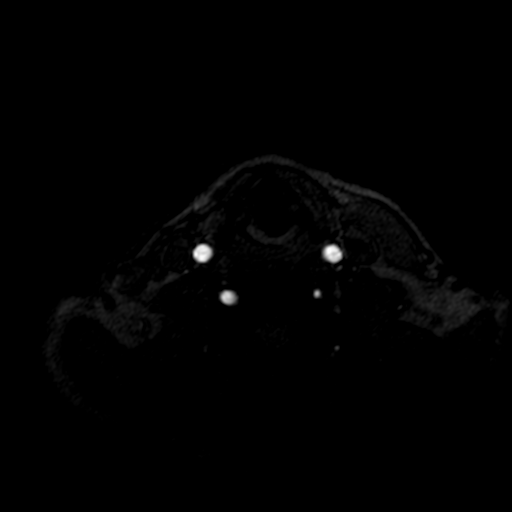
[im 7/50]
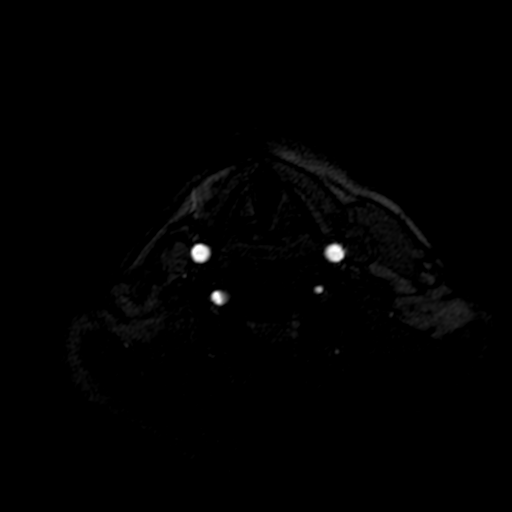
[im 10/50]
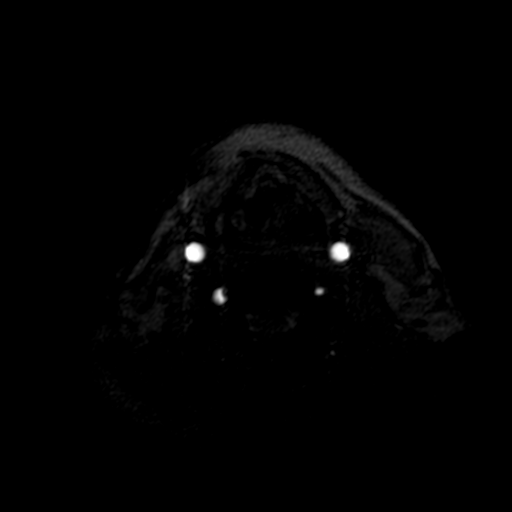
[im 13/50]
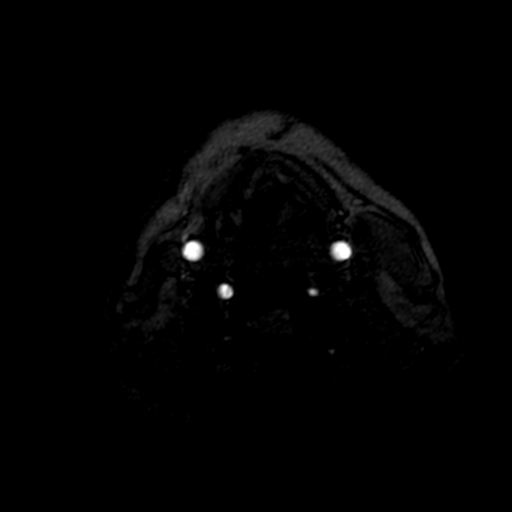
[im 16/50]
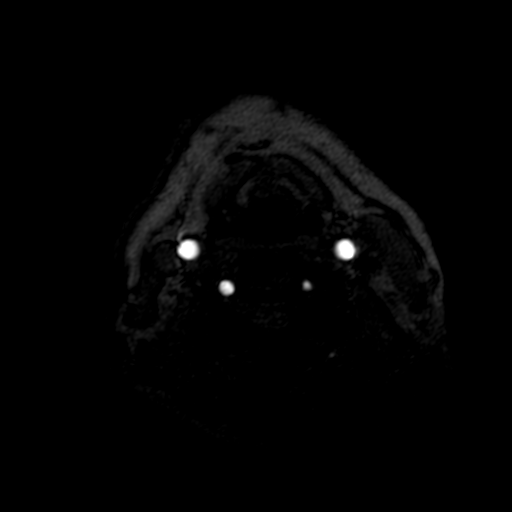
[im 22/50]
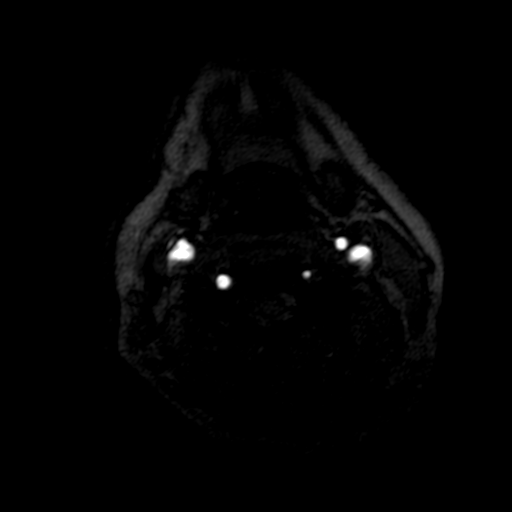
[im 25/50]
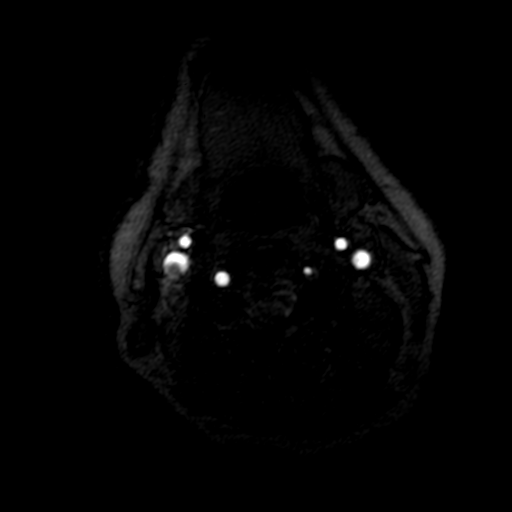
[im 28/50]
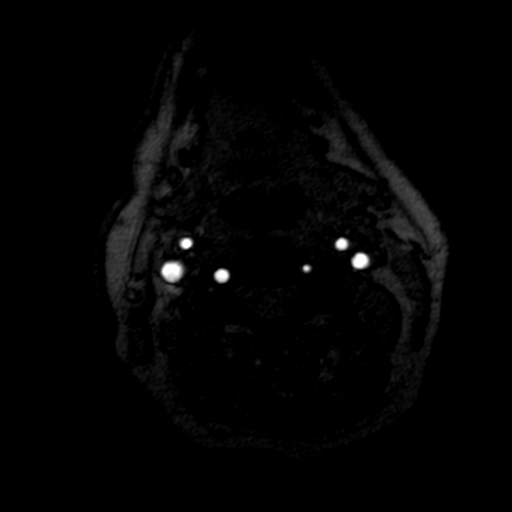
[im 34/50]
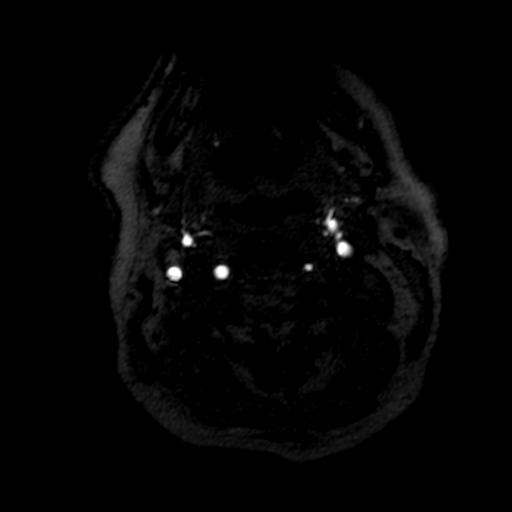
[im 40/50]
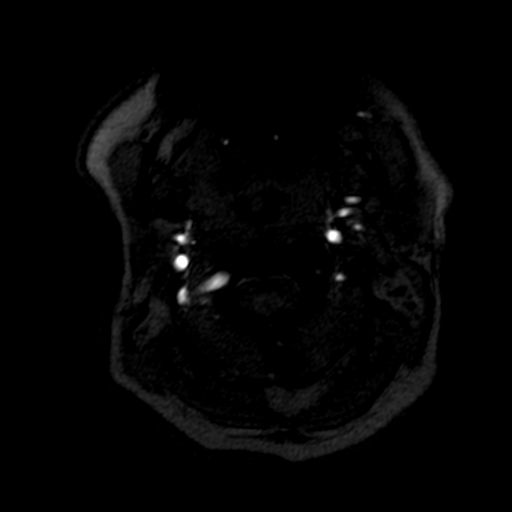
[im 43/50]
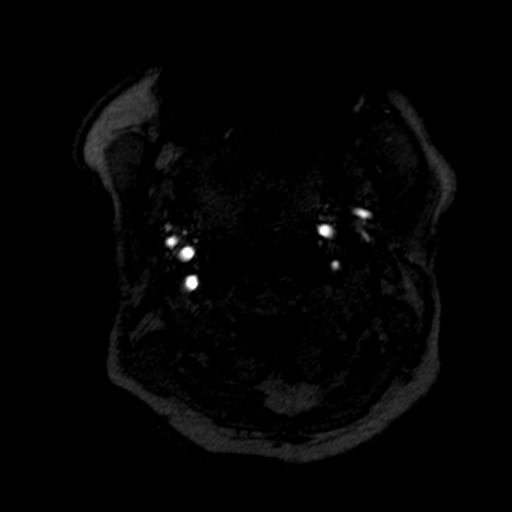
[im 46/50]
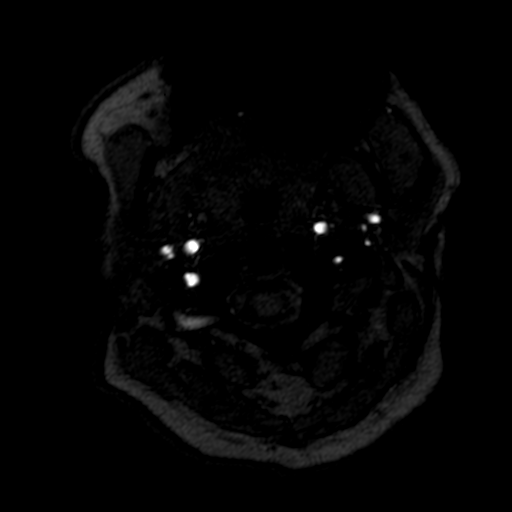

[Series 10: (id)_tt=1.0s · coronal · 0.8mm · 0.78mm/px · 11 of 65 slices shown]
[im 3/65]
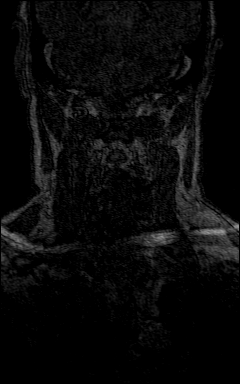
[im 9/65]
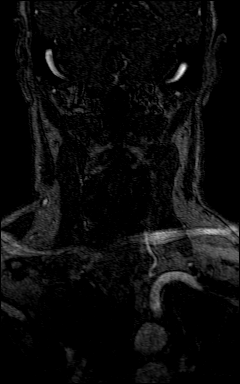
[im 12/65]
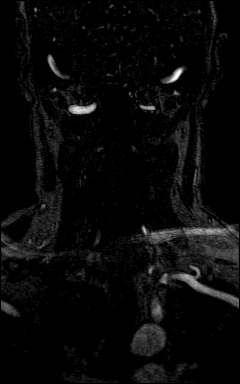
[im 21/65]
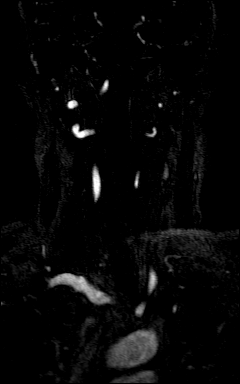
[im 30/65]
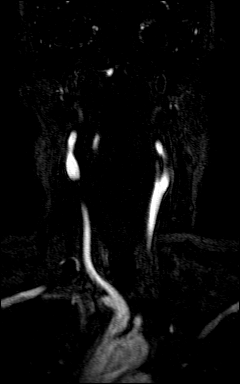
[im 33/65]
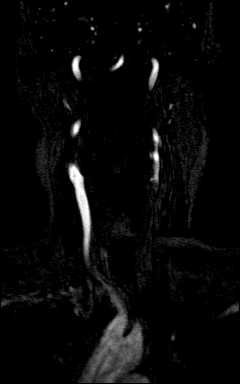
[im 35/65]
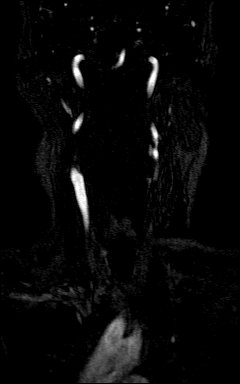
[im 44/65]
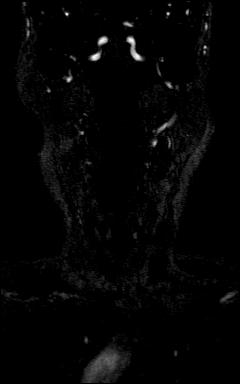
[im 53/65]
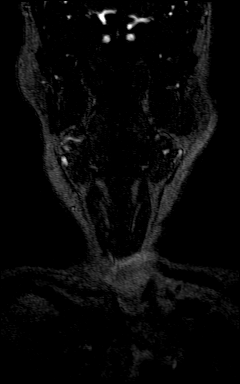
[im 56/65]
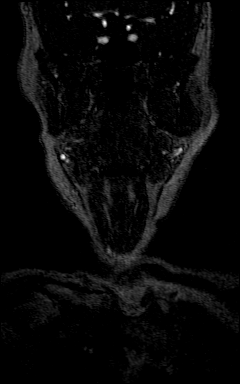
[im 62/65]
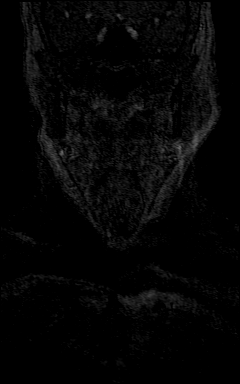

[24 of 48 positions shown; findings below may reference images not displayed]

FINDINGS: MR HEAD FINDINGS

There is no evidence of acute infarct.

No evidence of intracranial mass.

No midline shift or extra-axial fluid collection.

No chronic intracranial blood products.

No focal parenchymal signal abnormality is identified.

Cerebral volume is normal for age.

Vascular: Reported below.

Skull and upper cervical spine: No focal marrow lesion.

Sinuses/Orbits: Visualized orbits demonstrate no acute abnormality.
Minimal ethmoid sinus mucosal thickening. No significant mastoid
effusion.

MRA HEAD FINDINGS:

The intracranial internal carotid arteries are patent without
significant stenosis. The M1 middle cerebral arteries are patent
without significant stenosis. No M2 proximal branch occlusion or
high-grade proximal stenosis is identified. The anterior cerebral
arteries are patent without significant proximal stenosis. 1-2 mm
infundibulum versus tiny aneurysm arising from the A1 right anterior
cerebral artery (series 2, image 81) (series 9, image 12).

The visualized intracranial vertebral arteries are patent without
significant stenosis. The intracranial left vertebral artery is
developmentally diminutive and predominantly terminates as the left
PICA. The dominant intracranial right vertebral artery is patent
without stenosis, as is the basilar artery. The bilateral posterior
cerebral arteries are patent without significant proximal stenosis.

MRA NECK FINDINGS

Standard aortic branching. The visualized aortic arch is
unremarkable. No significant innominate or proximal subclavian
artery stenosis. The bilateral common and internal carotid arteries
are patent without stenosis. The right vertebral artery is
significantly dominant and patent throughout the neck without
stenosis. The non dominant left vertebral artery is patent
throughout the neck. There is moderate atherosclerotic narrowing at
the origin of this vessel
IMPRESSION: MRI brain:

1. Unremarkable MRI appearance of the brain for age. No evidence of
acute intracranial abnormality.
2. Minimal ethmoid sinus mucosal thickening.

MRA head:

1. No intracranial large vessel occlusion or proximal high-grade
arterial stenosis.
2. 1-2 mm infundibulum versus tiny aneurysm arising from the A1
right anterior cerebral artery.

MRA neck:

1. The common and internal carotid arteries are patent within the
neck without stenosis.
2. Moderate atherosclerotic narrowing at the origin of the non
dominant left vertebral artery.
3. The dominant right vertebral artery is patent throughout the neck
without stenosis.

## 2019-08-25 IMAGING — MR MR MRA HEAD W/O CM
1 series · 21 of 48 positions shown · IV contrast (multihance)
Comparison: No pertinent prior studies available for comparison.

CLINICAL DATA: New onset of headaches after age 50. Memory
deficits. TIA (transient ischemic attack). Additional history
provided by technologist: Patient reports headaches and memory
issues for 1 year.

Creatinine was obtained on site at [HOSPITAL] at [HOSPITAL].
Results: Creatinine 0.8 mg/dL (GFR 85)
EXAM:
MR HEAD WITHOUT CONTRAST
MR CIRCLE OF WILLIS WITHOUT CONTRAST
MRA OF THE NECK WITHOUT AND WITH CONTRAST
TECHNIQUE: Multiplanar, multiecho pulse sequences of the brain, circle of
willis and surrounding structures were obtained without intravenous
contrast. Angiographic images of the neck were obtained using MRA
technique without and with intravenous contrast.
CONTRAST:  15mL MULTIHANCE GADOBENATE DIMEGLUMINE 529 MG/ML IV SOLN

[Series 2: tof_3d_multi-slab · axial · 0.7mm · 0.35mm/px · z∈[-61,+34]mm · 21 of 143 slices shown]
[im 1/143]
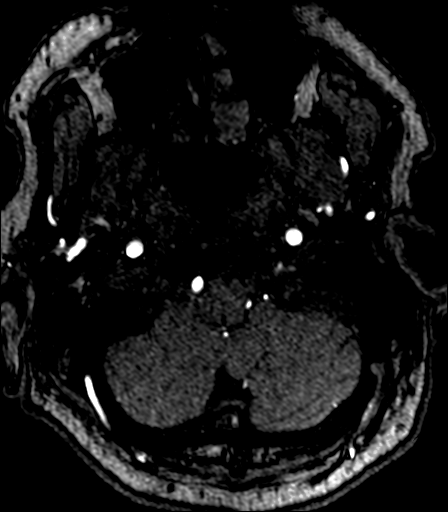
[im 4/143]
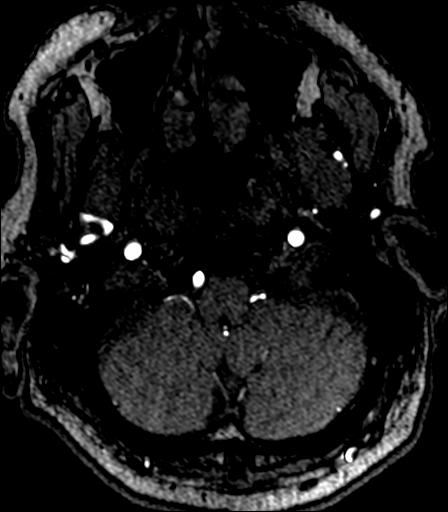
[im 7/143]
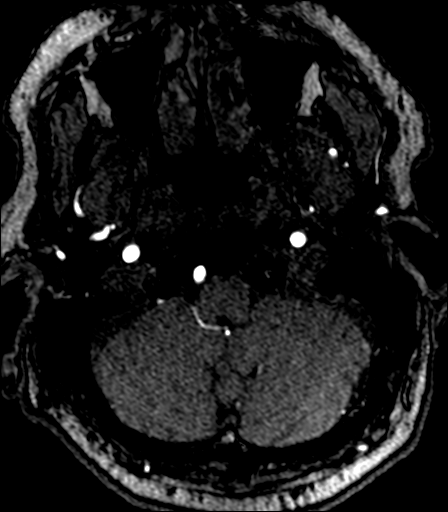
[im 10/143]
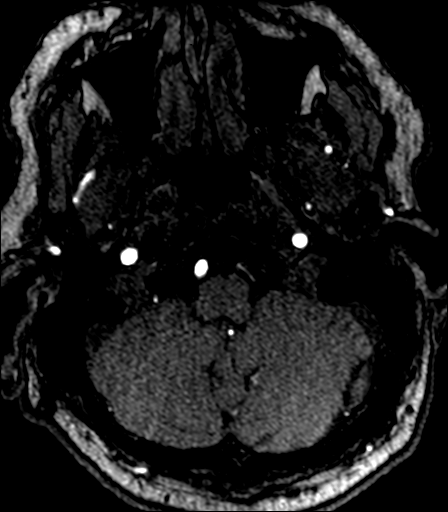
[im 13/143]
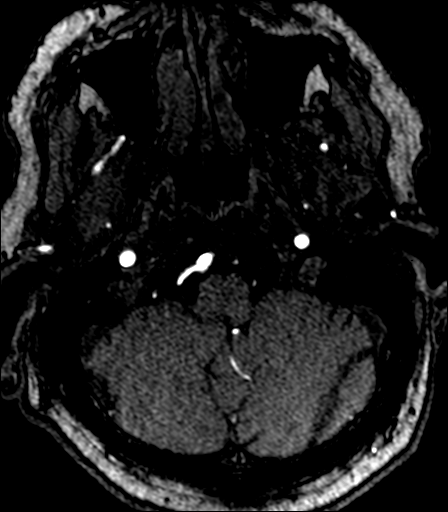
[im 16/143]
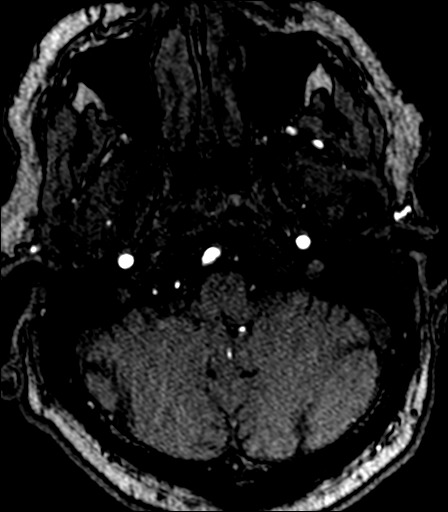
[im 19/143]
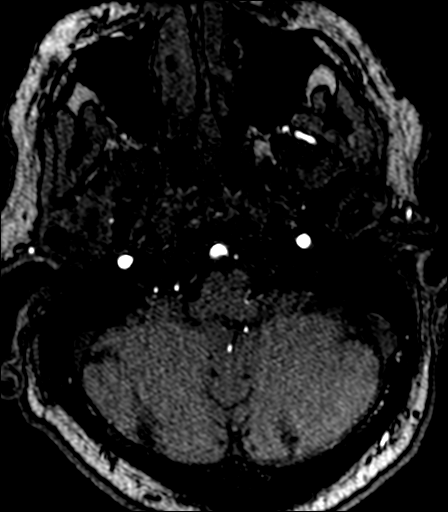
[im 22/143]
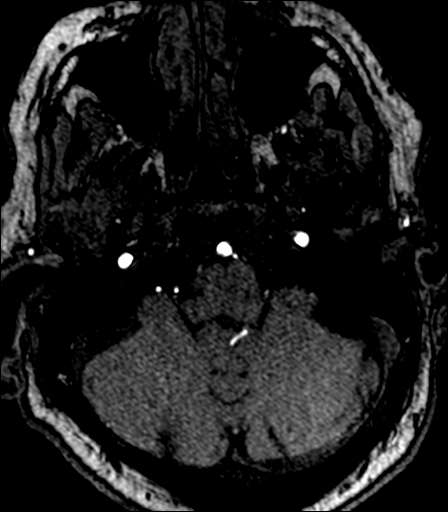
[im 25/143]
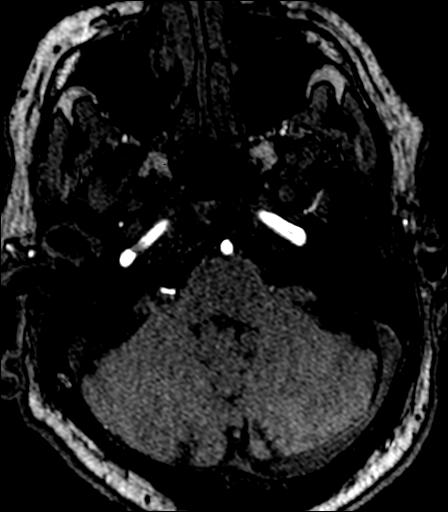
[im 28/143]
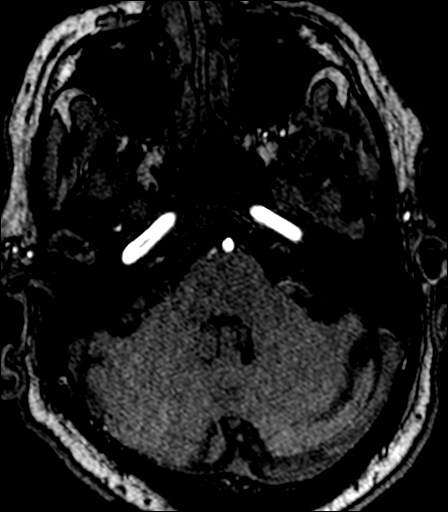
[im 31/143]
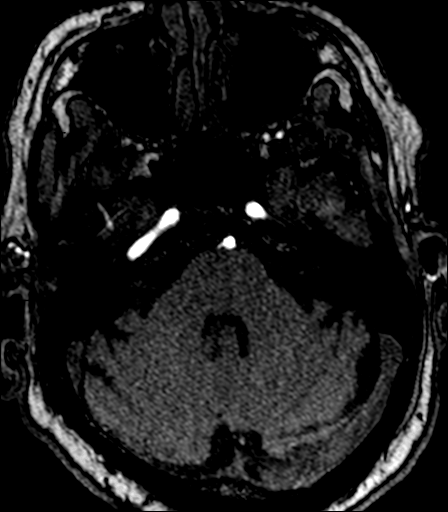
[im 34/143]
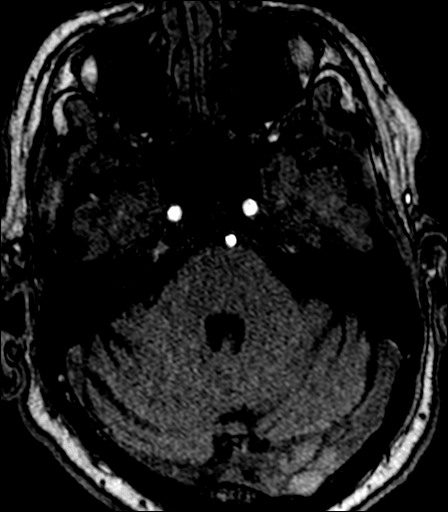
[im 37/143]
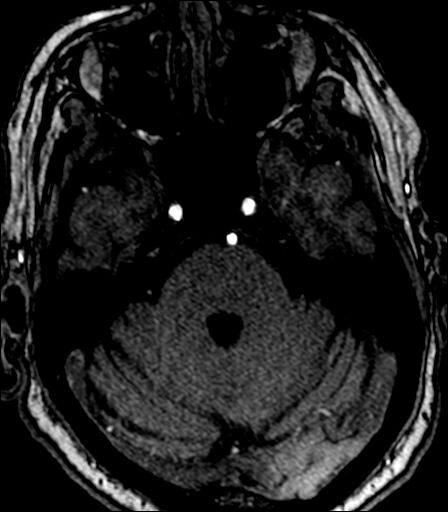
[im 46/143]
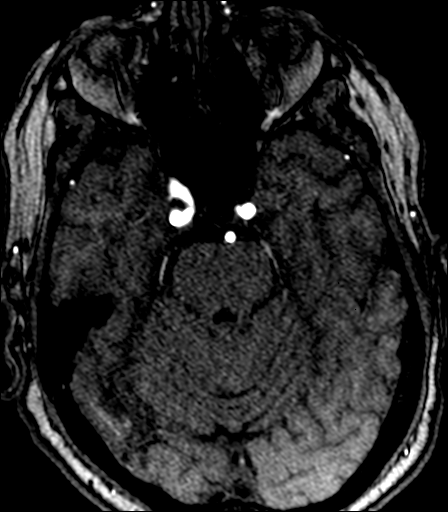
[im 64/143]
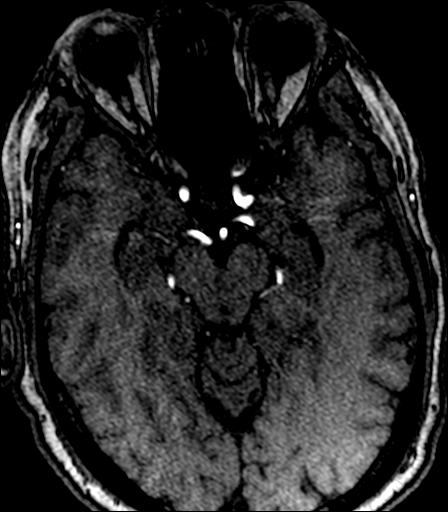
[im 73/143]
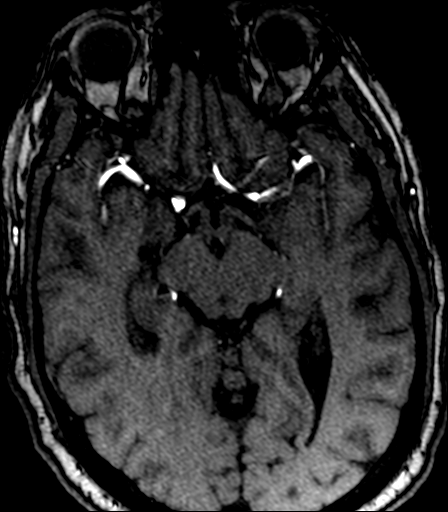
[im 82/143]
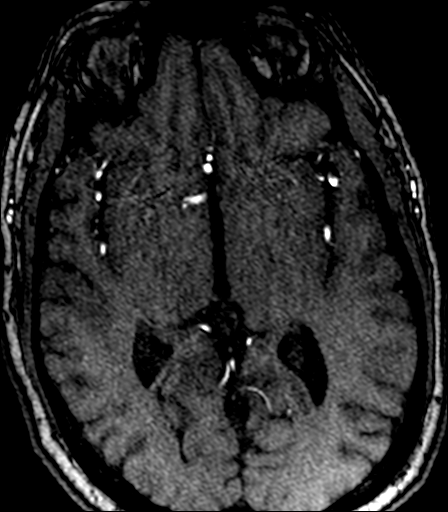
[im 100/143]
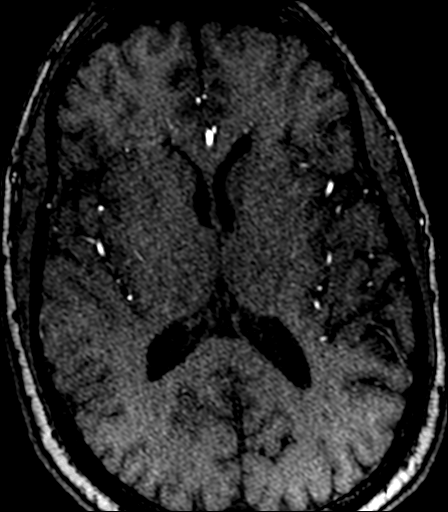
[im 118/143]
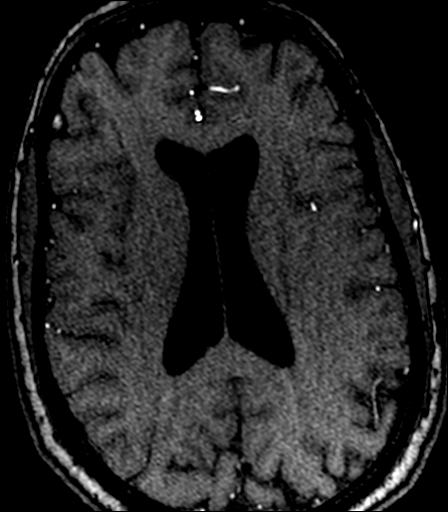
[im 121/143]
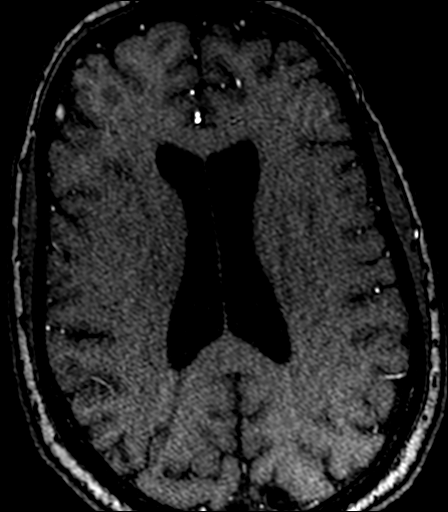
[im 136/143]
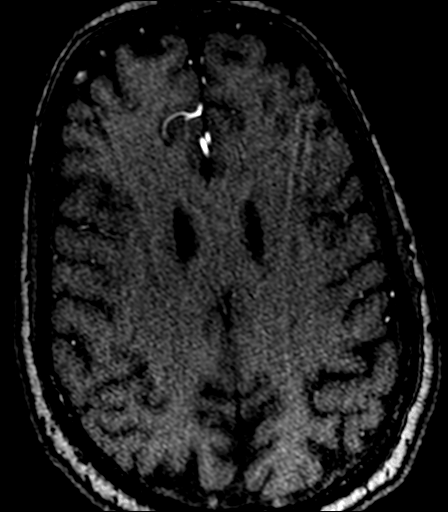

[21 of 48 positions shown; findings below may reference images not displayed]

FINDINGS: MR HEAD FINDINGS

There is no evidence of acute infarct.

No evidence of intracranial mass.

No midline shift or extra-axial fluid collection.

No chronic intracranial blood products.

No focal parenchymal signal abnormality is identified.

Cerebral volume is normal for age.

Vascular: Reported below.

Skull and upper cervical spine: No focal marrow lesion.

Sinuses/Orbits: Visualized orbits demonstrate no acute abnormality.
Minimal ethmoid sinus mucosal thickening. No significant mastoid
effusion.

MRA HEAD FINDINGS:

The intracranial internal carotid arteries are patent without
significant stenosis. The M1 middle cerebral arteries are patent
without significant stenosis. No M2 proximal branch occlusion or
high-grade proximal stenosis is identified. The anterior cerebral
arteries are patent without significant proximal stenosis. 1-2 mm
infundibulum versus tiny aneurysm arising from the A1 right anterior
cerebral artery (series 2, image 81) (series 9, image 12).

The visualized intracranial vertebral arteries are patent without
significant stenosis. The intracranial left vertebral artery is
developmentally diminutive and predominantly terminates as the left
PICA. The dominant intracranial right vertebral artery is patent
without stenosis, as is the basilar artery. The bilateral posterior
cerebral arteries are patent without significant proximal stenosis.

MRA NECK FINDINGS

Standard aortic branching. The visualized aortic arch is
unremarkable. No significant innominate or proximal subclavian
artery stenosis. The bilateral common and internal carotid arteries
are patent without stenosis. The right vertebral artery is
significantly dominant and patent throughout the neck without
stenosis. The non dominant left vertebral artery is patent
throughout the neck. There is moderate atherosclerotic narrowing at
the origin of this vessel
IMPRESSION: MRI brain:

1. Unremarkable MRI appearance of the brain for age. No evidence of
acute intracranial abnormality.
2. Minimal ethmoid sinus mucosal thickening.

MRA head:

1. No intracranial large vessel occlusion or proximal high-grade
arterial stenosis.
2. 1-2 mm infundibulum versus tiny aneurysm arising from the A1
right anterior cerebral artery.

MRA neck:

1. The common and internal carotid arteries are patent within the
neck without stenosis.
2. Moderate atherosclerotic narrowing at the origin of the non
dominant left vertebral artery.
3. The dominant right vertebral artery is patent throughout the neck
without stenosis.

## 2019-08-25 MED ORDER — GADOBENATE DIMEGLUMINE 529 MG/ML IV SOLN
15.0000 mL | Freq: Once | INTRAVENOUS | Status: AC | PRN
  Administered 2019-08-25: 15 mL via INTRAVENOUS

## 2019-10-05 NOTE — Progress Notes (Signed)
NEUROLOGY FOLLOW UP OFFICE NOTE  [name] [name]:[id number]  HISTORY OF PRESENT ILLNESS: [name] [name] is a 64 year old right-handed white male with CAD, HTN, dyslipidemia and history of superficial nodular basal cell carcinoma who follows up for cognitive deficits, dizziness and gait instability.  UPDATE: B12 from 08/22/2019 was 537.  He underwent MRI on 08/25/2019 which were personally reviewed.  MRI brain was unremarkable.  MRA of head and neck showed 1-2 mm infundibulum vs tiny aneurysm arising from the A1 right ACA as well as moderate atherosclerotic narrowing at origin of non-dominant vertebral artery but otherwise unremarkable.    In March, he had further episodes associated with elevated blood pressures ([id number] systolic).  He has cut down on caffeine intake and has decreased sodium intake.    Today, he reports that he had some left facial weakness during that period.  He reports prior episodes of high blood pressure with left facial droop, but no headache or dizziness.    HISTORY: In December 2019-January 2020, he developed a period of fluctuating blood pressure, dizziness, headache, unsteady gait, lasting 2 1/2 to 3 weeks.  He also noted left facial weakness.  He had changes in blood pressure medications and symptoms improved.  On 06/17/2019, he had the Shingles vaccine.  He didn't feel well afterwards.  In mid-January, he developed recurrence of symptoms but more severe.  He noted 3 or 4 days of some memory deficits such as difficulty recalling what he done the prior day.  He started waking up in the morning with a dull frontal headache lasting about 35 minutes frontal. 2-3 times a week  .  He began having recurrence of blood pressure spikes, reaching as high as 170s/60s-80s.  He also reported pressure in ears.  He also reports some loss of feelings in left hand and fingers (similar in past many years ago from C6-7 radiculopathy).  He feels more fatigued.  He sometimes takes a power nap in his  chair.  One time, he woke up and found himself slumped over in the chair to the right. Blood pressure medications were increased.  Over last week, symptoms have improved but not subsided.  Still has some fluctuations in BP.  Sharp shooting pain from right occipital to front 10-15 seconds maybe once a day.     06/12/2019 LABS:  TSH 1.610; LDL 63  PAST MEDICAL HISTORY: Past Medical History:  Diagnosis Date  . Back problem   . Coronary artery disease (CAD) excluded    status post cardiac CT scan with only mild plaquing and no obstructive lesions 2006status post stress echo, ejection fraction 55% able to achieved 10  METS  . Dyslipidemia   . History of MRSA infection   . Hypertriglyceridemia   . 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   Left Lower Flank    MEDICATIONS: Current Outpatient Medications on File Prior to Visit  Medication Sig Dispense Refill  . amLODipine-benazepril (LOTREL) 5-10 MG capsule TAKE ONE CAPSULE BY MOUTH DAILY 90 capsule 3  . aspirin 81 MG EC tablet Take 81 mg by mouth daily.      Marland Kitchen aspirin EC 81 MG tablet Take 81 mg by mouth daily.    Marland Kitchen atenolol (TENORMIN) 25 MG tablet TAKE 1/2 TABLET BY MOUTH EVERY DAY (Patient taking differently: 6.25 mg. ) 15 tablet 5  . atorvastatin (LIPITOR) 80 MG tablet TAKE 1 TABLET BY MOUTH EVERY DAY 90 tablet 1  . Coenzyme Q10 (COQ10 PO) Take by mouth.    Marland Kitchen  dutasteride (AVODART) 0.5 MG capsule Take 0.5 mg by mouth daily.    . Dutasteride-Tamsulosin HCl (JALYN) 0.5-0.4 MG CAPS Take 1 capsule by mouth daily.      Marland Kitchen ezetimibe (ZETIA) 10 MG tablet TAKE 1 TABLET BY MOUTH EVERY DAY 90 tablet 1  . Multiple Vitamin (MULTIVITAMIN) capsule Take 1 capsule by mouth daily.      . Omega-3 1400 MG CAPS Take 2 capsules by mouth 2 (two) times daily.     . tamsulosin (FLOMAX) 0.4 MG CAPS capsule Take 0.4 mg by mouth daily.     No current facility-administered medications on file prior to visit.    ALLERGIES: Allergies  Allergen  Reactions  . Bactroban [Mupirocin]   . Penicillins     FAMILY HISTORY: Family History  Problem Relation Age of Onset  . Heart disease Other   . Hypertension Other   . Stroke Other   . Heart attack Father   . Hypertension Father   . Hyperlipidemia Father   . Heart attack Mother   . Hypertension Mother   . Hyperlipidemia Mother    SOCIAL HISTORY: Social History   Socioeconomic History  . Marital status: Married    Spouse name: Margaretha Sheffield  . Number of children: Not on file  . Years of education: Not on file  . Highest education level: Not on file  Occupational History  . Not on file  Tobacco Use  . Smoking status: Never Smoker  . Smokeless tobacco: Never Used  Substance and Sexual Activity  . Alcohol use: Yes    Comment: daily  . Drug use: No  . Sexual activity: Not on file  Other Topics Concern  . Not on file  Social History Narrative   Right handed   Two story home   Diet sundrop occasionally   Social Determinants of Health   Financial Resource Strain:   . Difficulty of Paying Living Expenses:   Food Insecurity:   . Worried About Charity fundraiser in the Last Year:   . Arboriculturist in the Last Year:   Transportation Needs:   . Film/video editor (Medical):   Marland Kitchen Lack of Transportation (Non-Medical):   Physical Activity:   . Days of Exercise per Week:   . Minutes of Exercise per Session:   Stress:   . Feeling of Stress :   Social Connections:   . Frequency of Communication with Friends and Family:   . Frequency of Social Gatherings with Friends and Family:   . Attends Religious Services:   . Active Member of Clubs or Organizations:   . Attends Archivist Meetings:   Marland Kitchen Marital Status:   Intimate Partner Violence:   . Fear of Current or Ex-Partner:   . Emotionally Abused:   Marland Kitchen Physically Abused:   . Sexually Abused:    PHYSICAL EXAM: Blood pressure (!) 144/79, pulse 66, height 5\' 10"  (1.778 m), weight 174 lb 12.8 oz (79.3 kg), SpO2 97  %. General: No acute distress.  Patient appears well-groomed.   Head:  Normocephalic/atraumatic Eyes:  Fundi examined but not visualized Neck: supple, no paraspinal tenderness, full range of motion Heart:  Regular rate and rhythm Lungs:  Clear to auscultation bilaterally Back: No paraspinal tenderness Neurological Exam: alert and oriented to person, place, and time. Attention span and concentration intact, recent and remote memory intact, fund of knowledge intact.  Speech fluent and not dysarthric, language intact.  Slight flattening of left nasolabial fold.  Otherwise, CN  II-XII intact. Bulk and tone normal, muscle strength 5/5 throughout.  Sensation to light touch, temperature and vibration intact.  Deep tendon reflexes 2+ throughout, toes downgoing.  Finger to nose and heel to shin testing intact.  Gait normal, Romberg negative.  IMPRESSION: 1.  Recurrent transient episode (headache, confusion, dizziness, and left facial droop) in setting of elevated blood pressure.  Unclear if TIA vs hypertensive urgency.  Given the slight facial droop, I would be cautious and treat for possible TIA. 2. Elevated blood pressure 3.  Hyperlipidemia  PLAN: 1.  I will start Plavix 75mg  daily.  He may discontinue ASA unless his cardiologist, Dr. Claiborne Billings, has objection. 2.  Check echocardiogram 3.  Optimize blood pressure (goal less than 130/90) 4.  Continue atorvastatin (LDL at goal less than 70) 5.  Follow up in 4 months.  Metta Clines, DO  CC:  Donald Prose, MD  Shelva Majestic, MD

## 2019-10-06 ENCOUNTER — Other Ambulatory Visit: Payer: Self-pay

## 2019-10-06 ENCOUNTER — Encounter: Payer: Self-pay | Admitting: Neurology

## 2019-10-06 ENCOUNTER — Ambulatory Visit: Payer: 59 | Admitting: Neurology

## 2019-10-06 VITALS — BP 144/79 | HR 66 | Ht 70.0 in | Wt 174.8 lb

## 2019-10-06 DIAGNOSIS — E785 Hyperlipidemia, unspecified: Secondary | ICD-10-CM | POA: Diagnosis not present

## 2019-10-06 DIAGNOSIS — I160 Hypertensive urgency: Secondary | ICD-10-CM

## 2019-10-06 DIAGNOSIS — G459 Transient cerebral ischemic attack, unspecified: Secondary | ICD-10-CM

## 2019-10-06 MED ORDER — CLOPIDOGREL BISULFATE 75 MG PO TABS: 75.0000 mg | ORAL_TABLET | Freq: Every day | ORAL | 5 refills | Status: DC

## 2019-10-06 NOTE — Patient Instructions (Signed)
1.  I will start you on clopidogrel 75mg  daily, a blood thinner.  If Dr. Claiborne Billings has no objection, I would discontinue aspirin. 2.  We will check echocardiogram 3.  Optimize blood pressure control (less than [id number] systolic and 90 diastolic) 4.  Follow up in 4 months.

## 2019-10-16 ENCOUNTER — Other Ambulatory Visit: Payer: Self-pay

## 2019-10-16 ENCOUNTER — Ambulatory Visit (HOSPITAL_COMMUNITY): Payer: 59 | Attending: Cardiology

## 2019-10-16 DIAGNOSIS — G459 Transient cerebral ischemic attack, unspecified: Secondary | ICD-10-CM | POA: Diagnosis not present

## 2019-10-16 MED ORDER — SODIUM CHLORIDE 0.9% FLUSH
10.0000 mL | INTRAVENOUS | Status: AC | PRN
  Administered 2019-10-16: 20 mL via INTRAVENOUS

## 2019-11-09 ENCOUNTER — Ambulatory Visit: Payer: 59 | Admitting: Neurology

## 2019-11-19 ENCOUNTER — Other Ambulatory Visit: Payer: Self-pay | Admitting: Cardiovascular Disease

## 2019-12-21 ENCOUNTER — Other Ambulatory Visit: Payer: Self-pay | Admitting: Cardiovascular Disease

## 2019-12-21 DIAGNOSIS — E785 Hyperlipidemia, unspecified: Secondary | ICD-10-CM

## 2020-02-08 NOTE — Progress Notes (Signed)
NEUROLOGY FOLLOW UP OFFICE NOTE  [name] [name] [id number]  HISTORY OF PRESENT ILLNESS: [name] [name] is a 66 year oldright-handed white male with CAD, HTN, dyslipidemia and history of superficial nodular basal cell carcinoma who follows up for transient spells.  UPDATE: ASA was switched to Plavix. Echocardiogram with bubble study from May showed no cardiac source of embolus. Feeling well.  No recurrent spells.  HISTORY: In December 2019-January 2020,he developed a period of fluctuating blood pressure, dizziness, headache, unsteady gait, lasting2 1/2 to 3 weeks.  He also noted left facial weakness. He had changes in blood pressure medications and symptoms improved. On 06/17/2019, he had the Shingles vaccine. He didn't feel well afterwards.In mid-January, he developed recurrence of symptoms but more severe. He noted 3 or 4 days ofsome memory deficits such as difficulty recalling what he done the prior day.He started waking up in the morning with a dull frontalheadachelasting about35 minutes frontal. 2-3 times a week . He began having recurrence of blood pressure spikes, reaching as high as 170s/60s-80s.He also reported pressure in ears.He also reports some loss of feelings in left hand and fingers (similarin past many years agofrom C6-7 radiculopathy).He feels more fatigued. He sometimes takes a power nap in his chair. One time, he woke up and found himself slumped over in the chair to the right. Blood pressure medications were increased. Over last week, symptoms have improved but not subsided. Still has some fluctuations in BP. Sharp shooting pain from right occipital to front 10-15 seconds maybe once a day.   B12 from 08/22/2019 was 537.  He underwent MRI of brain from 08/25/2019 was unremarkable.  MRA of head and neck showed 1-2 mm infundibulum vs tiny aneurysm arising from the A1 right ACA as well as moderate atherosclerotic narrowing at origin of non-dominant  vertebral artery but otherwise unremarkable.    In March 2021, he had further episodes associated with elevated blood pressures (409B systolic) with some left facial weakness.  He has cut down on caffeine intake and has decreased sodium intake.    He reports prior episodes of high blood pressure with left facial droop, but no headache or dizziness.    PAST MEDICAL HISTORY: Past Medical History:  Diagnosis Date  . Back problem   . Coronary artery disease (CAD) excluded    status post cardiac CT scan with only mild plaquing and no obstructive lesions 2006status post stress echo, ejection fraction 55% able to achieved 10  METS  . Dyslipidemia   . History of MRSA infection   . Hypertriglyceridemia   . 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   Left Lower Flank    MEDICATIONS: Current Outpatient Medications on File Prior to Visit  Medication Sig Dispense Refill  . amLODipine-benazepril (LOTREL) 5-10 MG capsule TAKE ONE CAPSULE BY MOUTH DAILY 90 capsule 3  . aspirin 81 MG EC tablet Take 81 mg by mouth daily.      Marland Kitchen aspirin EC 81 MG tablet Take 81 mg by mouth daily.    Marland Kitchen atenolol (TENORMIN) 25 MG tablet TAKE 1/2 TABLET BY MOUTH EVERY DAY 90 tablet 3  . atorvastatin (LIPITOR) 80 MG tablet TAKE 1 TABLET BY MOUTH EVERY DAY 90 tablet 1  . clopidogrel (PLAVIX) 75 MG tablet Take 1 tablet (75 mg total) by mouth daily. 30 tablet 5  . Coenzyme Q10 (COQ10 PO) Take by mouth.    . dutasteride (AVODART) 0.5 MG capsule Take 0.5 mg by mouth daily.    Marilu Favre  HCl (JALYN) 0.5-0.4 MG CAPS Take 1 capsule by mouth daily.      Marland Kitchen ezetimibe (ZETIA) 10 MG tablet TAKE 1 TABLET BY MOUTH EVERY DAY 90 tablet 1  . Multiple Vitamin (MULTIVITAMIN) capsule Take 1 capsule by mouth daily.      . Omega-3 1400 MG CAPS Take 2 capsules by mouth 2 (two) times daily.     . tamsulosin (FLOMAX) 0.4 MG CAPS capsule Take 0.4 mg by mouth daily.     Current Facility-Administered Medications on  File Prior to Visit  Medication Dose Route Frequency Provider Last Rate Last Admin  . sodium chloride flush (NS) 0.9 % injection 10 mL  10 mL Intravenous PRN Salem Mastrogiovanni R, DO   20 mL at 10/16/19 1401    ALLERGIES: Allergies  Allergen Reactions  . Bactroban [Mupirocin]   . Penicillins     FAMILY HISTORY: Family History  Problem Relation Age of Onset  . Heart disease Other   . Hypertension Other   . Stroke Other   . Heart attack Father   . Hypertension Father   . Hyperlipidemia Father   . Heart attack Mother   . Hypertension Mother   . Hyperlipidemia Mother    SOCIAL HISTORY: Social History   Socioeconomic History  . Marital status: Married    Spouse name: Margaretha Sheffield  . Number of children: Not on file  . Years of education: Not on file  . Highest education level: Not on file  Occupational History  . Not on file  Tobacco Use  . Smoking status: Never Smoker  . Smokeless tobacco: Never Used  Vaping Use  . Vaping Use: Never used  Substance and Sexual Activity  . Alcohol use: Yes    Comment: daily  . Drug use: No  . Sexual activity: Not on file  Other Topics Concern  . Not on file  Social History Narrative   Right handed   Two story home   Diet sundrop occasionally   Social Determinants of Health   Financial Resource Strain:   . Difficulty of Paying Living Expenses: Not on file  Food Insecurity:   . Worried About Charity fundraiser in the Last Year: Not on file  . Ran Out of Food in the Last Year: Not on file  Transportation Needs:   . Lack of Transportation (Medical): Not on file  . Lack of Transportation (Non-Medical): Not on file  Physical Activity:   . Days of Exercise per Week: Not on file  . Minutes of Exercise per Session: Not on file  Stress:   . Feeling of Stress : Not on file  Social Connections:   . Frequency of Communication with Friends and Family: Not on file  . Frequency of Social Gatherings with Friends and Family: Not on file  . Attends  Religious Services: Not on file  . Active Member of Clubs or Organizations: Not on file  . Attends Archivist Meetings: Not on file  . Marital Status: Not on file  Intimate Partner Violence:   . Fear of Current or Ex-Partner: Not on file  . Emotionally Abused: Not on file  . Physically Abused: Not on file  . Sexually Abused: Not on file     PHYSICAL EXAM: Blood pressure (!) 147/82, pulse 63, height 5\' 10"  (1.778 m), weight 173 lb (78.5 kg), SpO2 100 %. General: No acute distress.  Patient appears well-groomed.   Head:  Normocephalic/atraumatic Eyes:  Fundi examined but not visualized Neck: supple,  no paraspinal tenderness, full range of motion Heart:  Regular rate and rhythm Lungs:  Clear to auscultation bilaterally Back: No paraspinal tenderness Neurological Exam: alert and oriented to person, place, and time. Attention span and concentration intact, recent and remote memory intact, fund of knowledge intact.  Speech fluent and not dysarthric, language intact.  CN II-XII intact. Bulk and tone normal, muscle strength 5/5 throughout.  Sensation to light touch, temperature and vibration intact.  Deep tendon reflexes 2+ throughout, toes downgoing.  Finger to nose and heel to shin testing intact.  Gait normal, Romberg negative.  IMPRESSION: 1.  Recurrent transient episodes (headache, confusion, dizziness, and left facial droop) in setting of elevated blood pressure.  Unclear if TIA vs hypertensive urgency.  Given the slight facial droop, I would be cautious and treat for possible TIA. 2.  Cerebral aneurysm  PLAN: Secondary stroke prevention: -  Plavix 75mg  daily -  Atorvastatin (LDL goal less than 70) -  Blood pressure control (goal less than 130/90) -  Glycemic control (Hgb A1c goal less than 7) 2.  Repeat MRA of head in 6 months. 3.  Follow up in 6 months (after repeat MRA). Metta Clines, DO  CC:  Donald Prose, MD

## 2020-02-13 ENCOUNTER — Ambulatory Visit: Payer: 59 | Admitting: Neurology

## 2020-02-13 ENCOUNTER — Other Ambulatory Visit: Payer: Self-pay

## 2020-02-13 ENCOUNTER — Encounter: Payer: Self-pay | Admitting: Neurology

## 2020-02-13 VITALS — BP 147/82 | HR 63 | Ht 70.0 in | Wt 173.0 lb

## 2020-02-13 DIAGNOSIS — I671 Cerebral aneurysm, nonruptured: Secondary | ICD-10-CM

## 2020-02-13 DIAGNOSIS — G459 Transient cerebral ischemic attack, unspecified: Secondary | ICD-10-CM

## 2020-02-13 NOTE — Patient Instructions (Addendum)
1.  We will repeat MRA of head in 6 months. We have sent a referral to [facility] for your MRI and they will call you directly to schedule your appointment. They are located at [place]. If you need to contact them directly please call [phone].  Follow up afterwards. 2.  Consider looking into the [facility] [place] 3.  Continue Plavix 75mg  daily and other medications to treat stroke risk factors.

## 2020-04-04 ENCOUNTER — Telehealth: Payer: Self-pay | Admitting: Cardiovascular Disease

## 2020-04-04 DIAGNOSIS — Z79899 Other long term (current) drug therapy: Secondary | ICD-10-CM

## 2020-04-04 DIAGNOSIS — E785 Hyperlipidemia, unspecified: Secondary | ICD-10-CM

## 2020-04-04 DIAGNOSIS — I2510 Atherosclerotic heart disease of native coronary artery without angina pectoris: Secondary | ICD-10-CM

## 2020-04-04 DIAGNOSIS — I10 Essential (primary) hypertension: Secondary | ICD-10-CM

## 2020-04-04 DIAGNOSIS — N419 Inflammatory disease of prostate, unspecified: Secondary | ICD-10-CM

## 2020-04-04 NOTE — Telephone Encounter (Signed)
Pt wanted to know if he will need new lab orders before his appt 08/01/20. Please send a MyChart message  If needed

## 2020-04-08 ENCOUNTER — Encounter: Payer: Self-pay | Admitting: *Deleted

## 2020-04-08 NOTE — Telephone Encounter (Signed)
Labs ordered-will send patient message via Mychart per patient request

## 2020-05-21 ENCOUNTER — Other Ambulatory Visit: Payer: Self-pay | Admitting: Orthopedic Surgery

## 2020-05-21 DIAGNOSIS — R2232 Localized swelling, mass and lump, left upper limb: Secondary | ICD-10-CM

## 2020-06-02 ENCOUNTER — Other Ambulatory Visit: Payer: Self-pay | Admitting: Cardiovascular Disease

## 2020-06-02 DIAGNOSIS — E785 Hyperlipidemia, unspecified: Secondary | ICD-10-CM

## 2020-06-12 ENCOUNTER — Ambulatory Visit: Payer: 59 | Admitting: Dermatology

## 2020-07-02 ENCOUNTER — Ambulatory Visit
Admission: RE | Admit: 2020-07-02 | Discharge: 2020-07-02 | Disposition: A | Discharge status: Home / Self Care | Payer: 59 | Source: Ambulatory Visit | Attending: Orthopedic Surgery | Admitting: Orthopedic Surgery

## 2020-07-02 DIAGNOSIS — R2232 Localized swelling, mass and lump, left upper limb: Secondary | ICD-10-CM

## 2020-07-02 IMAGING — US US EXTREM UP*L* LTD
1 series · 14 of 24 positions shown · non-contrast
Comparison: None.

CLINICAL DATA: Left ring finger volar mass for the past year and a
half.

EXAM:
ULTRASOUND LEFT UPPER EXTREMITY LIMITED
TECHNIQUE: Ultrasound examination of the upper extremity soft tissues was
performed in the area of clinical concern.

[Series 1: us extrem up*left* ltd · 0.04mm/px · 24 acquisitions, 14 frames shown]
[im 1/24]
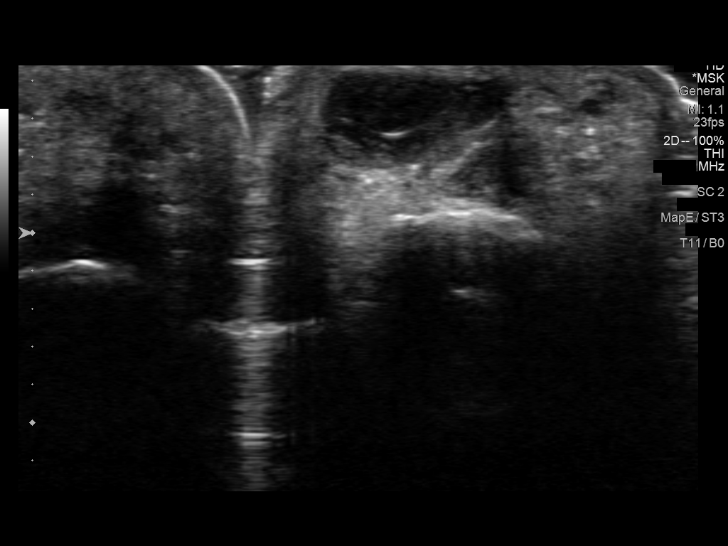
[im 3/24]
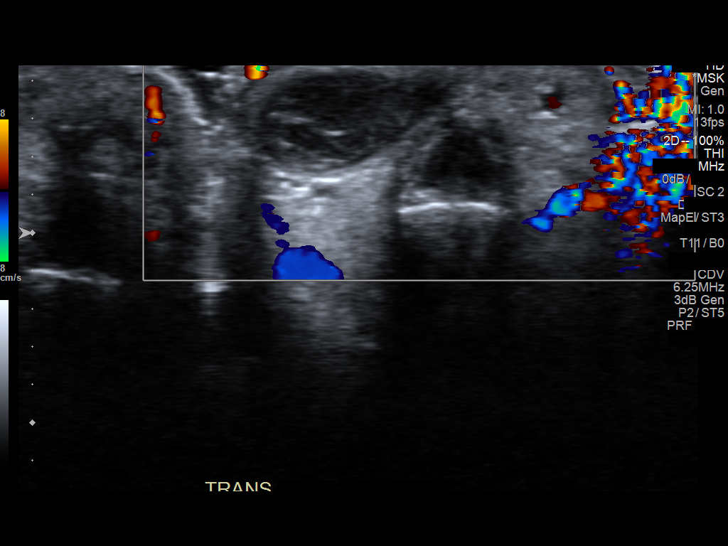
[im 5/24]
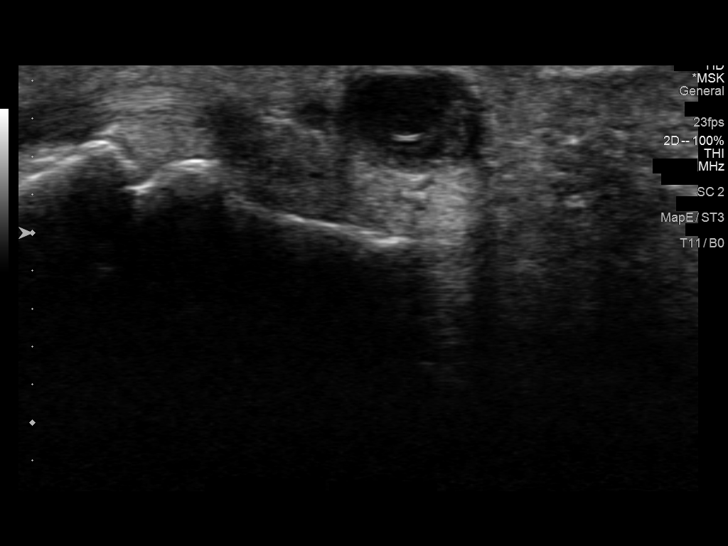
[im 7/24]
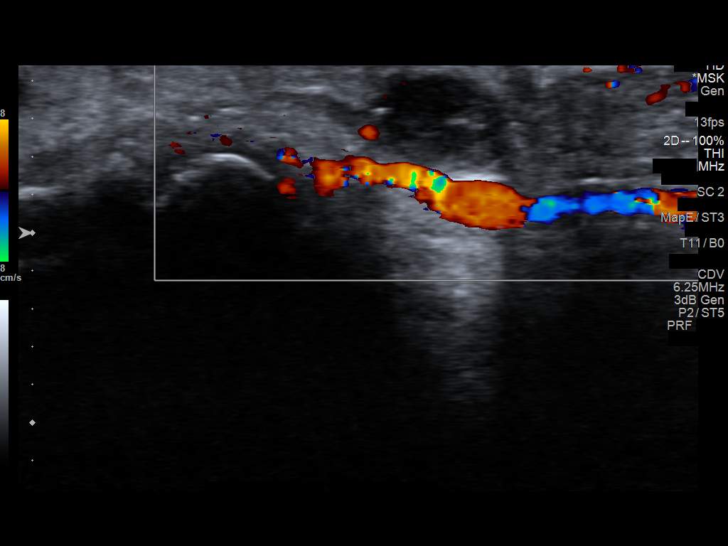
[im 8/24]
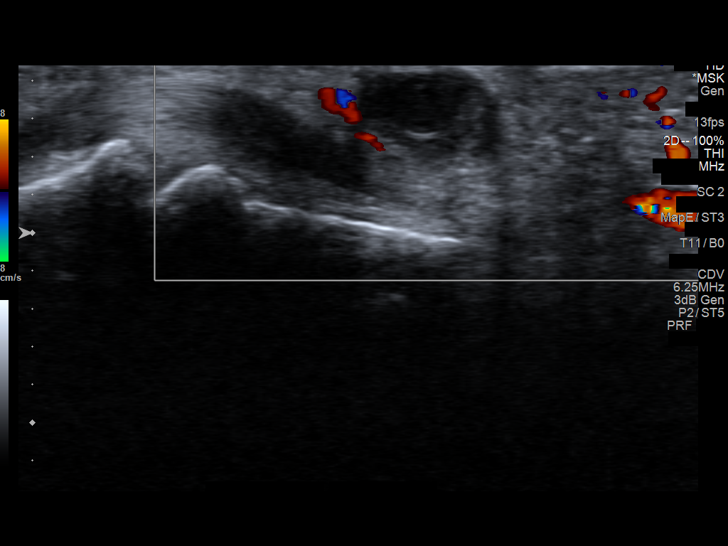
[im 10/24]
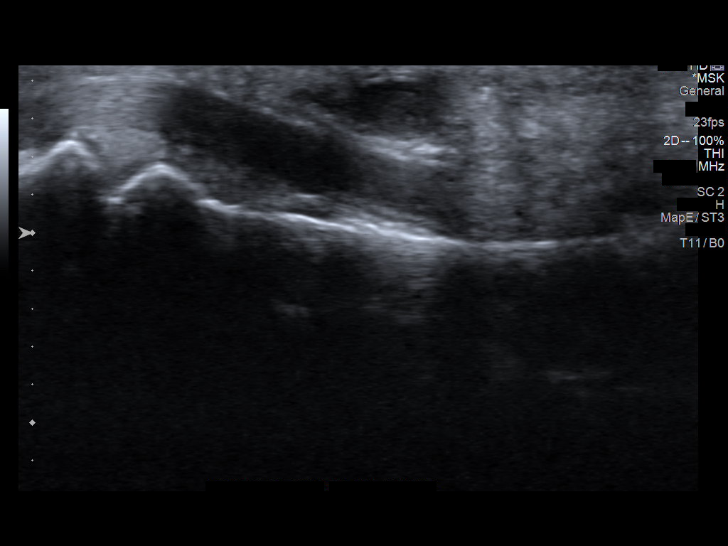
[im 12/24]
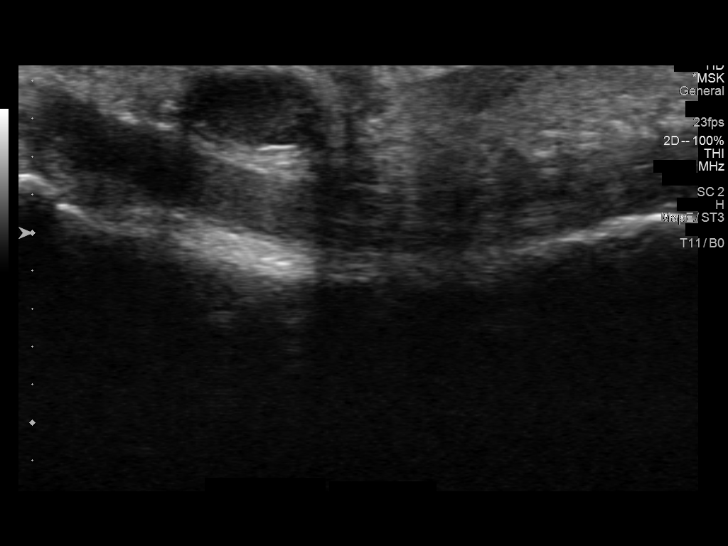
[im 13/24]
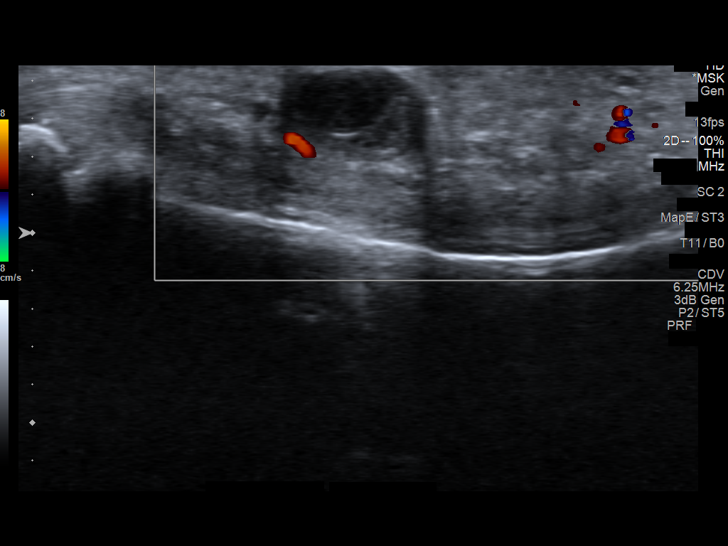
[im 15/24]
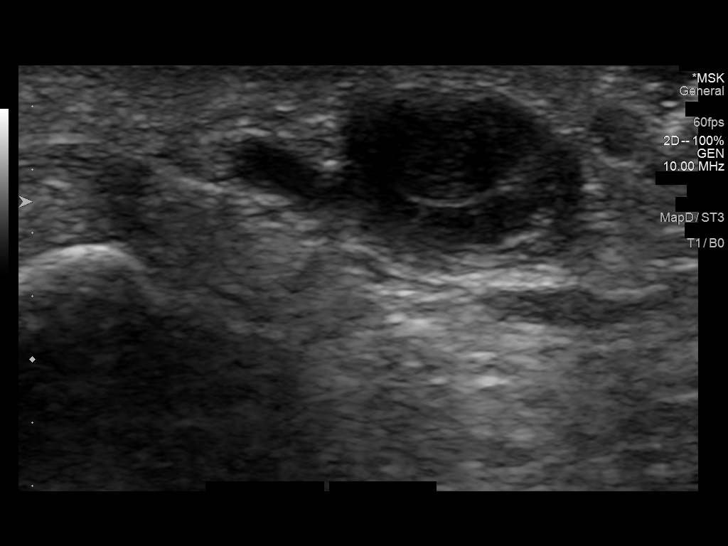
[im 17/24]
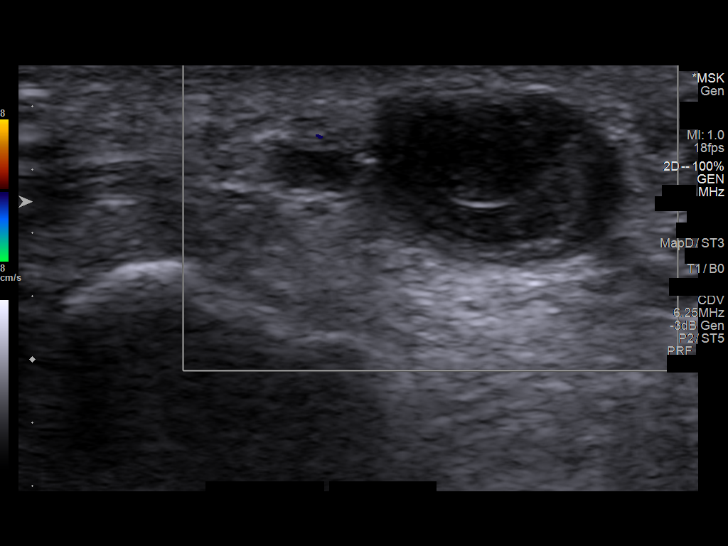
[im 19/24]
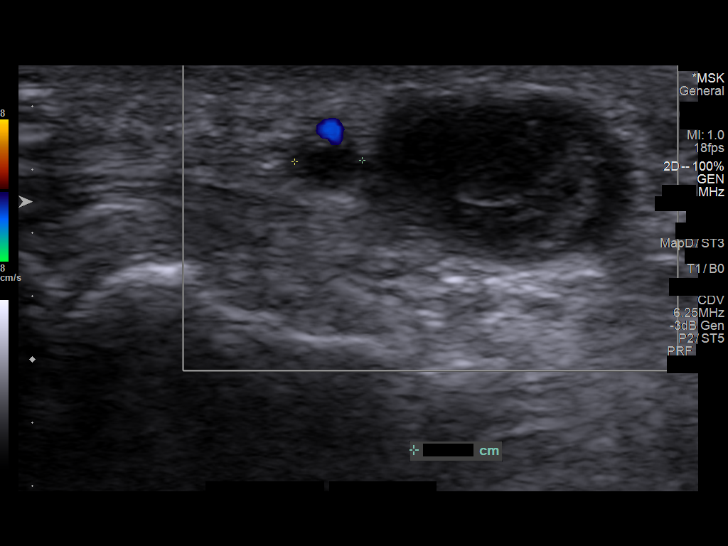
[im 20/24]
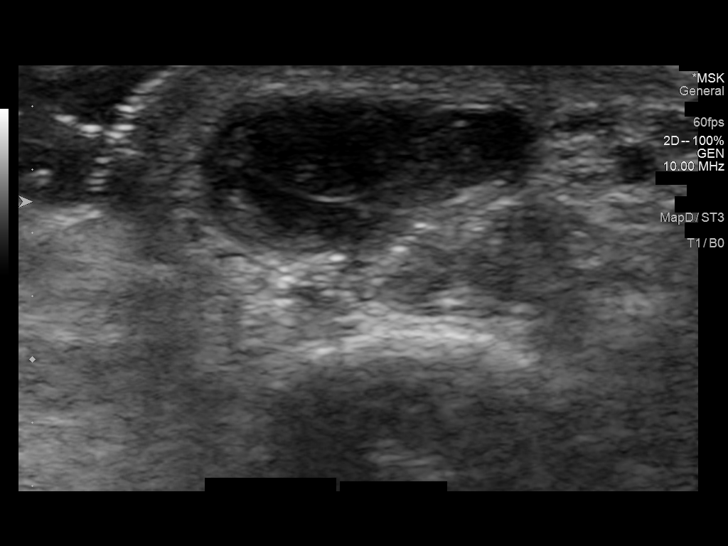
[im 22/24]
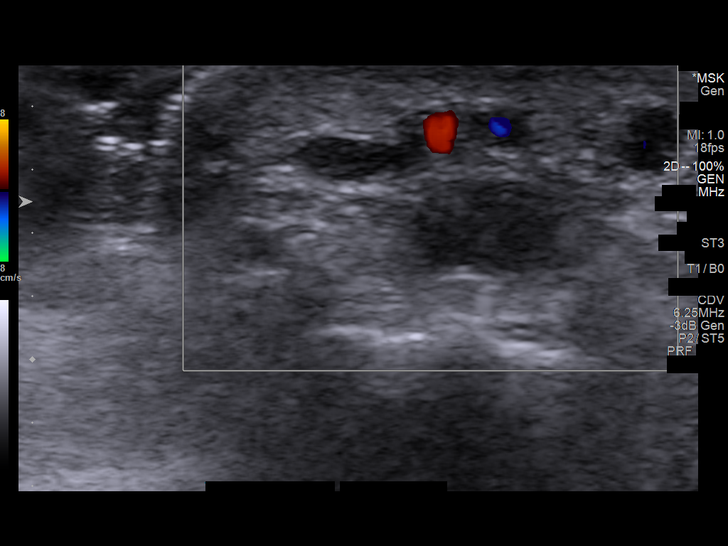
[im 24/24]
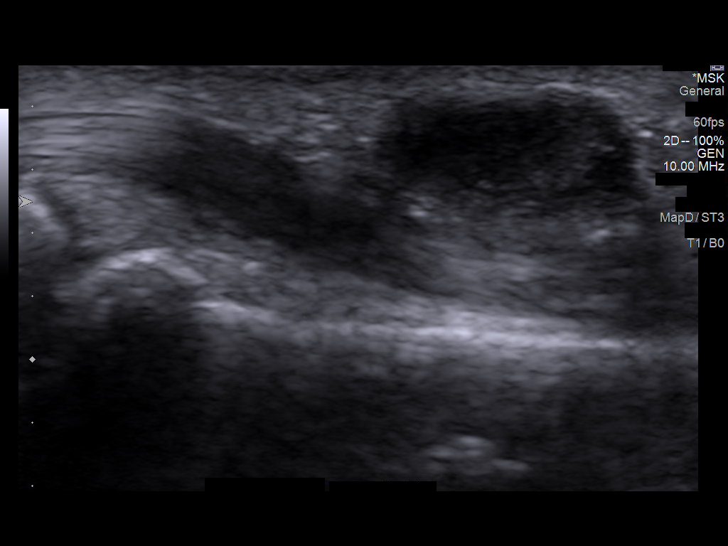

[14 of 24 positions shown; findings below may reference images not displayed]

FINDINGS: Focused ultrasound of the radial and volar aspect of the left ring
finger at the level of the proximal phalanx demonstrates a 1.0 x
x 0.6 cm complex hypoechoic mass adjacent to the fourth flexor
tendons. There is some posterior acoustic enhancement and no
discrete internal vascularity.
IMPRESSION: 1. Complex hypoechoic mass adjacent to the flexor tendons in the
radial and volar aspect of the left ring finger. Differential
considerations include giant cell tumor of the tendon sheath and
epidermal inclusion cyst.

## 2020-07-12 ENCOUNTER — Other Ambulatory Visit: Payer: Self-pay | Admitting: Orthopedic Surgery

## 2020-07-17 ENCOUNTER — Encounter (HOSPITAL_BASED_OUTPATIENT_CLINIC_OR_DEPARTMENT_OTHER): Payer: Self-pay | Admitting: Orthopedic Surgery

## 2020-07-17 ENCOUNTER — Other Ambulatory Visit: Payer: Self-pay

## 2020-07-19 ENCOUNTER — Telehealth: Payer: Self-pay | Admitting: *Deleted

## 2020-07-19 ENCOUNTER — Encounter (HOSPITAL_BASED_OUTPATIENT_CLINIC_OR_DEPARTMENT_OTHER)
Admission: RE | Admit: 2020-07-19 | Discharge: 2020-07-19 | Disposition: A | Discharge status: Home / Self Care | Payer: 59 | Source: Ambulatory Visit | Attending: Orthopedic Surgery | Admitting: Orthopedic Surgery

## 2020-07-19 ENCOUNTER — Other Ambulatory Visit (HOSPITAL_COMMUNITY)
Admission: RE | Admit: 2020-07-19 | Discharge: 2020-07-19 | Disposition: A | Discharge status: Home / Self Care | Payer: 59 | Source: Ambulatory Visit | Attending: Orthopedic Surgery | Admitting: Orthopedic Surgery

## 2020-07-19 DIAGNOSIS — Z01812 Encounter for preprocedural laboratory examination: Secondary | ICD-10-CM | POA: Insufficient documentation

## 2020-07-19 DIAGNOSIS — Z01810 Encounter for preprocedural cardiovascular examination: Secondary | ICD-10-CM | POA: Insufficient documentation

## 2020-07-19 DIAGNOSIS — Z20822 Contact with and (suspected) exposure to covid-19: Secondary | ICD-10-CM | POA: Insufficient documentation

## 2020-07-19 LAB — SARS CORONAVIRUS 2 (TAT 6-24 HRS): SARS Coronavirus 2: NEGATIVE

## 2020-07-19 NOTE — Telephone Encounter (Signed)
   Primary Cardiologist: Shelva Majestic, MD  Chart reviewed as part of pre-operative protocol coverage. Because of Shafiq Larch Spiegelman's past medical history and time since last visit, he will require a follow-up visit in order to better assess preoperative cardiovascular risk.  His last OV was >1 year ago (06/2019), therefore needs in office visit in order to clear. He does have a follow-up coming up soon with Dr. Claiborne Billings at which time clearance can be addressed if surgery is not time sensitive. Otherwise would try to move up follow-up appt.  Pre-op covering staff: - Please schedule appointment and call patient to inform them. If patient already had an upcoming appointment within acceptable timeframe, please add "pre-op clearance" to the appointment notes so provider is aware. - Please contact requesting surgeon's office via preferred method (i.e, phone, fax) to inform them of need for appointment prior to surgery.  Will hold off routing to primary cardiologist as he might actually see Dr. Claiborne Billings himself in follow-up if the 2/24 appt is kept.  Charlie Pitter, PA-C  07/19/2020, 1:51 PM

## 2020-07-19 NOTE — Progress Notes (Signed)

## 2020-07-19 NOTE — Telephone Encounter (Signed)
I s/w Hassan Rowan with Dr. Daryll Brod office and explained the pt will need appt with cardiologist for clearance. Pt is currently scheduled to see Dr. Claiborne Billings 08/01/20 which pt will be assessed at that time for clearance. I did inquire if this is an URGENT surgery, which Hassan Rowan states is not. Hassan Rowan stated she will contact the pt and post pone surgery until pt has been seen and cleared by Dr. Claiborne Billings. Hassan Rowan thanked me for the call.

## 2020-07-19 NOTE — Telephone Encounter (Signed)
   [facility] Pre-operative Risk Assessment     Request for surgical clearance:  1. What type of surgery is being performed? EXCISION MASS PROXIMAL PHALANX LEFT RING FINGER    2. When is this surgery scheduled? 07/23/2020    3. What type of clearance is required (medical clearance vs. Pharmacy clearance to hold med vs. Both)? BOTH  4. Are there any medications that need to be held prior to surgery and how long?PLAVIX   5. Practice name and name of physician performing surgery? [facility]    6. What is the office phone number? [phone]   7.   What is the office fax number? [phone] Selina Cooley  8.   Anesthesia type (None, local, MAC, general) ? IV REGIONAL FAB   Alvina Filbert 07/19/2020, 1:06 PM  _________________________________________________________________   (provider comments below)

## 2020-07-23 ENCOUNTER — Ambulatory Visit (HOSPITAL_BASED_OUTPATIENT_CLINIC_OR_DEPARTMENT_OTHER): Admission: RE | Admit: 2020-07-23 | Payer: 59 | Source: Home / Self Care | Admitting: Orthopedic Surgery

## 2020-07-23 HISTORY — DX: Transient cerebral ischemic attack, unspecified: G45.9

## 2020-07-23 HISTORY — DX: Essential (primary) hypertension: I10

## 2020-07-23 SURGERY — EXCISION MASS
Anesthesia: Regional | Site: Ring Finger | Laterality: Left

## 2020-07-26 LAB — CBC
Hematocrit: 42.7 % (ref 37.5–51.0)
Hemoglobin: 15.1 g/dL (ref 13.0–17.7)
MCH: 32.8 pg (ref 26.6–33.0)
MCHC: 35.4 g/dL (ref 31.5–35.7)
MCV: 93 fL (ref 79–97)
Platelets: 206 10*3/uL (ref 150–450)
RBC: 4.61 x10E6/uL (ref 4.14–5.80)
RDW: 13 % (ref 11.6–15.4)
WBC: 5.9 10*3/uL (ref 3.4–10.8)

## 2020-07-26 LAB — COMPREHENSIVE METABOLIC PANEL
ALT: 32 IU/L (ref 0–44)
AST: 26 IU/L (ref 0–40)
Albumin/Globulin Ratio: 1.9 (ref 1.2–2.2)
Albumin: 4.7 g/dL (ref 3.8–4.8)
Alkaline Phosphatase: 77 IU/L (ref 44–121)
BUN/Creatinine Ratio: 15 (ref 10–24)
BUN: 13 mg/dL (ref 8–27)
Bilirubin Total: 1.2 mg/dL (ref 0.0–1.2)
CO2: 22 mmol/L (ref 20–29)
Calcium: 9.5 mg/dL (ref 8.6–10.2)
Chloride: 100 mmol/L (ref 96–106)
Creatinine, Ser: 0.87 mg/dL (ref 0.76–1.27)
GFR calc Af Amer: 105 mL/min/{1.73_m2} (ref >59)
GFR calc non Af Amer: 91 mL/min/{1.73_m2} (ref >59)
Globulin, Total: 2.5 g/dL (ref 1.5–4.5)
Glucose: 95 mg/dL (ref 65–99)
Potassium: 4.3 mmol/L (ref 3.5–5.2)
Sodium: 138 mmol/L (ref 134–144)
Total Protein: 7.2 g/dL (ref 6.0–8.5)

## 2020-07-26 LAB — LIPID PANEL
Chol/HDL Ratio: 2.5 ratio (ref 0.0–5.0)
Cholesterol, Total: 136 mg/dL (ref 100–199)
HDL: 55 mg/dL (ref >39)
LDL Chol Calc (NIH): 65 mg/dL (ref 0–99)
Triglycerides: 82 mg/dL (ref 0–149)
VLDL Cholesterol Cal: 16 mg/dL (ref 5–40)

## 2020-07-26 LAB — PSA: Prostate Specific Ag, Serum: 0.6 ng/mL (ref 0.0–4.0)

## 2020-07-26 LAB — TSH: TSH: 1.1 u[IU]/mL (ref 0.450–4.500)

## 2020-08-01 ENCOUNTER — Encounter: Payer: Self-pay | Admitting: Cardiovascular Disease

## 2020-08-01 ENCOUNTER — Ambulatory Visit: Payer: 59 | Admitting: Cardiovascular Disease

## 2020-08-01 ENCOUNTER — Other Ambulatory Visit: Payer: Self-pay

## 2020-08-01 DIAGNOSIS — E785 Hyperlipidemia, unspecified: Secondary | ICD-10-CM

## 2020-08-01 DIAGNOSIS — I10 Essential (primary) hypertension: Secondary | ICD-10-CM | POA: Diagnosis not present

## 2020-08-01 DIAGNOSIS — Z01818 Encounter for other preprocedural examination: Secondary | ICD-10-CM | POA: Diagnosis not present

## 2020-08-01 DIAGNOSIS — N419 Inflammatory disease of prostate, unspecified: Secondary | ICD-10-CM

## 2020-08-01 DIAGNOSIS — Z0389 Encounter for observation for other suspected diseases and conditions ruled out: Secondary | ICD-10-CM

## 2020-08-01 DIAGNOSIS — I2510 Atherosclerotic heart disease of native coronary artery without angina pectoris: Secondary | ICD-10-CM

## 2020-08-01 NOTE — Progress Notes (Signed)
Patient ID: [name] [name], male   DOB: 1956-05-02, 65 y.o.   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     [name] in the past has been followed by Dr. Dannielle Burn and later Dr. Mare Ferrari.  I first saw Mr Heinlen on 10/27/2012 when he presented to establish cardiology care with me and expressed his interest in aggressive preventive cardiology assessment. He has a strong family history for heart disease in both parents as well as grandparents. He has a history of hypertension and hyperlipidemia.  Remotely when followed by Dr. Mare Ferrari he had undergone several myocardial perfusion studies.  In 2006 and 2007 he also underwent several [facility] heart lab evaluations. In September 2006 his cholesterol was 188, LDL 90, HDL 40 but his triglycerides were 292. He is LDL III a + b. percent was significantly increased at 50.5. HDL2b  percent was low at 11. He has been on statin plus zetia for lipid lowering therapy. In 2008 he began to be followed by Dr. Lesle Reek. A chest CT revealed a calcium score 194. He did have mild coronary obstructive disease with less than 50% calcific lesion noted in the mid LAD and less than 30% calcific disease in the proximal and mid RCA.  Subsequently, he has undergone an exercise stress echo study in November 2011 which was normal. The patient has remained asymptomatic and most of his studies were screening evaluations for CAD. Since Dr. Lesle Reek has left his practice in [place], [name] has selected me to pursue his aggressive preventive cardiology evaluation.  Mr. Kamphaus remains very physically active. He exercises regularly and works out with a trainer at least 2 days per week. This past year he skied both at [place], [place] and [place], [place] without cardiovascular problems. He also does a ladder protocol on the elliptical trainer he has a 7 handicap in golf. He was seen in the emergency room at  [facility] for some symptoms of chest sensation in October 2013.   When I initially saw him, I recommended a complete set of laboratory including an NMR lipoprotein of with lipid panel. In addition I also recommended that he undergo a cardiopulmonary met test to assess for not only macrovascular angina but potential microvascular ischemia or endothelial dysfunction findings. Cardiopulmonary met test was done on 11/21/2012. This revealed a slightly reduced peak maximum oxygen consumption 80% of predicted he did have a low peak stroke volume. His anaerobic threshold was in the normal range at 13.7 mL's per kilogram per minute. Abnormal pulmonary status. Laboratory revealed a fasting glucose of 105. LDL cholesterol is excellent at 61 with an LDL particle number 01/13/1996 however, his triglycerides were still elevated at 325 and increased large VLDL of 9.5 His LDL particles were 631. Had a normal HDL of 46 and normal HDL particle number 39.4. His insulin resistance score was increased at 57.  On 03/28/2013, laboratory revealed a fasting glucose of 102. Bilirubin was 1.3. He had normal SGOT and SGPT. LDL particle number was excellent at 824, LDL 63, triglycerides were markedly improved but still minimally elevated at 169. Large LDL was also mildly elevated at 6.4 as was small LDL particle number at 654. HDL cholesterol was 47. Insulin resistance score was somewhat improved but again still mildly elevated at 52.  Since I last saw him in May 2015, he has continued to be fairly active.  However, recently he  admits to experiencing 4-5 episodes of a chest pressure sensation.  This was not always precipitated by activity.  He had gone skiing and was able to ski but did note some sharp twinges of chest discomfort.  Because of his significant family history for premature coronary artery disease and with significant disease in all 4 grandparents, father and mother, he was concerned about these episodes of chest  pain and presents for evaluation.  A follow-up NMR lipoprotein on 07/05/2014 showed a total cholesterol of 187, triglycerides were mildly elevated at 173.  HDL cholesterol is excellent at 59 and LDL cholesterol was 93.  He had 566 small LDL particles in his LDL particle number was 1211.  Insulin resistance score was slightly elevated at 59, which had increased from 46 9 months previously.  In early 2016 he had complained of some very mild episodes of vague chest sensation.  Remotely, a chest CT revealed a calcium score 194 and he had mild coronary obstructive disease.  With his  chest pain development I recommended that he undergo an exercise Myoview study.  This was done on 07/24/2014 and revealed normal perfusion without scar or ischemia.  Post stress ejection fraction was 64%.  He uunderwent follow-up lipid panel on 10/30/2014.  This was improved with a total cholesterol 151, triglycerides 152, HDL 51, LDL 70.  When I saw him last year one year ago he was exercising anywhere from 5-7 days per week , typically does cardio alternating with weights.  He had markedly reduced his intake.  At least one meal per day as a salad with protein.  I last saw him in November 2018 at which time he stated that he was doing well and remained stable and active.   He denied any episodes of chest pressure or palpitations.  He stated that his blood pressure was stable on  amlodipine/benazepril 5/10 , and atenolol 25 mg daily. Laboratory showed a fasting glucose of 104.  His CBC was normal. Lipid studies were improved with a total cholesterol 146, triglycerides 97, HDL 61, VLDL 19, and LDL cholesterol 66.  He is on atorvastatin 40 mg and Zetia 10 mg in addition to omega-3 fatty acid.  TSH was normal and there was  minimal increase in ALT at 47.  He has significantly adjusted his diet which resulted in marked improvement in his previous significant triglyceride elevation.  In that evaluation I also discussed the reduce it trial  with the CPAP.  He also was on Jalyn for prostate issues.  I  saw him in December 2019. Over the prior year, he had continued to do well; however,  he had developed a significant episode of low back discomfort and during this time he could not exercise for approximately 4 weeks.  Subsequently, he  resumed his working out with a trainer on Mondays and Thursdays and does cardiac work at least 3 days/week with an elliptical at home. He eats salads 4 days/week typically with fish for protein. He ate more over the Thanksgiving break including foods with more sodium.  He states his blood pressure typically has been running in the  115 to -128 range with diastolics 02-54.  I saw him, his blood pressure was elevated and the night prior to seeing him he had eaten pizza with sausage and Canadian bacon.  At that time, I again reviewed the new hypertensive guidelines and suggested further titration of his benazepril to 20 mg since he had been on amlodipine/benazepril combination of 5/10.  Lipid  studies had slightly increased with an LDL up to 88.  Since I saw him, he apparently was evaluated by Raquel her pharmacist in the office setting and on telephone.  His medications were adjusted and he now is on an increased dose of amlodipine at 10 mg and is no longer on ACE inhibition.  He continues to be on atenolol 25 mg tablet for which he takes a quarter of a tablet daily.  He is on atorvastatin at 40 mg and apparently has been cutting his Zetia 10 mg pill in half to 5 mg.  He continues to be on dutasteride and tamsulosin for prostate issues.  When I saw him in June 2020,  he was continuing to be active.  He was  working with a Clinical research associate 2 days/week at a trainers house since gyms are closed and on Tuesday Friday and Saturdays he does elliptical work.  He often plays golf.  He denied chest pain,PND, orthopnea or palpitations.   I last saw him in January 2021.  At that time he was more cognizant of sodium in his diet.  Presently,  he is on amlodipine 10 mg daily, atenolol 6.25 mg.  At his last office visit, he was breaking his Zetia in half and I recommended resuming 10 mg daily.  He continued to be on atorvastatin one half of an 80 mg tablet.  He continues to be on Jalyn for prostate issues.  He also takes over-the-counter omega-3 fatty acid.  He had undergone repeat laboratory on June 12, 2019 which was excellent.  Specifically, total cholesterol was improved at 134, LDL 63 triglycerides 91 HDL 54.  Chemistry CBC PSA and TSH were all normal.    Over the past year, he has continued to do well.  He underwent an echo Doppler study in May 2021 which showed an EF of 60 to 65%, mild concentric LVH and grade 1 diastolic dysfunction.  He had normal pulmonary pressures.  There was very mild mitral regurgitation and aortic insufficiency.  He states his blood pressure readings at home are around 120/70 with pulse in the mid 50s.  He sees Dr. Nancy Fetter of [facility] and Dr. Idamae Schuller of urology.  He apparently was diagnosed with a cyst on his left ring finger which may need excision.  Apparently it was advised that he see me prior to his evaluation since I had not seen him in over a year and he presents for evaluation.  He continues to be stable without chest pain PND orthopnea.  He exercises regularly.   Past Medical History:  Diagnosis Date  . Back problem   . Coronary artery disease (CAD) excluded    status post cardiac CT scan with only mild plaquing and no obstructive lesions 2006status post stress echo, ejection fraction 55% able to achieved 10  METS  . Dyslipidemia   . History of MRSA infection   . Hypertension   . Hypertriglyceridemia   . 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   Left Lower Flank  . TIA (transient ischemic attack)     Past Surgical History:  Procedure Laterality Date  . BACK SURGERY     C-6-7 cervical    Allergies  Allergen Reactions  . Bactroban [Mupirocin]   . Penicillins     Current  Outpatient Medications  Medication Sig Dispense Refill  . amLODipine-benazepril (LOTREL) 5-10 MG capsule TAKE ONE CAPSULE BY MOUTH DAILY 90 capsule 3  . aspirin 81 MG EC tablet Take 81 mg by mouth daily.      Marland Kitchen  atenolol (TENORMIN) 25 MG tablet TAKE 1/2 TABLET BY MOUTH EVERY DAY (Patient taking differently: 6.25 mg daily.) 45 tablet 3  . atorvastatin (LIPITOR) 80 MG tablet TAKE 1 TABLET BY MOUTH EVERY DAY (Patient taking differently: 40 mg.) 90 tablet 3  . Coenzyme Q10 (COQ10 PO) Take by mouth.    . dutasteride (AVODART) 0.5 MG capsule Take 0.5 mg by mouth daily.    Marland Kitchen ezetimibe (ZETIA) 10 MG tablet TAKE 1 TABLET BY MOUTH EVERY DAY 90 tablet 3  . Multiple Vitamin (MULTIVITAMIN) capsule Take 1 capsule by mouth daily.      . Omega-3 1400 MG CAPS Take 2 capsules by mouth 2 (two) times daily.     . tamsulosin (FLOMAX) 0.4 MG CAPS capsule Take 0.4 mg by mouth daily.     No current facility-administered medications for this visit.   Facility-Administered Medications Ordered in Other Visits  Medication Dose Route Frequency Provider Last Rate Last Admin  . sodium chloride flush (NS) 0.9 % injection 10 mL  10 mL Intravenous PRN Jaffe, Adam R, DO   20 mL at 10/16/19 1401   Socially, he is married for 28 years. He works in Psychologist, forensic. He did his undergraduate degree at [employer]. 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 There is no tobacco history. He does drink alcohol socially. He remains active with athletics.  Family History  Problem Relation Age of Onset  . Heart disease Other   . Hypertension Other   . Stroke Other   . Heart attack Father   . Hypertension Father   . Hyperlipidemia Father   . Heart attack Mother   . Hypertension Mother   . Hyperlipidemia Mother    ROS General: Negative; No fevers, chills, or night sweats; there is no change in weight HEENT: Negative; No changes in vision or hearing, sinus congestion, difficulty swallowing Pulmonary: He  is status post recent allergy and treatment with Z-Pak.; No cough, wheezing, shortness of breath, hemoptysis Cardiovascular: Negative; No chest pain, presyncope, syncope, palpatations GI: Negative; No nausea, vomiting, diarrhea, or abdominal pain GU: Negative; No dysuria, hematuria, or difficulty voiding Musculoskeletal: An episode of low back discomfort.  Remote C6-7 anterior approach neck surgery over 20 years ago by Dr. Joya Salm Hematologic/Oncology: Negative; no easy bruising, bleeding Endocrine: Negative; no heat/cold intolerance; no diabetes Neuro: Negative; no changes in balance, headaches Skin: Negative; No rashes or skin lesions Psychiatric: Negative; No behavioral problems, depression Sleep: Negative; No snoring, daytime sleepiness, hypersomnolence, bruxism, restless legs, hypnogognic hallucinations, no cataplexy Other comprehensive 14 point system review is negative.   PE BP (!) 148/80   Pulse 70   Ht '5\' 10"'  (1.778 m)   Wt 177 lb 15.7 oz (80.7 kg)   SpO2 97%   BMI 25.54 kg/m    Repeat blood pressure by me was 118/70  Wt Readings from Last 3 Encounters:  08/01/20 177 lb 15.7 oz (80.7 kg)  02/13/20 173 lb (78.5 kg)  10/06/19 174 lb 12.8 oz (79.3 kg)   General: Alert, oriented, no distress.  Skin: normal turgor, no rashes, warm and dry HEENT: Normocephalic, atraumatic. Pupils equal round and reactive to light; sclera anicteric; extraocular muscles intact;  Nose without nasal septal hypertrophy Mouth/Parynx benign; Mallinpatti scale 2 Neck: No JVD, no carotid bruits; normal carotid upstroke Lungs: clear to ausculatation and percussion; no wheezing or rales Chest wall: without tenderness to palpitation Heart: PMI not displaced, RRR, s1 s2 normal, 1/6 systolic murmur, no diastolic murmur,  no rubs, gallops, thrills, or heaves Abdomen: soft, nontender; no hepatosplenomehaly, BS+; abdominal aorta nontender and not dilated by palpation. Back: no CVA tenderness Pulses  2+ Musculoskeletal: full range of motion, normal strength, no joint deformities Extremities: no clubbing cyanosis or edema, Homan's sign negative  Neurologic: grossly nonfocal; Cranial nerves grossly wnl Psychologic: Normal mood and affect   ECG (independently read by me): NSR at 70, no ectopy, normal intervals   January 2021 ECG (independently read by me): Sinus rhythm with an isolated PAC.  Early transition.  Normal intervals.  No ST segment changes  June  2020 ECG (independently read by me): Sinus bradycardia 58; Normal intervals.Early transition.  December 2019 ECG (independently read by me): Normal sinus rhythm at 68 bpm.  LVH by voltage criteria.  Normal intervals.  No ectopy.  November 2018 ECG (independently read by me): Normal sinus rhythm at 62 bpm.  No ectopy.  Normal intervals.  September 2017 ECG (independently read by me): Normal sinus rhythm at 64 bpm.  Borderline voltage criteria for LVH.   No significant ST changes.  Normal intervals.  September 2016 ECG (independently read by me): normal sinus rhythm at 61 bpm.  Normal intervals.  No ST segment changes.  February 2016 ECG (independently read by me): Normal sinus rhythm at 72 bpm.  No significant ST segment changes.  Normal intervals.  May 2015 ECG (independently read by me) sinus bradycardia 57 beats per minute.  No ectopy.  Normal intervals.  ECG: Normal sinus rhythm at 60 with voltage criteria for LVH.  LABS: BMP Latest Ref Rng & Units 07/26/2020 06/12/2019 12/02/2018  Glucose 65 - 99 mg/dL 95 97 101(H)  BUN 8 - 27 mg/dL '13 12 12  ' Creatinine 0.76 - 1.27 mg/dL 0.87 0.89 0.90  BUN/Creat Ratio 10 - '24 15 13 13  ' Sodium 134 - 144 mmol/L 138 141 140  Potassium 3.5 - 5.2 mmol/L 4.3 4.0 4.4  Chloride 96 - 106 mmol/L 100 102 103  CO2 20 - 29 mmol/L '22 24 23  ' Calcium 8.6 - 10.2 mg/dL 9.5 9.2 9.4   Hepatic Function Latest Ref Rng & Units 07/26/2020 06/12/2019 05/02/2018  Total Protein 6.0 - 8.5 g/dL 7.2 7.0 7.0  Albumin 3.8  - 4.8 g/dL 4.7 4.8 4.7  AST 0 - 40 IU/L '26 25 29  ' ALT 0 - 44 IU/L 32 36 43  Alk Phosphatase 44 - 121 IU/L 77 86 78  Total Bilirubin 0.0 - 1.2 mg/dL 1.2 1.1 1.2   CBC Latest Ref Rng & Units 07/26/2020 06/12/2019 05/02/2018  WBC 3.4 - 10.8 x10E3/uL 5.9 7.1 6.2  Hemoglobin 13.0 - 17.7 g/dL 15.1 15.0 15.2  Hematocrit 37.5 - 51.0 % 42.7 42.0 45.1  Platelets 150 - 450 x10E3/uL 206 224 219   Lab Results  Component Value Date   MCV 93 07/26/2020   MCV 91 06/12/2019   MCV 90 05/02/2018   Lab Results  Component Value Date   TSH 1.100 07/26/2020    No results found for: HGBA1C   Lipid Panel     Component Value Date/Time   CHOL 136 07/26/2020 0932   CHOL 187 07/05/2014 1218   TRIG 82 07/26/2020 0932   TRIG 173 (H) 07/05/2014 1218   HDL 55 07/26/2020 0932   HDL 59 07/05/2014 1218   CHOLHDL 2.5 07/26/2020 0932   CHOLHDL 2.7 02/07/2016 1504   VLDL 36 (H) 02/07/2016 1504   LDLCALC 65 07/26/2020 0932   LDLCALC 93 07/05/2014 1218  IMPRESSION:  1. Preoperative clearance   2. Essential hypertension   3. Hyperlipidemia with target LDL less than 70   4. Coronary artery calcification seen on CT scan   5. Prostatitis, unspecified prostatitis type     ASSESSMENT AND PLAN:  Mr. Feldner has is a 65 year-old active gentleman who was found to have mild coronary calcification and nonobstructive stenoses demonstrated on a prior chest CT in 2008.  He has had normal myocardial perfusion studies and stress echo studies in the past. His last nuclear study in February 2016 was unchanged and showed normal perfusion without scar or ischemia.  He has had prior blood pressure elevations on his previous dose of amlodipine/benazepril 5/10 and currently his blood pressure is improved on a regimen consisting of only amlodipine 10 mg daily in addition to an atenolol 6.25 mg daily.  He continues to do exceptionally well and denies any chest pain or shortness of breath.  He continues to exercise regularly.  Over the  past year he has been very cognizant with reference to sodium intake and had made significant adjustments to his diet.  His blood pressure today is excellent on his current regimen of amlodipine/benazepril 5/10 mg in addition to atenolol one quarter of a 25 mg pill or 6.25 mg daily.  He continues to be on combination therapy with atorvastatin 80 mg, Zetia 10 mg, in addition to omega-3 fatty acid for hyperlipidemia.  He is on Avodart and Flomax for urinary issues followed by Dr. Irine Seal.  He has a cyst on the left ring finger which is in need for excision to be done by Dr. Daryll Brod.  I have given him clearance to undergo this without cardiac restrictions.  If necessary aspirin can be held for the procedure.  He is followed by Dr. 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  As long as he remains stable, I will see him in 1 year for reevaluation or sooner as needed.   Troy Sine, MD, [facility] 08/03/2020 2:45 PM

## 2020-08-01 NOTE — Patient Instructions (Signed)
Medication Instructions:  Continue current medications  *If you need a refill on your cardiac medications before your next appointment, please call your pharmacy*   Lab Work: None Ordered   Testing/Procedures: None Ordered   Follow-Up: At [facility], you and your health needs are our priority.  As part of our continuing mission to provide you with exceptional heart care, we have created designated Provider Care Teams.  These Care Teams include your primary Cardiologist (physician) and Advanced Practice Providers (APPs -  Physician Assistants and Nurse Practitioners) who all work together to provide you with the care you need, when you need it.  We recommend signing up for the patient portal called "MyChart".  Sign up information is provided on this After Visit Summary.  MyChart is used to connect with patients for Virtual Visits (Telemedicine).  Patients are able to view lab/test results, encounter notes, upcoming appointments, etc.  Non-urgent messages can be sent to your provider as well.   To learn more about what you can do with MyChart, go to [email or web address].    Your next appointment:   1 year(s)  The format for your next appointment:   In Person  Provider:   You may see Donabelle Molden Kelly, MD or one of the following Advanced Practice Providers on your designated Care Team:   Hao Meng, PA-C Angela Duke, PA-C or  Krista Kroeger, PA-C     

## 2020-08-03 ENCOUNTER — Encounter: Payer: Self-pay | Admitting: Cardiovascular Disease

## 2020-08-09 ENCOUNTER — Other Ambulatory Visit: Payer: Self-pay | Admitting: Urology

## 2020-08-09 DIAGNOSIS — R361 Hematospermia: Secondary | ICD-10-CM

## 2020-08-09 DIAGNOSIS — N403 Nodular prostate with lower urinary tract symptoms: Secondary | ICD-10-CM

## 2020-08-09 DIAGNOSIS — N138 Other obstructive and reflux uropathy: Secondary | ICD-10-CM

## 2020-08-20 ENCOUNTER — Ambulatory Visit: Payer: 59 | Admitting: Dermatology

## 2020-08-20 ENCOUNTER — Other Ambulatory Visit: Payer: 59

## 2020-08-23 ENCOUNTER — Telehealth: Payer: Self-pay | Admitting: *Deleted

## 2020-08-23 NOTE — Telephone Encounter (Signed)
   [facility] Pre-operative Risk Assessment   Request for surgical clearance:  1. What type of surgery is being performed? Exc mass proximal phalanx LRF   2. When is this surgery scheduled? 09/03/20   3. What type of clearance is required (medical clearance vs. Pharmacy clearance to hold med vs. Both)? Pharmacy (medical address at [place] with Dr. Claiborne Billings)  4. Are there any medications that need to be held prior to surgery and how long? Plavix   5. Practice name and name of physician performing surgery? [facility] Dr. Daryll Brod   6. What is the office phone number? [phone]   7.   What is the office fax number? [phone] attn: Brenda  8.   Anesthesia type (None, local, MAC, general) ?    Goddess Gebbia A Iliyah Bui 08/23/2020, 11:26 AM  _________________________________________________________________   OV with Dr. Claiborne Billings 02/24-clearance provided.    Plavix not addressed.

## 2020-08-23 NOTE — Telephone Encounter (Signed)
   Primary Cardiologist: Shelva Majestic, MD  Chart reviewed as part of pre-operative protocol coverage.   It does not appear that patient is taking plavix and on my review, there is no cardiology indication for him to be on plavix.  I will route this recommendation to the requesting party via Epic fax function and remove from pre-op pool. Please call with questions.  Abigail Butts, PA-C 08/23/2020, 12:17 PM

## 2020-08-28 ENCOUNTER — Encounter (HOSPITAL_BASED_OUTPATIENT_CLINIC_OR_DEPARTMENT_OTHER): Payer: Self-pay | Admitting: Orthopedic Surgery

## 2020-08-28 ENCOUNTER — Other Ambulatory Visit: Payer: Self-pay

## 2020-08-30 ENCOUNTER — Other Ambulatory Visit (HOSPITAL_COMMUNITY)
Admission: RE | Admit: 2020-08-30 | Discharge: 2020-08-30 | Disposition: A | Discharge status: Home / Self Care | Payer: 59 | Source: Ambulatory Visit | Attending: Orthopedic Surgery | Admitting: Orthopedic Surgery

## 2020-08-30 DIAGNOSIS — Z01812 Encounter for preprocedural laboratory examination: Secondary | ICD-10-CM | POA: Insufficient documentation

## 2020-08-30 DIAGNOSIS — Z20822 Contact with and (suspected) exposure to covid-19: Secondary | ICD-10-CM | POA: Insufficient documentation

## 2020-08-30 LAB — SARS CORONAVIRUS 2 (TAT 6-24 HRS): SARS Coronavirus 2: NEGATIVE

## 2020-09-03 ENCOUNTER — Ambulatory Visit: Payer: 59 | Admitting: Neurology

## 2020-09-03 ENCOUNTER — Ambulatory Visit (HOSPITAL_BASED_OUTPATIENT_CLINIC_OR_DEPARTMENT_OTHER)
Admission: RE | Admit: 2020-09-03 | Discharge: 2020-09-03 | Disposition: A | Discharge status: Home / Self Care | Payer: 59 | Attending: Orthopedic Surgery | Admitting: Orthopedic Surgery

## 2020-09-03 ENCOUNTER — Ambulatory Visit (HOSPITAL_BASED_OUTPATIENT_CLINIC_OR_DEPARTMENT_OTHER): Payer: 59 | Admitting: Certified Registered"

## 2020-09-03 ENCOUNTER — Encounter (HOSPITAL_BASED_OUTPATIENT_CLINIC_OR_DEPARTMENT_OTHER)
Admission: RE | Disposition: A | Discharge status: Home / Self Care | Payer: Self-pay | Source: Home / Self Care | Attending: Orthopedic Surgery

## 2020-09-03 ENCOUNTER — Encounter (HOSPITAL_BASED_OUTPATIENT_CLINIC_OR_DEPARTMENT_OTHER): Payer: Self-pay | Admitting: Orthopedic Surgery

## 2020-09-03 ENCOUNTER — Other Ambulatory Visit: Payer: Self-pay

## 2020-09-03 DIAGNOSIS — Z79899 Other long term (current) drug therapy: Secondary | ICD-10-CM | POA: Diagnosis not present

## 2020-09-03 DIAGNOSIS — E785 Hyperlipidemia, unspecified: Secondary | ICD-10-CM | POA: Insufficient documentation

## 2020-09-03 DIAGNOSIS — Z8614 Personal history of Methicillin resistant Staphylococcus aureus infection: Secondary | ICD-10-CM | POA: Diagnosis not present

## 2020-09-03 DIAGNOSIS — I10 Essential (primary) hypertension: Secondary | ICD-10-CM | POA: Diagnosis not present

## 2020-09-03 DIAGNOSIS — R2232 Localized swelling, mass and lump, left upper limb: Secondary | ICD-10-CM | POA: Diagnosis present

## 2020-09-03 DIAGNOSIS — Z8249 Family history of ischemic heart disease and other diseases of the circulatory system: Secondary | ICD-10-CM | POA: Diagnosis not present

## 2020-09-03 DIAGNOSIS — E781 Pure hyperglyceridemia: Secondary | ICD-10-CM | POA: Insufficient documentation

## 2020-09-03 DIAGNOSIS — Z8673 Personal history of transient ischemic attack (TIA), and cerebral infarction without residual deficits: Secondary | ICD-10-CM | POA: Insufficient documentation

## 2020-09-03 DIAGNOSIS — Z880 Allergy status to penicillin: Secondary | ICD-10-CM | POA: Diagnosis not present

## 2020-09-03 DIAGNOSIS — Z888 Allergy status to other drugs, medicaments and biological substances status: Secondary | ICD-10-CM | POA: Diagnosis not present

## 2020-09-03 DIAGNOSIS — D2361 Other benign neoplasm of skin of right upper limb, including shoulder: Secondary | ICD-10-CM | POA: Insufficient documentation

## 2020-09-03 DIAGNOSIS — Z7982 Long term (current) use of aspirin: Secondary | ICD-10-CM | POA: Insufficient documentation

## 2020-09-03 HISTORY — PX: MASS EXCISION: SHX2000

## 2020-09-03 SURGERY — EXCISION MASS
Anesthesia: General | Site: Ring Finger | Laterality: Left

## 2020-09-03 MED ORDER — LACTATED RINGERS IV SOLN: INTRAVENOUS | Status: DC

## 2020-09-03 MED ORDER — FENTANYL CITRATE (PF) 100 MCG/2ML IJ SOLN
INTRAMUSCULAR | Status: AC
  Filled 2020-09-03: qty 2

## 2020-09-03 MED ORDER — TRAMADOL HCL 50 MG PO TABS: 50.0000 mg | ORAL_TABLET | Freq: Four times a day (QID) | ORAL | 0 refills | Status: DC | PRN

## 2020-09-03 MED ORDER — ONDANSETRON HCL 4 MG/2ML IJ SOLN
INTRAMUSCULAR | Status: DC | PRN
  Administered 2020-09-03: 4 mg via INTRAVENOUS

## 2020-09-03 MED ORDER — LIDOCAINE HCL (CARDIAC) PF 100 MG/5ML IV SOSY
PREFILLED_SYRINGE | INTRAVENOUS | Status: DC | PRN
  Administered 2020-09-03: 60 mg via INTRAVENOUS

## 2020-09-03 MED ORDER — CLINDAMYCIN PHOSPHATE 900 MG/50ML IV SOLN
INTRAVENOUS | Status: AC
  Filled 2020-09-03: qty 50

## 2020-09-03 MED ORDER — PROPOFOL 10 MG/ML IV BOLUS
INTRAVENOUS | Status: AC
  Filled 2020-09-03: qty 40

## 2020-09-03 MED ORDER — CLINDAMYCIN PHOSPHATE 900 MG/50ML IV SOLN
900.0000 mg | INTRAVENOUS | Status: AC
  Administered 2020-09-03: 900 mg via INTRAVENOUS

## 2020-09-03 MED ORDER — MIDAZOLAM HCL 2 MG/2ML IJ SOLN
INTRAMUSCULAR | Status: AC
  Filled 2020-09-03: qty 2

## 2020-09-03 MED ORDER — BUPIVACAINE HCL (PF) 0.25 % IJ SOLN
INTRAMUSCULAR | Status: DC | PRN
  Administered 2020-09-03: 8 mL

## 2020-09-03 MED ORDER — OXYCODONE HCL 5 MG/5ML PO SOLN: 5.0000 mg | Freq: Once | ORAL | Status: DC | PRN

## 2020-09-03 MED ORDER — ONDANSETRON HCL 4 MG/2ML IJ SOLN: 4.0000 mg | Freq: Four times a day (QID) | INTRAMUSCULAR | Status: DC | PRN

## 2020-09-03 MED ORDER — FENTANYL CITRATE (PF) 100 MCG/2ML IJ SOLN
INTRAMUSCULAR | Status: DC | PRN
  Administered 2020-09-03 (×2): 50 ug via INTRAVENOUS

## 2020-09-03 MED ORDER — DEXAMETHASONE SODIUM PHOSPHATE 10 MG/ML IJ SOLN
INTRAMUSCULAR | Status: DC | PRN
  Administered 2020-09-03: 5 mg via INTRAVENOUS

## 2020-09-03 MED ORDER — BUPIVACAINE HCL (PF) 0.25 % IJ SOLN
INTRAMUSCULAR | Status: AC
  Filled 2020-09-03: qty 30

## 2020-09-03 MED ORDER — FENTANYL CITRATE (PF) 100 MCG/2ML IJ SOLN: 25.0000 ug | INTRAMUSCULAR | Status: DC | PRN

## 2020-09-03 MED ORDER — OXYCODONE HCL 5 MG PO TABS: 5.0000 mg | ORAL_TABLET | Freq: Once | ORAL | Status: DC | PRN

## 2020-09-03 MED ORDER — PROPOFOL 10 MG/ML IV BOLUS
INTRAVENOUS | Status: DC | PRN
  Administered 2020-09-03: 200 mg via INTRAVENOUS

## 2020-09-03 SURGICAL SUPPLY — 44 items
APL PRP STRL LF DISP 70% ISPRP (MISCELLANEOUS) ×1
BLADE SURG 15 STRL LF DISP TIS (BLADE) ×1 IMPLANT
BLADE SURG 15 STRL SS (BLADE) ×2
BNDG CMPR 9X4 STRL LF SNTH (GAUZE/BANDAGES/DRESSINGS) ×1
BNDG COHESIVE 1X5 TAN STRL LF (GAUZE/BANDAGES/DRESSINGS) ×2 IMPLANT
BNDG COHESIVE 2X5 TAN STRL LF (GAUZE/BANDAGES/DRESSINGS) IMPLANT
BNDG COHESIVE 3X5 TAN STRL LF (GAUZE/BANDAGES/DRESSINGS) IMPLANT
BNDG ESMARK 4X9 LF (GAUZE/BANDAGES/DRESSINGS) ×2 IMPLANT
BNDG GAUZE ELAST 4 BULKY (GAUZE/BANDAGES/DRESSINGS) IMPLANT
CHLORAPREP W/TINT 26 (MISCELLANEOUS) ×2 IMPLANT
CORD BIPOLAR FORCEPS 12FT (ELECTRODE) ×2 IMPLANT
COVER BACK TABLE 60X90IN (DRAPES) ×2 IMPLANT
COVER MAYO STAND STRL (DRAPES) ×2 IMPLANT
COVER WAND RF STERILE (DRAPES) IMPLANT
CUFF TOURN SGL QUICK 18X4 (TOURNIQUET CUFF) ×2 IMPLANT
DECANTER SPIKE VIAL GLASS SM (MISCELLANEOUS) IMPLANT
DRAIN PENROSE 1/2X12 LTX STRL (WOUND CARE) IMPLANT
DRAPE EXTREMITY T 121X128X90 (DISPOSABLE) ×2 IMPLANT
DRAPE SURG 17X23 STRL (DRAPES) ×2 IMPLANT
GAUZE SPONGE 4X4 12PLY STRL (GAUZE/BANDAGES/DRESSINGS) ×2 IMPLANT
GAUZE XEROFORM 1X8 LF (GAUZE/BANDAGES/DRESSINGS) ×2 IMPLANT
GLOVE SURG ORTHO LTX SZ8 (GLOVE) ×2 IMPLANT
GLOVE SURG UNDER POLY LF SZ7 (GLOVE) ×4 IMPLANT
GLOVE SURG UNDER POLY LF SZ8.5 (GLOVE) ×2 IMPLANT
GOWN STRL REUS W/ TWL LRG LVL3 (GOWN DISPOSABLE) IMPLANT
GOWN STRL REUS W/ TWL XL LVL3 (GOWN DISPOSABLE) ×1 IMPLANT
GOWN STRL REUS W/TWL LRG LVL3 (GOWN DISPOSABLE)
GOWN STRL REUS W/TWL XL LVL3 (GOWN DISPOSABLE) ×4 IMPLANT
NDL SAFETY ECLIPSE 18X1.5 (NEEDLE) IMPLANT
NEEDLE HYPO 18GX1.5 SHARP (NEEDLE)
NEEDLE PRECISIONGLIDE 27X1.5 (NEEDLE) ×2 IMPLANT
NS IRRIG 1000ML POUR BTL (IV SOLUTION) ×2 IMPLANT
PACK BASIN DAY SURGERY FS (CUSTOM PROCEDURE TRAY) ×2 IMPLANT
PAD CAST 3X4 CTTN HI CHSV (CAST SUPPLIES) IMPLANT
PADDING CAST COTTON 3X4 STRL (CAST SUPPLIES)
SPLINT PLASTER CAST XFAST 3X15 (CAST SUPPLIES) IMPLANT
SPLINT PLASTER XTRA FASTSET 3X (CAST SUPPLIES)
STOCKINETTE 4X48 STRL (DRAPES) ×2 IMPLANT
SUT ETHILON 4 0 PS 2 18 (SUTURE) ×2 IMPLANT
SUT VIC AB 4-0 P2 18 (SUTURE) IMPLANT
SYR BULB EAR ULCER 3OZ GRN STR (SYRINGE) ×2 IMPLANT
SYR CONTROL 10ML LL (SYRINGE) ×2 IMPLANT
TOWEL GREEN STERILE FF (TOWEL DISPOSABLE) ×2 IMPLANT
UNDERPAD 30X36 HEAVY ABSORB (UNDERPADS AND DIAPERS) ×2 IMPLANT

## 2020-09-03 NOTE — H&P (Signed)
[name] [name] is an 65 y.o. male.   Chief Complaint: mass left ring finger HPI: [name] [name] is a 65 year old right-hand-dominant male who is referred by Dr. Nancy Fetter for a mass consultation on his left ring finger which has been present for a year and a half. He has no history of injury to. He is not complain of any pain or discomfort. He states it has been enlarging. He is a history of a distal radius fracture treated by Dr. Telford Nab with a cast 20 years ago. He is not planing of any discomfort with it. He has no history of diabetes thyroid problems arthritis or gout. Family history is negative for each of these also. He was referred for ultrasound. This has been done and read out by Dr. Nelia Shi as A1 by 0.5 x 0.6 hypoechoic mass indicative of either giant cell tumor or epidermal inclusion cyst.       Past Medical History:  Diagnosis Date  . Back problem   . Coronary artery disease (CAD) excluded    status post cardiac CT scan with only mild plaquing and no obstructive lesions 2006status post stress echo, ejection fraction 55% able to achieved 10  METS  . Dyslipidemia   . History of MRSA infection   . Hypertension   . Hypertriglyceridemia   . 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   Left Lower Flank  . TIA (transient ischemic attack)     Past Surgical History:  Procedure Laterality Date  . BACK SURGERY     C-6-7 cervical    Family History  Problem Relation Age of Onset  . Heart disease Other   . Hypertension Other   . Stroke Other   . Heart attack Father   . Hypertension Father   . Hyperlipidemia Father   . Heart attack Mother   . Hypertension Mother   . Hyperlipidemia Mother    Social History:  reports that he has never smoked. He has never used smokeless tobacco. He reports current alcohol use. He reports that he does not use drugs.  Allergies:  Allergies  Allergen Reactions  . Bactroban [Mupirocin]   . Penicillins     Medications Prior to Admission   Medication Sig Dispense Refill  . amLODipine-benazepril (LOTREL) 5-10 MG capsule TAKE ONE CAPSULE BY MOUTH DAILY 90 capsule 3  . aspirin 81 MG EC tablet Take 81 mg by mouth daily.      Marland Kitchen atenolol (TENORMIN) 25 MG tablet TAKE 1/2 TABLET BY MOUTH EVERY DAY (Patient taking differently: 6.25 mg daily.) 45 tablet 3  . atorvastatin (LIPITOR) 80 MG tablet TAKE 1 TABLET BY MOUTH EVERY DAY (Patient taking differently: 40 mg.) 90 tablet 3  . Coenzyme Q10 (COQ10 PO) Take by mouth.    . dutasteride (AVODART) 0.5 MG capsule Take 0.5 mg by mouth daily.    Marland Kitchen ezetimibe (ZETIA) 10 MG tablet TAKE 1 TABLET BY MOUTH EVERY DAY 90 tablet 3  . Multiple Vitamin (MULTIVITAMIN) capsule Take 1 capsule by mouth daily.      . Omega-3 1400 MG CAPS Take 2 capsules by mouth 2 (two) times daily.     . tamsulosin (FLOMAX) 0.4 MG CAPS capsule Take 0.4 mg by mouth daily.      No results found for this or any previous visit (from the past 48 hour(s)).  No results found.   Pertinent items are noted in HPI.  Blood pressure (!) 143/73, pulse (!) 59, temperature 97.6 F (36.4 C), temperature source Oral,  resp. rate 20, height 5\' 10"  (1.778 m), weight 80.6 kg, SpO2 100 %.  General appearance: alert, cooperative and appears stated age Head: Normocephalic, without obvious abnormality Neck: no JVD Resp: clear to auscultation bilaterally Cardio: regular rate and rhythm, S1, S2 normal, no murmur, click, rub or gallop GI: soft, non-tender; bowel sounds normal; no masses,  no organomegaly Extremities: mass left ring finger Pulses: 2+ and symmetric Skin: Skin color, texture, turgor normal. No rashes or lesions Neurologic: Grossly normal Incision/Wound: na  Assessment/Plan Assessment:  1. Mass of left finger    Plan: We have discussed surgical removal of this with him preperi-and postoperative course been discussed along with risk and complications. He is aware that there is no guarantee to the surgery possibility of  infection recurrence injury to arteries nerves tendons incomplete relief symptoms dystrophy. He would like to proceed and is scheduled for excision mass proximal phalanx left ring finger as an outpatient under regional anesthesia.     Daryll Brod 09/03/2020, 11:45 AM

## 2020-09-03 NOTE — Discharge Instructions (Addendum)
[facility] Instructions °Hand Surgery ° °Wound Care: °Keep your hand elevated above the level of your heart.  Do not allow it to dangle by your side.  Keep the dressing dry and do not remove it unless your doctor advises you to do so.  He will usually change it at the time of your post-op visit.  Moving your fingers is advised to stimulate circulation but will depend on the site of your surgery.  If you have a splint applied, your doctor will advise you regarding movement. ° °Activity: °Do not drive or operate machinery today.  Rest today and then you may return to your normal activity and work as indicated by your physician. ° °Diet:  °Drink liquids today or eat a light diet.  You may resume a regular diet tomorrow.   ° °General expectations: °Pain for two to three days. °Fingers may become slightly swollen. ° °Call your doctor if any of the following occur: °Severe pain not relieved by pain medication. °Elevated temperature. °Dressing soaked with blood. °Inability to move fingers. °White or bluish color to fingers. ° ° °Post Anesthesia Home Care Instructions ° °Activity: °Get plenty of rest for the remainder of the day. A responsible individual must stay with you for 24 hours following the procedure.  °For the next 24 hours, DO NOT: °-Drive a car °-Operate machinery °-Drink alcoholic beverages °-Take any medication unless instructed by your physician °-Make any legal decisions or sign important papers. ° °Meals: °Start with liquid foods such as gelatin or soup. Progress to regular foods as tolerated. Avoid greasy, spicy, heavy foods. If nausea and/or vomiting occur, drink only clear liquids until the nausea and/or vomiting subsides. Call your physician if vomiting continues. ° °Special Instructions/Symptoms: °Your throat may feel dry or sore from the anesthesia or the breathing tube placed in your throat during surgery. If this causes discomfort, gargle with warm salt water. The discomfort should disappear within  24 hours. ° °If you had a scopolamine patch placed behind your ear for the management of post- operative nausea and/or vomiting: ° °1. The medication in the patch is effective for 72 hours, after which it should be removed.  Wrap patch in a tissue and discard in the trash. Wash hands thoroughly with soap and water. °2. You may remove the patch earlier than 72 hours if you experience unpleasant side effects which may include dry mouth, dizziness or visual disturbances. °3. Avoid touching the patch. Wash your hands with soap and water after contact with the patch. °  ° °Post Anesthesia Home Care Instructions ° °Activity: °Get plenty of rest for the remainder of the day. A responsible individual must stay with you for 24 hours following the procedure.  °For the next 24 hours, DO NOT: °-Drive a car °-Operate machinery °-Drink alcoholic beverages °-Take any medication unless instructed by your physician °-Make any legal decisions or sign important papers. ° °Meals: °Start with liquid foods such as gelatin or soup. Progress to regular foods as tolerated. Avoid greasy, spicy, heavy foods. If nausea and/or vomiting occur, drink only clear liquids until the nausea and/or vomiting subsides. Call your physician if vomiting continues. ° °Special Instructions/Symptoms: °Your throat may feel dry or sore from the anesthesia or the breathing tube placed in your throat during surgery. If this causes discomfort, gargle with warm salt water. The discomfort should disappear within 24 hours. ° °If you had a scopolamine patch placed behind your ear for the management of post- operative nausea and/or vomiting: ° °  1. The medication in the patch is effective for 72 hours, after which it should be removed.  Wrap patch in a tissue and discard in the trash. Wash hands thoroughly with soap and water. °2. You may remove the patch earlier than 72 hours if you experience unpleasant side effects which may include dry mouth, dizziness or visual  disturbances. °3. Avoid touching the patch. Wash your hands with soap and water after contact with the patch. °  ° °

## 2020-09-03 NOTE — Anesthesia Preprocedure Evaluation (Signed)
Anesthesia Evaluation  Patient identified by MRN, date of birth, ID band Patient awake    Reviewed: Allergy & Precautions, H&P , NPO status , Patient's Chart, lab work & pertinent test results  Airway Mallampati: II   Neck ROM: full    Dental   Pulmonary asthma ,    breath sounds clear to auscultation       Cardiovascular hypertension,  Rhythm:regular Rate:Normal  status post cardiac CT scan with mild plaquing and no obstructive lesions 2006. status post stress echo, ejection fraction 55% able to achieved 10  METS   Neuro/Psych TIA   GI/Hepatic   Endo/Other    Renal/GU      Musculoskeletal   Abdominal   Peds  Hematology   Anesthesia Other Findings   Reproductive/Obstetrics                             Anesthesia Physical Anesthesia Plan  ASA: II  Anesthesia Plan: General   Post-op Pain Management:    Induction: Intravenous  PONV Risk Score and Plan: 2 and Ondansetron, Dexamethasone, Midazolam and Treatment may vary due to age or medical condition  Airway Management Planned: LMA  Additional Equipment:   Intra-op Plan:   Post-operative Plan: Extubation in OR  Informed Consent: I have reviewed the patients History and Physical, chart, labs and discussed the procedure including the risks, benefits and alternatives for the proposed anesthesia with the patient or authorized representative who has indicated his/her understanding and acceptance.     Dental advisory given  Plan Discussed with: CRNA, Anesthesiologist and Surgeon  Anesthesia Plan Comments:         Anesthesia Quick Evaluation

## 2020-09-03 NOTE — Transfer of Care (Signed)
Immediate Anesthesia Transfer of Care Note  Patient: [name] [name]  Procedure(s) Performed: EXCISION MASS PROXIMAL PHALANX LEFT RING FINGER (Left Ring Finger)  Patient Location: PACU  Anesthesia Type:GA combined with regional for post-op pain  Level of Consciousness: awake, alert , oriented, drowsy and patient cooperative  Airway & Oxygen Therapy: Patient Spontanous Breathing and Patient connected to face mask oxygen  Post-op Assessment: Report given to RN and Post -op Vital signs reviewed and stable  Post vital signs: Reviewed and stable  Last Vitals:  Vitals Value Taken Time  BP    Temp    Pulse 58 09/03/20 1239  Resp 10 09/03/20 1239  SpO2 99 % 09/03/20 1239  Vitals shown include unvalidated device data.  Last Pain:  Vitals:   09/03/20 1100  TempSrc: Oral  PainSc: 0-No pain         Complications: No complications documented.

## 2020-09-03 NOTE — Op Note (Signed)
NAME: [name] [name] MEDICAL RECORD NO: [id number] DATE OF BIRTH: 08-08-1955 FACILITY: [facility] LOCATION: [facility] PHYSICIAN: Wynonia Sours, MD   OPERATIVE REPORT   DATE OF PROCEDURE: 09/03/20    PREOPERATIVE DIAGNOSIS:   Mass left ring finger   POSTOPERATIVE DIAGNOSIS:   Same   PROCEDURE:   Excision mass left ring finger 0.6 x 0.4 cm   SURGEON: Daryll Brod, M.D.   ASSISTANT: none   ANESTHESIA:  General and Local   INTRAVENOUS FLUIDS:  Per anesthesia flow sheet.   ESTIMATED BLOOD LOSS:  Minimal.   COMPLICATIONS:  None.   SPECIMENS:   Mass   TOURNIQUET TIME:    Total Tourniquet Time Documented: Upper Arm (Left) - 13 minutes Total: Upper Arm (Left) - 13 minutes    DISPOSITION:  Stable to PACU.   INDICATIONS: Patient is a 65 year old male with a mass on the proximal phalanx radial aspect left ring finger.  He is desires having this excised.  Ultrasound reveals a solid mass and he could have either giant cell tumor or epidermal inclusion cyst.  Pre-peripostoperative course been discussed along with risk complications.  He is aware there is no guarantee to the surgery the possibility of infection recurrence injury to arteries nerves tendons incomplete relief symptoms dystrophy.  Preoperative area the patient is seen the extremity marked by both patient and surgeon antibiotic given  OPERATIVE COURSE: Patient is brought to the operating room where general anesthetic was carried out without difficulty under the direction of the anesthesia department.  He was prepped using ChloraPrep in the supine position with the left arm free.  3-minute dry time was allowed and timeout taken confirming patient and procedure.  The limb was exsanguinated with an Esmarch bandage turn placed on the arm was inflated to 250 mmHg a volar Bruner incision was made carried down through subcutaneous tissue.  This was base just proximal to the distal metacarpal phalangeal joint crease and then  carried obliquely across the proximal phalanx tissue was then elevated with blunt dissection.  A well-circumscribed tannish-brown lobulated mass was immediately encountered.  This was freely movable.  This was easily dissected free after visualization neurovascular bundles radially and ulnarly which were not compromised.  The specimen was sent to pathology.  The wound was copious irrigated with saline.  The skin was closed with interrupted 4-0 nylon sutures.  A metacarpal block was then given with quarter percent bupivacaine with out epinephrine approximately 7 cc was used.  A sterile compressive dressing to the ring and small finger was applied.  On deflation of the tourniquet all fingers immediately pink.  He was taken to the recovery room for observation in satisfactory condition.  He will be discharged home to return to the hand center [facility] in 1 week on Tylenol ibuprofen for pain with Ultram for breakthrough.   Daryll Brod, MD Electronically signed, 09/03/20

## 2020-09-03 NOTE — Anesthesia Procedure Notes (Signed)
Procedure Name: LMA Insertion Date/Time: 09/03/2020 12:04 PM Performed by: Willa Frater, CRNA Pre-anesthesia Checklist: Patient identified, Emergency Drugs available, Suction available and Patient being monitored Patient Re-evaluated:Patient Re-evaluated prior to induction Oxygen Delivery Method: Circle system utilized Preoxygenation: Pre-oxygenation with 100% oxygen Induction Type: IV induction Ventilation: Mask ventilation without difficulty LMA: LMA inserted LMA Size: 4.0 Number of attempts: 1 Airway Equipment and Method: Bite block Placement Confirmation: positive ETCO2 Tube secured with: Tape Dental Injury: Teeth and Oropharynx as per pre-operative assessment

## 2020-09-03 NOTE — Brief Op Note (Signed)
09/03/2020  12:30 PM  PATIENT:  [name] [name]  65 y.o. male  PRE-OPERATIVE DIAGNOSIS:  MASS P-1 LEFT RING FINGER  POST-OPERATIVE DIAGNOSIS:  MASS P-1 LEFT RING FINGER  PROCEDURE:  Procedure(s): EXCISION MASS PROXIMAL PHALANX LEFT RING FINGER (Left)  SURGEON:  Surgeon(s) and Role:    Daryll Brod, MD - Primary  PHYSICIAN ASSISTANT:   ASSISTANTS: none   ANESTHESIA:   local and general  EBL: 46ml  BLOOD ADMINISTERED:none  DRAINS: none   LOCAL MEDICATIONS USED:  BUPIVICAINE   SPECIMEN:  Excision  DISPOSITION OF SPECIMEN:  PATHOLOGY  COUNTS:  YES  TOURNIQUET:   Total Tourniquet Time Documented: Upper Arm (Left) - 13 minutes Total: Upper Arm (Left) - 13 minutes   DICTATION: .Viviann Spare Dictation  PLAN OF CARE: Discharge to home after PACU  PATIENT DISPOSITION:  PACU - hemodynamically stable.

## 2020-09-04 ENCOUNTER — Encounter (HOSPITAL_BASED_OUTPATIENT_CLINIC_OR_DEPARTMENT_OTHER): Payer: Self-pay | Admitting: Orthopedic Surgery

## 2020-09-04 LAB — SURGICAL PATHOLOGY

## 2020-09-04 NOTE — Anesthesia Postprocedure Evaluation (Signed)
Anesthesia Post Note  Patient: [name] [name]  Procedure(s) Performed: EXCISION MASS PROXIMAL PHALANX LEFT RING FINGER (Left Ring Finger)     Patient location during evaluation: PACU Anesthesia Type: General Level of consciousness: awake and alert Pain management: pain level controlled Vital Signs Assessment: post-procedure vital signs reviewed and stable Respiratory status: spontaneous breathing, nonlabored ventilation, respiratory function stable and patient connected to nasal cannula oxygen Cardiovascular status: blood pressure returned to baseline and stable Postop Assessment: no apparent nausea or vomiting Anesthetic complications: no   No complications documented.  Last Vitals:  Vitals:   09/03/20 1300 09/03/20 1315  BP: 123/70 123/73  Pulse: (!) 58 64  Resp: 12 16  Temp:  36.7 C  SpO2: 100% 100%    Last Pain:  Vitals:   09/03/20 1315  TempSrc:   PainSc: 0-No pain                 Layne Dilauro S

## 2020-09-25 ENCOUNTER — Encounter: Payer: Self-pay | Admitting: Dermatology

## 2020-09-25 ENCOUNTER — Other Ambulatory Visit: Payer: Self-pay

## 2020-09-25 ENCOUNTER — Ambulatory Visit: Payer: 59 | Admitting: Dermatology

## 2020-09-25 DIAGNOSIS — Z1283 Encounter for screening for malignant neoplasm of skin: Secondary | ICD-10-CM | POA: Diagnosis not present

## 2020-09-25 DIAGNOSIS — D1801 Hemangioma of skin and subcutaneous tissue: Secondary | ICD-10-CM | POA: Diagnosis not present

## 2020-09-27 ENCOUNTER — Other Ambulatory Visit: Payer: Self-pay

## 2020-09-27 ENCOUNTER — Ambulatory Visit
Admission: RE | Admit: 2020-09-27 | Discharge: 2020-09-27 | Disposition: A | Discharge status: Home / Self Care | Payer: 59 | Source: Ambulatory Visit | Attending: Urology | Admitting: Urology

## 2020-09-27 ENCOUNTER — Ambulatory Visit
Admission: RE | Admit: 2020-09-27 | Discharge: 2020-09-27 | Disposition: A | Discharge status: Home / Self Care | Payer: 59 | Source: Ambulatory Visit | Attending: Neurology | Admitting: Neurology

## 2020-09-27 DIAGNOSIS — N138 Other obstructive and reflux uropathy: Secondary | ICD-10-CM

## 2020-09-27 DIAGNOSIS — I671 Cerebral aneurysm, nonruptured: Secondary | ICD-10-CM

## 2020-09-27 IMAGING — MR MR MRA HEAD W/O CM
1 series · 11 of 48 positions shown · non-contrast
Comparison: [DATE]

CLINICAL DATA: Follow-up cerebral aneurysm.

EXAM:
MRA HEAD WITHOUT CONTRAST
TECHNIQUE: Angiographic images of the Circle of Willis were obtained using MRA
technique without intravenous contrast.

[Series 5: tof_fl3d_tra_p2_multi-slab · axial · 0.6mm · 0.26mm/px · z∈[-40,+39]mm · 11 of 149 slices shown]
[im 10/149]
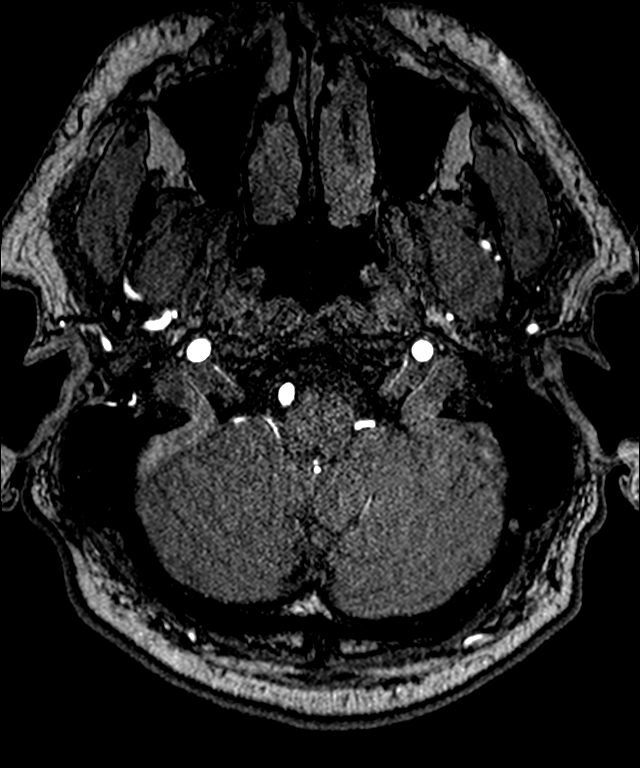
[im 26/149]
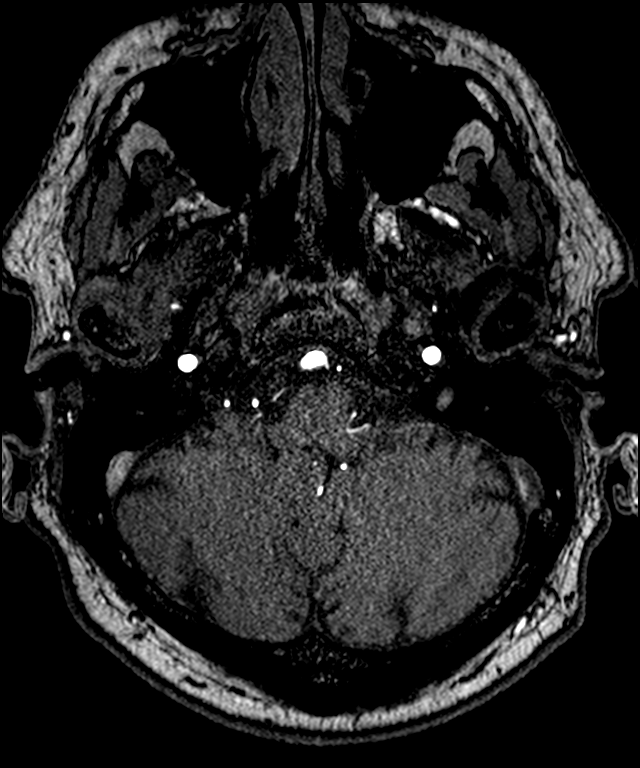
[im 29/149]
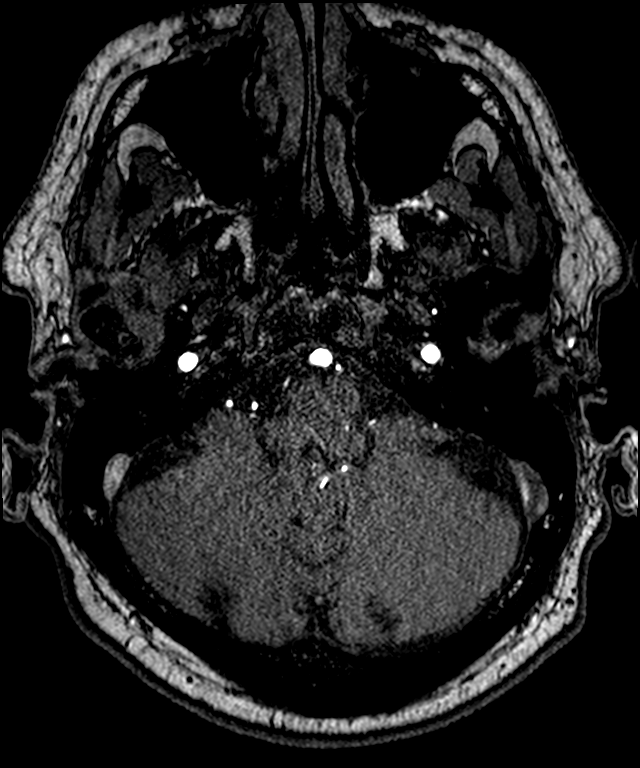
[im 48/149]
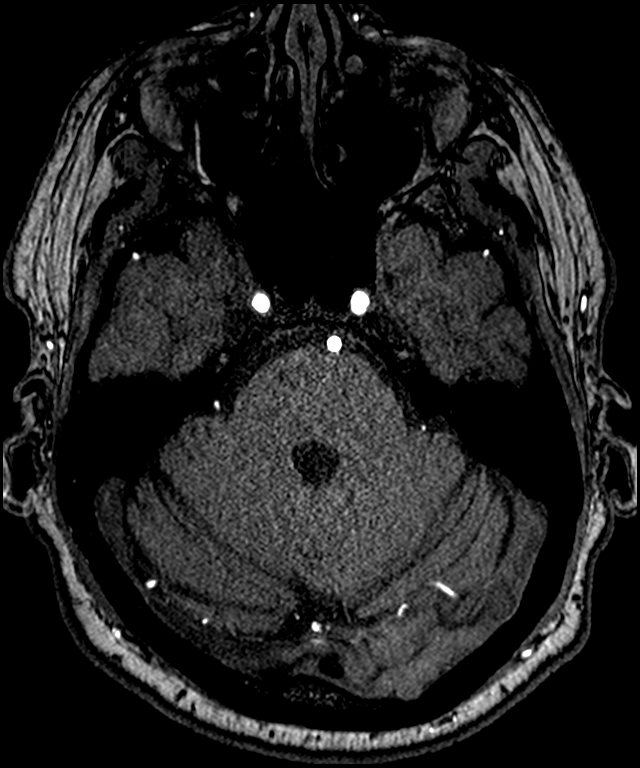
[im 67/149]
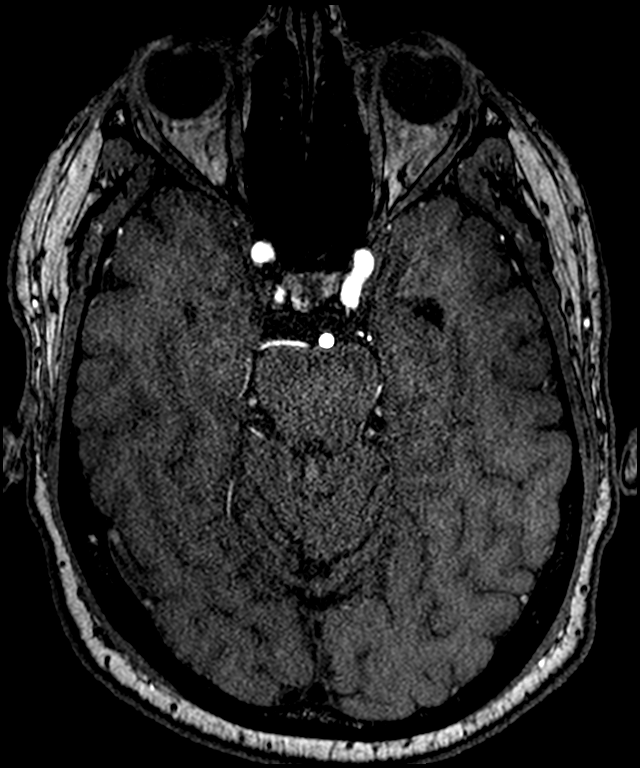
[im 76/149]
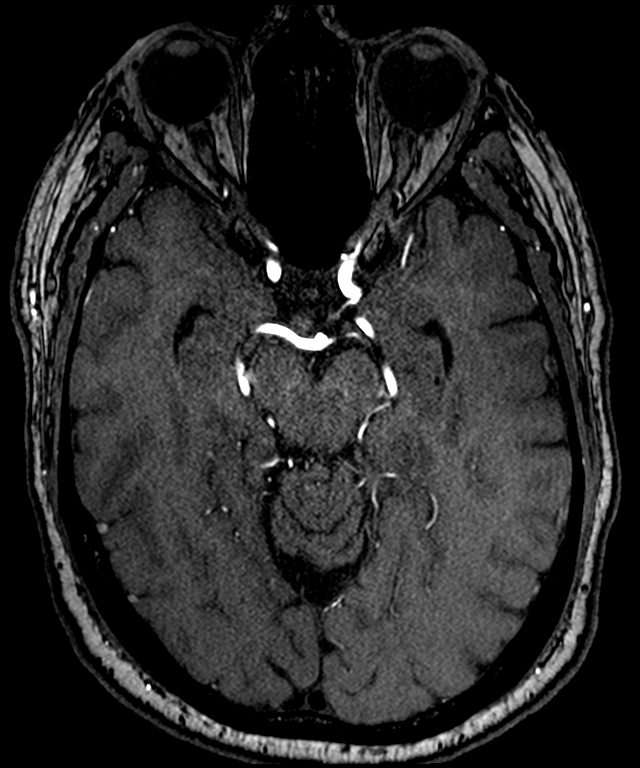
[im 86/149]
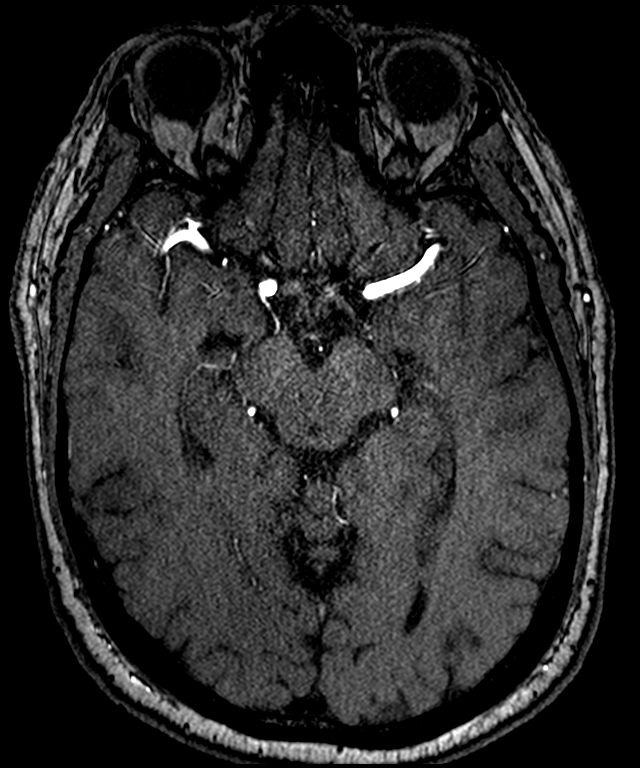
[im 104/149]
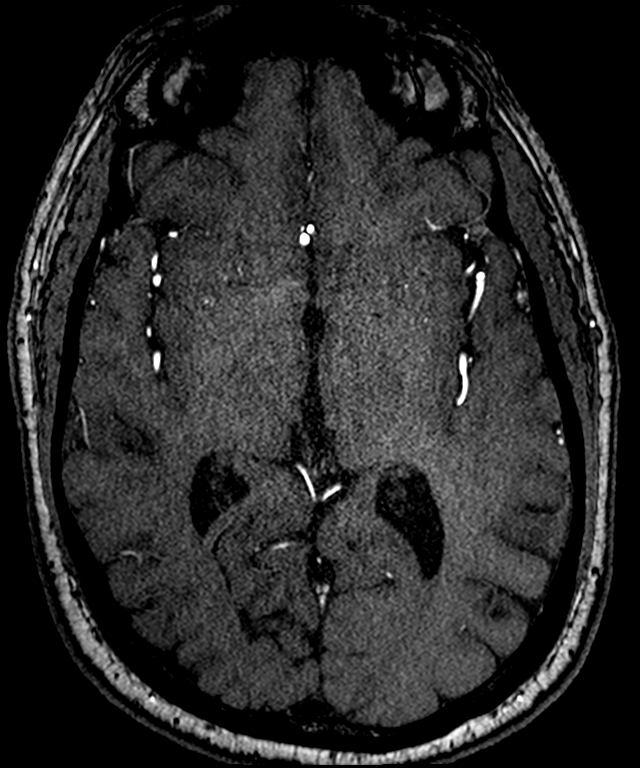
[im 123/149]
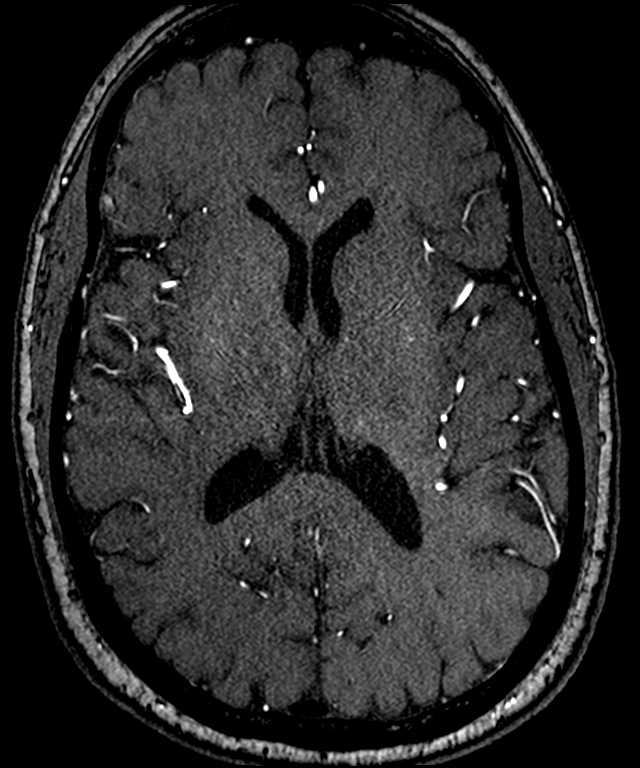
[im 126/149]
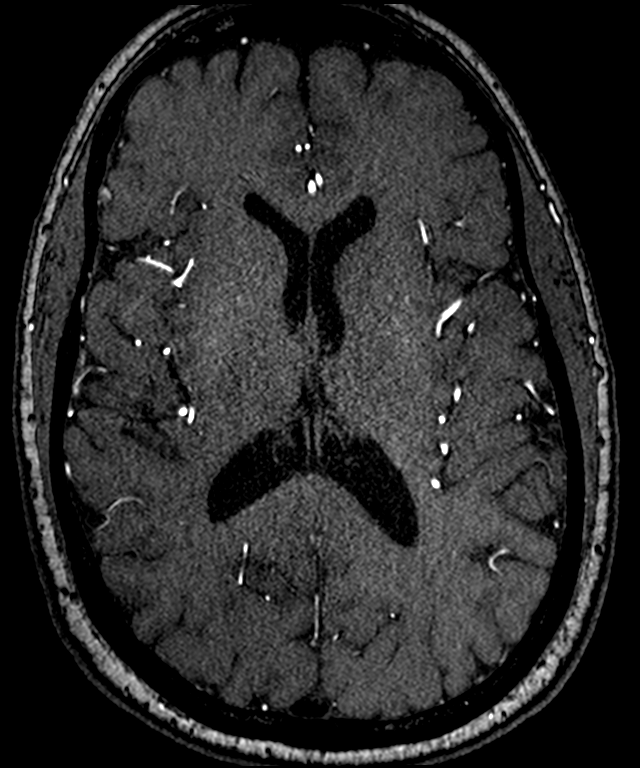
[im 142/149]
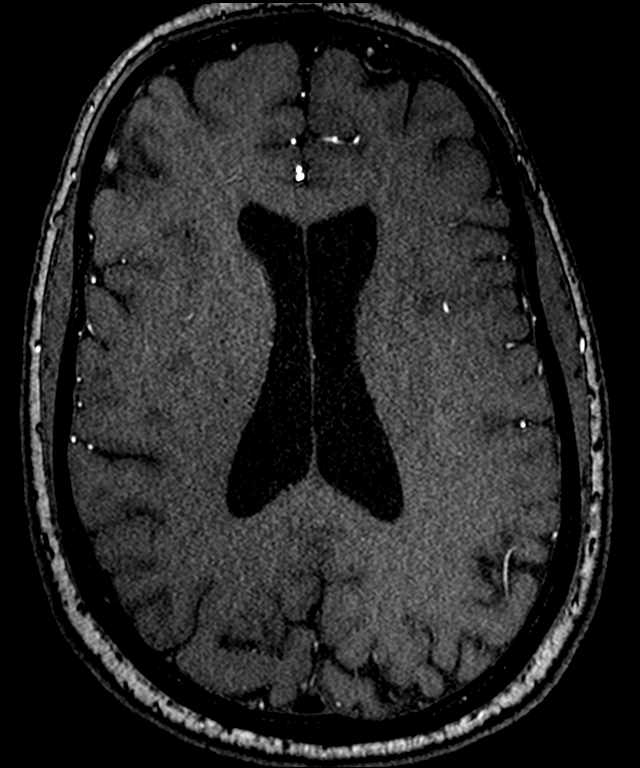

[11 of 48 positions shown; findings below may reference images not displayed]

FINDINGS: Vessels are smooth and widely patent. No branch occlusion, beading,
or flow limiting stenosis. The outpouching from the right A1 segment
described on prior is closely associated with an emanating vessel,
infundibulum is more likely. No evidence of vascular malformation.
IMPRESSION: Unchanged outpouching at the right A1 segment. With better
resolution on today's scan, infundibulum is most likely.

## 2020-09-27 IMAGING — MR MR PROSTATE WO/W CM
11 series · 48 of 48 positions shown · IV contrast (multihance)
Comparison: None.

CLINICAL DATA: Elevated PSA.  Nodular prostate on exam.

EXAM:
MR PROSTATE WITHOUT AND WITH CONTRAST
TECHNIQUE: Multiplanar multisequence MRI images were obtained of the pelvis
centered about the prostate. Pre and post contrast images were
obtained.
CONTRAST:  15mL MULTIHANCE GADOBENATE DIMEGLUMINE 529 MG/ML IV SOLN

[Series 3: T2 · coronal · 3.0mm · 0.56mm/px · 1 of 23 slices shown (1 of 2)]
[im 1/23]
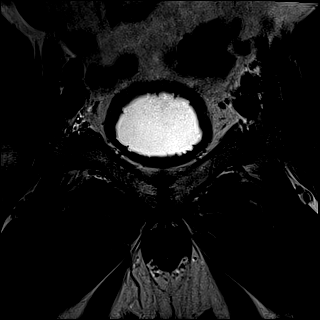

[Series 4: T1 · axial · 5.0mm · 1.25mm/px · z∈[-133,+62]mm · 2 of 80 slices shown]
[im 1/80]
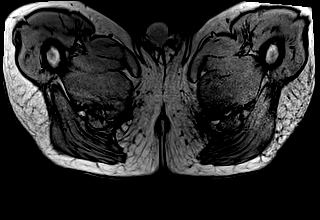
[im 80/80]
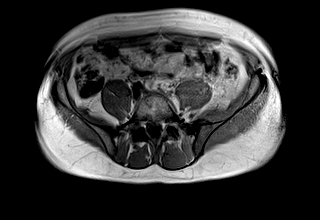

[Series 5: DWI · axial · 3.0mm · 1.75mm/px · z∈[-105,-33]mm · 2 of 75 slices shown (1 of 3)]
[im 1/75]
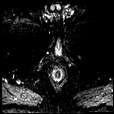
[im 75/75]
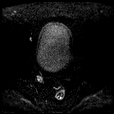

[Series 6: DWI · axial · 3.0mm · 1.75mm/px · 1 of 25 slices shown (2 of 3)]
[im 1/25]
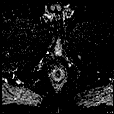

[Series 7: DWI · axial · 3.0mm · 1.75mm/px · 1 of 25 slices shown (3 of 3)]
[im 1/25]
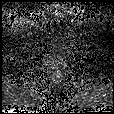

[Series 9: T2 · axial · 1.0mm · 1.04mm/px · z∈[-109,-30]mm · 2 of 80 slices shown (2 of 2)]
[im 1/80]
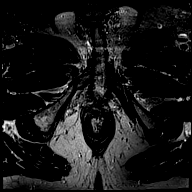
[im 80/80]
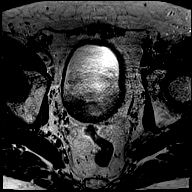

[Series 10: pre t1_twist_tra_dyn · axial · non-contrast · 3.5mm · 0.83mm/px · 1 of 20 slices shown]
[im 1/20]
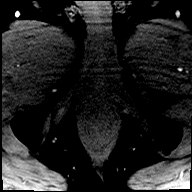

[Series 11: post t1_twist_tra_dyn-copy center · axial · non-contrast · 3.5mm · 0.83mm/px · z∈[-102,-36]mm · 17 of 600 slices shown]
[im 1/600]
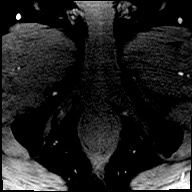
[im 38/600]
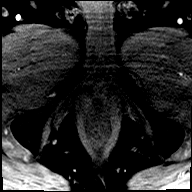
[im 75/600]
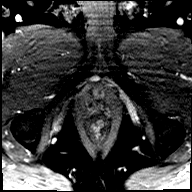
[im 113/600]
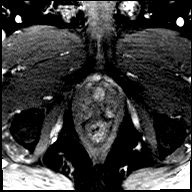
[im 150/600]
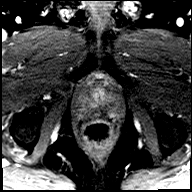
[im 188/600]
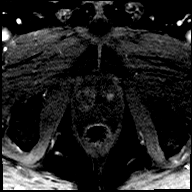
[im 225/600]
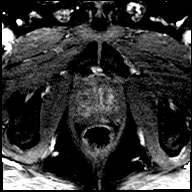
[im 263/600]
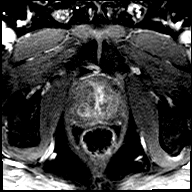
[im 300/600]
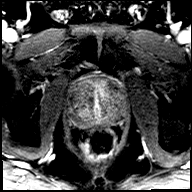
[im 337/600]
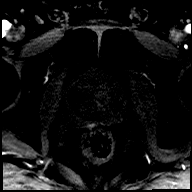
[im 375/600]
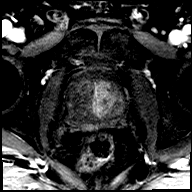
[im 412/600]
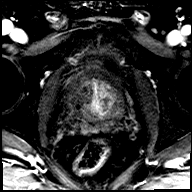
[im 450/600]
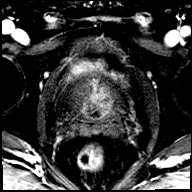
[im 487/600]
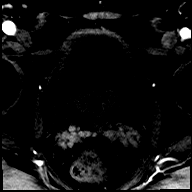
[im 525/600]
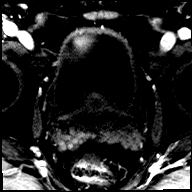
[im 562/600]
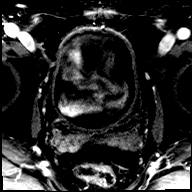
[im 600/600]
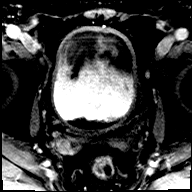

[Series 12: post t1_twist_tra_dyn-copy cent_sub · axial · 3.5mm · 0.83mm/px · z∈[-102,-36]mm · 17 of 580 slices shown]
[im 1/580]
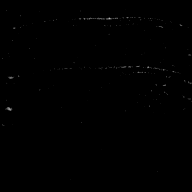
[im 37/580]
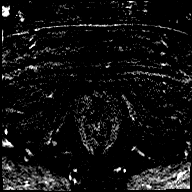
[im 73/580]
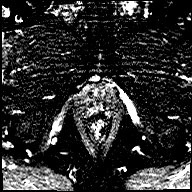
[im 109/580]
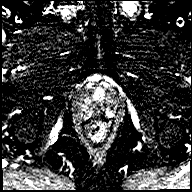
[im 145/580]
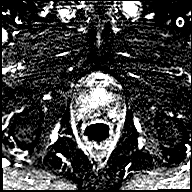
[im 181/580]
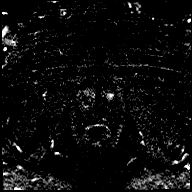
[im 218/580]
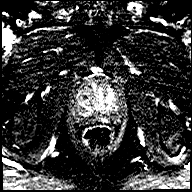
[im 254/580]
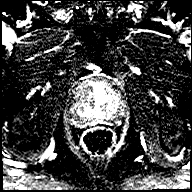
[im 290/580]
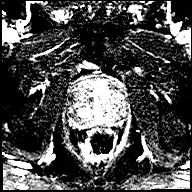
[im 326/580]
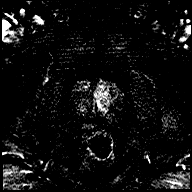
[im 362/580]
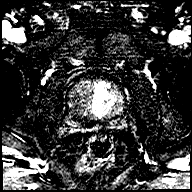
[im 399/580]
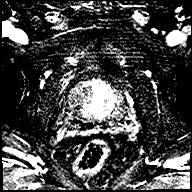
[im 435/580]
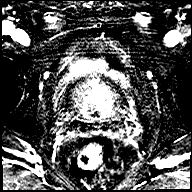
[im 471/580]
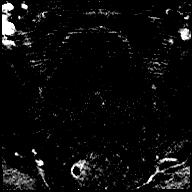
[im 507/580]
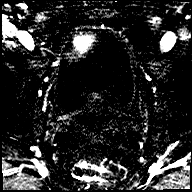
[im 543/580]
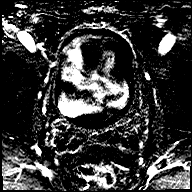
[im 580/580]
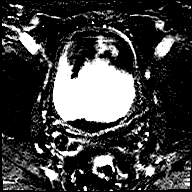

[Series 13: t1_vibe_dixon_tra_f · axial · 2.5mm · 0.91mm/px · z∈[-134,+63]mm · 2 of 80 slices shown]
[im 1/80]
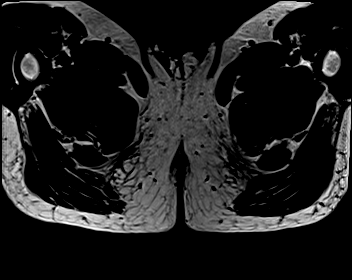
[im 80/80]
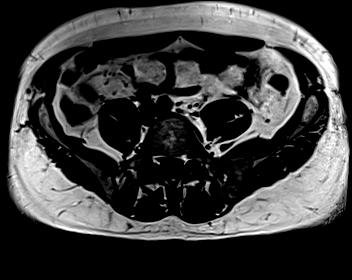

[Series 14: t1_vibe_dixon_tra_w · axial · 2.5mm · 0.91mm/px · z∈[-134,+63]mm · 2 of 80 slices shown]
[im 1/80]
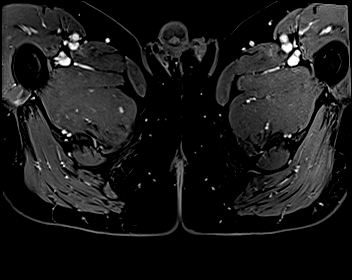
[im 80/80]
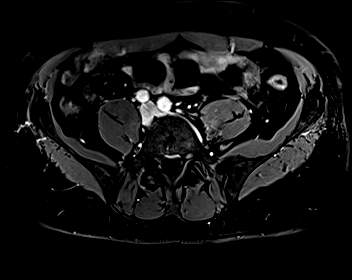

[48 of 48 positions shown; findings below may reference images not displayed]

FINDINGS: Prostate:

-- Peripheral Zone: No focal lesion seen on ADC or high b-value DWI
sequences.

-- Transition/Central Zone: 12 mm midline utricle cyst is
incidentally noted in the base. Enlarged with circumscribed BPH
nodules noted; however, no suspicious nodules with
obscured/non-circumscribed margins or restricted diffusion seen.

-- Measurements/Volume:  5.7 x 4.3 x 5.2 cm (volume = 67 cm^3)

Transcapsular spread:  Absent

Seminal vesicle involvement:  Absent

Neurovascular bundle involvement:  Absent

Pelvic adenopathy: None visualized

Bone metastasis: None visualized

Other: Mild diffuse bladder wall thickening, consistent with chronic
bladder outlet obstruction. Sigmoid diverticulosis, without evidence
of diverticulitis.
IMPRESSION: No radiographic evidence of high-grade prostate carcinoma. PI-RADS
1: Very Low (clinically significant cancer is highly unlikely to be
present)

## 2020-09-27 MED ORDER — GADOBENATE DIMEGLUMINE 529 MG/ML IV SOLN
15.0000 mL | Freq: Once | INTRAVENOUS | Status: AC | PRN
  Administered 2020-09-27: 15 mL via INTRAVENOUS

## 2020-10-02 ENCOUNTER — Telehealth: Payer: Self-pay | Admitting: Neurology

## 2020-10-02 NOTE — Progress Notes (Signed)
Pt advised of his MRA results.

## 2020-10-02 NOTE — Telephone Encounter (Signed)
Patient called in to get his MRI results

## 2020-10-02 NOTE — Telephone Encounter (Signed)
See results notes. 

## 2020-10-05 ENCOUNTER — Encounter: Payer: Self-pay | Admitting: Dermatology

## 2020-10-05 NOTE — Progress Notes (Signed)
   Follow-Up Visit   Subjective  [name] [name] is a 65 y.o. male who presents for the following: Annual Exam (Full body skin examination- no new conerns).  General skin examination Location:  Duration:  Quality:  Associated Signs/Symptoms: Modifying Factors:  Severity:  Timing: Context:   Objective  Well appearing patient in no apparent distress; mood and affect are within normal limits. Objective  Neck - Anterior: 4 mm violet colored smooth dermal papule; dermoscopy shows benign vascular lesion.  Objective  Mid Back: General skin examination, no atypical pigmented lesions or nonmelanoma skin cancer.    A full examination was performed including scalp, head, eyes, ears, nose, lips, neck, chest, axillae, abdomen, back, buttocks, bilateral upper extremities, bilateral lower extremities, hands, feet, fingers, toes, fingernails, and toenails. All findings within normal limits unless otherwise noted below.   Assessment & Plan    Cherry angioma Neck - Anterior  Leave if stable  Encounter for screening for malignant neoplasm of skin Mid Back  Annual skin examination, encouraged to self examine twice annually.  Continued ultraviolet protection.      I, Lavonna Monarch, MD, have reviewed all documentation for this visit.  The documentation on 10/05/20 for the exam, diagnosis, procedures, and orders are all accurate and complete.

## 2020-10-17 ENCOUNTER — Ambulatory Visit: Payer: 59 | Admitting: Neurology

## 2020-10-23 NOTE — Progress Notes (Signed)
NEUROLOGY FOLLOW UP OFFICE NOTE  [name] [name] [id number]  Assessment/Plan:   1.  Recurrent transient episodes (headache, confusion, dizziness, and left facial droop) in setting of elevated blood pressure. Unclear if TIA vs hypertensive urgency. Given the slight facial droop, I would be cautious and treat for possible TIA. 2.  Findings on MRA of head favor infundibulum rather than aneurysm. 3.  Hypertension  1.  Secondary stroke prevention as managed by PCP: -  Plavix 75mg  daily -  Atorvastatin (LDL goal less than 70) -  Normotensive blood pressure - follow up with PCP -  Glycemic control (Hgb A1c goal less than 7) 2.  Follow up as needed  Subjective:  [name] [name] is a 19 year oldright-handed white male with CAD, HTN, dyslipidemia and history of superficial nodular basal cell carcinoma whofollows up for transient spells.  UPDATE: Current medications:  Plavix 75mg , atorvastatin 80mg   MRA of head on 09/27/2020 personally reviewed showed that with better resolution the finding was most likely infundibulum rather than aneurysm.  HISTORY: In December 2019-January 2020,he developed a period of fluctuating blood pressure, dizziness, headache, unsteady gait, lasting2 1/2 to 3 weeks.He also noted left facial weakness.He had changes in blood pressure medications and symptoms improved. On 06/17/2019, he had the Shingles vaccine. He didn't feel well afterwards.In mid-January, he developed recurrence of symptoms but more severe. He noted 3 or 4 days ofsome memory deficits such as difficulty recalling what he done the prior day.He started waking up in the morning with a dull frontalheadachelasting about35 minutes frontal. 2-3 times a week . 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 He sometimes takes a power nap in his chair. One time, he woke up and found himself slumped over in the chair to the right. Blood pressure medications were increased. Over last week, symptoms have improved but not subsided. Still has some fluctuations in BP. Sharp shooting pain from right occipital to front 10-15 seconds maybe once a day.   B12 from 08/22/2019 was 537. He underwent MRI of brain from 08/25/2019 was unremarkable.  MRA of head and neck showed 1-2 mm infundibulum vs tiny aneurysm arising from the A1 right ACA as well as moderate atherosclerotic narrowing at origin of non-dominant vertebral artery but otherwise unremarkable.   In March 2021, he had further episodes associated with elevated blood pressures (696V systolic) with some left facial weakness. ASA was switched to Plavix.  Echocardiogram with bubble study from May showed no cardiac source of embolus.  He reports prior episodes of high blood pressure with left facial droop, but no headache or dizziness.   PAST MEDICAL HISTORY: Past Medical History:  Diagnosis Date  . Back problem   . Coronary artery disease (CAD) excluded    status post cardiac CT scan with only mild plaquing and no obstructive lesions 2006status post stress echo, ejection fraction 55% able to achieved 10  METS  . Dyslipidemia   . History of MRSA infection   . Hypertension   . Hypertriglyceridemia   . 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   Left Lower Flank  . TIA (transient ischemic attack)     MEDICATIONS: Current Outpatient Medications on File Prior to Visit  Medication Sig Dispense Refill  . amLODipine-benazepril (LOTREL) 5-10 MG capsule TAKE ONE CAPSULE BY MOUTH DAILY  90 capsule 3  . aspirin 81 MG EC tablet Take 81 mg by mouth daily.      Marland Kitchen atenolol (TENORMIN) 25 MG tablet TAKE 1/2 TABLET BY MOUTH EVERY DAY (Patient taking differently: 6.25 mg daily.) 45 tablet 3  .  atorvastatin (LIPITOR) 80 MG tablet TAKE 1 TABLET BY MOUTH EVERY DAY (Patient taking differently: 40 mg.) 90 tablet 3  . Coenzyme Q10 (COQ10 PO) Take by mouth.    . dutasteride (AVODART) 0.5 MG capsule Take 0.5 mg by mouth daily.    Marland Kitchen ezetimibe (ZETIA) 10 MG tablet TAKE 1 TABLET BY MOUTH EVERY DAY 90 tablet 3  . Multiple Vitamin (MULTIVITAMIN) capsule Take 1 capsule by mouth daily.      . Omega-3 1400 MG CAPS Take 2 capsules by mouth 2 (two) times daily.     . tamsulosin (FLOMAX) 0.4 MG CAPS capsule Take 0.4 mg by mouth daily.    . traMADol (ULTRAM) 50 MG tablet Take 1 tablet (50 mg total) by mouth every 6 (six) hours as needed. 20 tablet 0   Current Facility-Administered Medications on File Prior to Visit  Medication Dose Route Frequency Provider Last Rate Last Admin  . sodium chloride flush (NS) 0.9 % injection 10 mL  10 mL Intravenous PRN Ilissa Rosner R, DO   20 mL at 10/16/19 1401    ALLERGIES: Allergies  Allergen Reactions  . Bactroban [Mupirocin]   . Penicillins     FAMILY HISTORY: Family History  Problem Relation Age of Onset  . Heart disease Other   . Hypertension Other   . Stroke Other   . Heart attack Father   . Hypertension Father   . Hyperlipidemia Father   . Heart attack Mother   . Hypertension Mother   . Hyperlipidemia Mother       Objective:  Blood pressure (!) 168/81, pulse 62, height 5\' 10"  (1.778 m), weight 179 lb 3.2 oz (81.3 kg), SpO2 99 %. General: No acute distress.  Patient appears well-groomed.   Head:  Normocephalic/atraumatic Eyes:  Fundi examined but not visualized Neck: supple, no paraspinal tenderness, full range of motion Heart:  Regular rate and rhythm Lungs:  Clear to auscultation bilaterally Back: No paraspinal tenderness Neurological Exam: alert and oriented to person, place, and time. Speech fluent and not dysarthric, language intact.  CN II-XII intact. Bulk and tone normal, muscle strength 5/5 throughout.  Sensation to light touch  intact.  Deep tendon reflexes 2+ throughout, toes downgoing.  Finger to nose testing intact.  Gait normal, Romberg negative.     Metta Clines, DO  CC: Donald Prose, MD

## 2020-10-25 ENCOUNTER — Ambulatory Visit: Payer: 59 | Admitting: Neurology

## 2020-10-25 ENCOUNTER — Other Ambulatory Visit: Payer: Self-pay

## 2020-10-25 ENCOUNTER — Encounter: Payer: Self-pay | Admitting: Neurology

## 2020-10-25 VITALS — BP 168/81 | HR 62 | Ht 70.0 in | Wt 179.2 lb

## 2020-10-25 DIAGNOSIS — I10 Essential (primary) hypertension: Secondary | ICD-10-CM | POA: Diagnosis not present

## 2020-10-25 DIAGNOSIS — G459 Transient cerebral ischemic attack, unspecified: Secondary | ICD-10-CM | POA: Diagnosis not present

## 2020-10-25 DIAGNOSIS — I160 Hypertensive urgency: Secondary | ICD-10-CM

## 2020-10-25 DIAGNOSIS — E785 Hyperlipidemia, unspecified: Secondary | ICD-10-CM | POA: Diagnosis not present

## 2020-10-25 NOTE — Patient Instructions (Addendum)
Continue plavix, atorvastatin.  Follow up with PCP regarding blood pressure

## 2020-10-31 ENCOUNTER — Other Ambulatory Visit: Payer: Self-pay | Admitting: Neurology

## 2021-01-20 ENCOUNTER — Encounter: Payer: Self-pay | Admitting: Dermatology

## 2021-01-20 ENCOUNTER — Other Ambulatory Visit: Payer: Self-pay

## 2021-01-20 ENCOUNTER — Ambulatory Visit: Payer: 59 | Admitting: Dermatology

## 2021-01-20 DIAGNOSIS — D225 Melanocytic nevi of trunk: Secondary | ICD-10-CM

## 2021-01-20 DIAGNOSIS — Z1283 Encounter for screening for malignant neoplasm of skin: Secondary | ICD-10-CM

## 2021-01-20 DIAGNOSIS — L719 Rosacea, unspecified: Secondary | ICD-10-CM | POA: Diagnosis not present

## 2021-01-20 DIAGNOSIS — D229 Melanocytic nevi, unspecified: Secondary | ICD-10-CM

## 2021-01-23 ENCOUNTER — Ambulatory Visit: Payer: 59 | Admitting: Neurology

## 2021-01-31 ENCOUNTER — Encounter: Payer: Self-pay | Admitting: Dermatology

## 2021-01-31 NOTE — Progress Notes (Signed)
   Follow-Up Visit   Subjective  [name] [name] is a 65 y.o. male who presents for the following: Skin Problem (Tip of nose x 2 weeks- no changes).  Red spot tip of nose Location:  Duration:  Quality:  Associated Signs/Symptoms: Modifying Factors:  Severity:  Timing: Context: History of rosacea treated successfully with doxycycline  Objective  Well appearing patient in no apparent distress; mood and affect are within normal limits. Left Tip of Nose Inflammatory bump on tip of nose. Only 43 weeks old. Patient has taken doxy in the past.   Mid Back Flesh-colored 6 mm elevated papule    All skin waist up examined.   Assessment & Plan    Rosacea Left Tip of Nose  No biopsy necessary today since lesion is improving. Patient is to call if lesion grows or bleeds.  Also discussed treatment options for rosacea therapy gets multiple new spots we will phone in fortopical ivermectin/Soolantra.  Nevus Mid Back  Leave if stable      I, Lavonna Monarch, MD, have reviewed all documentation for this visit.  The documentation on 01/31/21 for the exam, diagnosis, procedures, and orders are all accurate and complete.

## 2021-05-06 ENCOUNTER — Other Ambulatory Visit: Payer: Self-pay | Admitting: Neurology

## 2021-05-27 DIAGNOSIS — R3915 Urgency of urination: Secondary | ICD-10-CM | POA: Diagnosis not present

## 2021-05-27 DIAGNOSIS — R350 Frequency of micturition: Secondary | ICD-10-CM | POA: Diagnosis not present

## 2021-06-28 ENCOUNTER — Other Ambulatory Visit: Payer: Self-pay | Admitting: Cardiovascular Disease

## 2021-06-28 DIAGNOSIS — E785 Hyperlipidemia, unspecified: Secondary | ICD-10-CM

## 2021-08-20 DIAGNOSIS — R0982 Postnasal drip: Secondary | ICD-10-CM | POA: Diagnosis not present

## 2021-08-20 DIAGNOSIS — R0981 Nasal congestion: Secondary | ICD-10-CM | POA: Diagnosis not present

## 2021-08-20 DIAGNOSIS — J019 Acute sinusitis, unspecified: Secondary | ICD-10-CM | POA: Diagnosis not present

## 2021-08-20 DIAGNOSIS — J029 Acute pharyngitis, unspecified: Secondary | ICD-10-CM | POA: Diagnosis not present

## 2021-09-04 DIAGNOSIS — Z03818 Encounter for observation for suspected exposure to other biological agents ruled out: Secondary | ICD-10-CM | POA: Diagnosis not present

## 2021-09-04 DIAGNOSIS — R059 Cough, unspecified: Secondary | ICD-10-CM | POA: Diagnosis not present

## 2021-09-04 DIAGNOSIS — R051 Acute cough: Secondary | ICD-10-CM | POA: Diagnosis not present

## 2021-09-04 DIAGNOSIS — J069 Acute upper respiratory infection, unspecified: Secondary | ICD-10-CM | POA: Diagnosis not present

## 2021-09-26 ENCOUNTER — Other Ambulatory Visit: Payer: Self-pay | Admitting: Cardiovascular Disease

## 2021-09-26 DIAGNOSIS — E785 Hyperlipidemia, unspecified: Secondary | ICD-10-CM

## 2021-09-29 ENCOUNTER — Ambulatory Visit: Payer: Medicare Other | Admitting: Dermatology

## 2021-09-29 ENCOUNTER — Encounter: Payer: Self-pay | Admitting: Dermatology

## 2021-09-29 DIAGNOSIS — Z1283 Encounter for screening for malignant neoplasm of skin: Secondary | ICD-10-CM

## 2021-09-29 DIAGNOSIS — Z85828 Personal history of other malignant neoplasm of skin: Secondary | ICD-10-CM | POA: Diagnosis not present

## 2021-10-17 ENCOUNTER — Encounter: Payer: Self-pay | Admitting: Dermatology

## 2021-10-17 NOTE — Progress Notes (Signed)
? ?  Follow-Up Visit ?  ?Subjective  ?SIM CHOQUETTE is a 66 y.o. male who presents for the following: Annual Exam (Skin check no new concerns personal history of bcc ). ? ?General skin examination ?Location:  ?Duration:  ?Quality:  ?Associated Signs/Symptoms: ?Modifying Factors:  ?Severity:  ?Timing: ?Context:  ? ?Objective  ?Well appearing patient in no apparent distress; mood and affect are within normal limits. ?Full body skin examination: No atypical pigmented lesions (all checked with dermoscopy), no new or recurrent nonmelanoma skin cancer. ? ? ? ?A full examination was performed including scalp, head, eyes, ears, nose, lips, neck, chest, axillae, abdomen, back, buttocks, bilateral upper extremities, bilateral lower extremities, hands, feet, fingers, toes, fingernails, and toenails. All findings within normal limits unless otherwise noted below. ? ? ?Assessment & Plan  ? ? ?Encounter for screening for malignant neoplasm of skin ? ?Annual skin examination, encouraged to self examine twice annually.  Continued ultraviolet protection. ? ? ? ? ? ?I, Lavonna Monarch, MD, have reviewed all documentation for this visit.  The documentation on 10/17/21 for the exam, diagnosis, procedures, and orders are all accurate and complete. ?

## 2021-10-27 ENCOUNTER — Other Ambulatory Visit: Payer: Self-pay

## 2021-10-27 DIAGNOSIS — Z79899 Other long term (current) drug therapy: Secondary | ICD-10-CM | POA: Diagnosis not present

## 2021-10-27 DIAGNOSIS — I2510 Atherosclerotic heart disease of native coronary artery without angina pectoris: Secondary | ICD-10-CM | POA: Diagnosis not present

## 2021-10-27 DIAGNOSIS — I10 Essential (primary) hypertension: Secondary | ICD-10-CM | POA: Diagnosis not present

## 2021-10-27 DIAGNOSIS — N419 Inflammatory disease of prostate, unspecified: Secondary | ICD-10-CM

## 2021-10-27 DIAGNOSIS — E785 Hyperlipidemia, unspecified: Secondary | ICD-10-CM

## 2021-10-27 LAB — CBC
Hematocrit: 43.5 % (ref 37.5–51.0)
Hemoglobin: 15 g/dL (ref 13.0–17.7)
MCH: 32.1 pg (ref 26.6–33.0)
MCHC: 34.5 g/dL (ref 31.5–35.7)
MCV: 93 fL (ref 79–97)
Platelets: 205 10*3/uL (ref 150–450)
RBC: 4.67 x10E6/uL (ref 4.14–5.80)
RDW: 13.2 % (ref 11.6–15.4)
WBC: 7.6 10*3/uL (ref 3.4–10.8)

## 2021-10-27 LAB — COMPREHENSIVE METABOLIC PANEL
ALT: 29 IU/L (ref 0–44)
AST: 24 IU/L (ref 0–40)
Albumin/Globulin Ratio: 2.1 (ref 1.2–2.2)
Albumin: 4.8 g/dL (ref 3.8–4.8)
Alkaline Phosphatase: 81 IU/L (ref 44–121)
BUN/Creatinine Ratio: 14 (ref 10–24)
BUN: 12 mg/dL (ref 8–27)
Bilirubin Total: 1 mg/dL (ref 0.0–1.2)
CO2: 25 mmol/L (ref 20–29)
Calcium: 9.5 mg/dL (ref 8.6–10.2)
Chloride: 104 mmol/L (ref 96–106)
Creatinine, Ser: 0.87 mg/dL (ref 0.76–1.27)
Globulin, Total: 2.3 g/dL (ref 1.5–4.5)
Glucose: 109 mg/dL — ABNORMAL HIGH (ref 70–99)
Potassium: 4.5 mmol/L (ref 3.5–5.2)
Sodium: 141 mmol/L (ref 134–144)
Total Protein: 7.1 g/dL (ref 6.0–8.5)
eGFR: 96 mL/min/{1.73_m2} (ref >59)

## 2021-10-27 LAB — LIPID PANEL
Chol/HDL Ratio: 2.3 ratio (ref 0.0–5.0)
Cholesterol, Total: 122 mg/dL (ref 100–199)
HDL: 53 mg/dL (ref >39)
LDL Chol Calc (NIH): 54 mg/dL (ref 0–99)
Triglycerides: 77 mg/dL (ref 0–149)
VLDL Cholesterol Cal: 15 mg/dL (ref 5–40)

## 2021-10-27 LAB — PSA: Prostate Specific Ag, Serum: 0.9 ng/mL (ref 0.0–4.0)

## 2021-10-27 LAB — TSH: TSH: 1.24 u[IU]/mL (ref 0.450–4.500)

## 2021-10-27 NOTE — Progress Notes (Signed)
Pt walked in for labwork before his upcoming appt this week. He states that he was called and told to come in for lab. I do not see that there is lab ordered. Informed pt, and that I would enter orders from last annual appt and if this is not what is needed. Upon review of chart order were entered  as noted for last annual appt and not responsible if this is not what what was needed.

## 2021-10-28 DIAGNOSIS — Z0000 Encounter for general adult medical examination without abnormal findings: Secondary | ICD-10-CM | POA: Diagnosis not present

## 2021-10-29 ENCOUNTER — Ambulatory Visit: Payer: Medicare Other | Admitting: Cardiovascular Disease

## 2021-10-29 ENCOUNTER — Encounter: Payer: Self-pay | Admitting: Cardiovascular Disease

## 2021-10-29 VITALS — BP 144/78 | HR 55 | Ht 70.0 in | Wt 174.6 lb

## 2021-10-29 DIAGNOSIS — N419 Inflammatory disease of prostate, unspecified: Secondary | ICD-10-CM | POA: Diagnosis not present

## 2021-10-29 DIAGNOSIS — E785 Hyperlipidemia, unspecified: Secondary | ICD-10-CM | POA: Diagnosis not present

## 2021-10-29 DIAGNOSIS — I2510 Atherosclerotic heart disease of native coronary artery without angina pectoris: Secondary | ICD-10-CM | POA: Diagnosis not present

## 2021-10-29 DIAGNOSIS — I10 Essential (primary) hypertension: Secondary | ICD-10-CM

## 2021-10-29 MED ORDER — HYDRALAZINE HCL 10 MG PO TABS: 10.0000 mg | ORAL_TABLET | Freq: Every day | ORAL | 6 refills | Status: DC | PRN

## 2021-10-29 MED ORDER — ATORVASTATIN CALCIUM 40 MG PO TABS: 40.0000 mg | ORAL_TABLET | Freq: Every day | ORAL | 6 refills | Status: DC

## 2021-10-29 NOTE — Progress Notes (Signed)
Patient ID: [name] [name], male   DOB: [birth year], 66 y.o.   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     [name] [name] in the past has been followed by Dr. Dannielle Burn and later Dr. Mare Ferrari.  I first saw [name] [name] on 10/27/2012 when he presented to establish cardiology care with me and expressed his interest in aggressive preventive cardiology assessment. He has a strong family history for heart disease in both parents as well as grandparents. He has a history of hypertension and hyperlipidemia.  Remotely when followed by Dr. Mare Ferrari he had undergone several myocardial perfusion studies.  In 2006 and 2007 he also underwent several [facility] heart lab evaluations. In September 2006 his cholesterol was 188, LDL 90, HDL 40 but his triglycerides were 292. He is LDL III a + b. percent was significantly increased at 50.5. HDL2b  percent was low at 11. He has been on statin plus zetia for lipid lowering therapy. In 2008 he began to be followed by Dr. Lesle [name]. A chest CT revealed a calcium score 194. He did have mild coronary obstructive disease with less than 50% calcific lesion noted in the mid LAD and less than 30% calcific disease in the proximal and mid RCA.  Subsequently, he has undergone an exercise stress echo study in November 2011 which was normal. The patient has remained asymptomatic and most of his studies were screening evaluations for CAD. Since Dr. Lesle [name] has left his practice in [place], [name] [name] has selected me to pursue his aggressive preventive cardiology evaluation.  [name] [name] remains very physically active. He exercises regularly and works out with a trainer at least 2 days per week. This past year he skied both at [place], [place] and [place], [place] without cardiovascular problems. He also does a ladder protocol on the elliptical trainer he has a 7 handicap in golf. He was seen in the emergency room at  [facility] for some symptoms of chest sensation in October 2013.   When I initially saw him, I recommended a complete set of laboratory including an NMR lipoprotein of with lipid panel. In addition I also recommended that he undergo a cardiopulmonary met test to assess for not only macrovascular angina but potential microvascular ischemia or endothelial dysfunction findings. Cardiopulmonary met test was done on 11/21/2012. This revealed a slightly reduced peak maximum oxygen consumption 80% of predicted he did have a low peak stroke volume. His anaerobic threshold was in the normal range at 13.7 mL's per kilogram per minute. Abnormal pulmonary status. Laboratory revealed a fasting glucose of 105. LDL cholesterol is excellent at 61 with an LDL particle number 01/13/1996 however, his triglycerides were still elevated at 325 and increased large VLDL of 9.5 His LDL particles were 631. Had a normal HDL of 46 and normal HDL particle number 39.4. His insulin resistance score was increased at 57.  On 03/28/2013, laboratory revealed a fasting glucose of 102. Bilirubin was 1.3. He had normal SGOT and SGPT. LDL particle number was excellent at 824, LDL 63, triglycerides were markedly improved but still minimally elevated at 169. Large LDL was also mildly elevated at 6.4 as was small LDL particle number at 654. HDL cholesterol was 47. Insulin resistance score was somewhat improved but again still mildly elevated at 52.  Since I last saw him in May 2015, he has continued to be fairly active.  However, recently he  admits to experiencing 4-5 episodes of a chest pressure sensation.  This was not always precipitated by activity.  He had gone skiing and was able to ski but did note some sharp twinges of chest discomfort.  Because of his significant family history for premature coronary artery disease and with significant disease in all 4 grandparents, father and mother, he was concerned about these episodes of chest  pain and presents for evaluation.  A follow-up NMR lipoprotein on 07/05/2014 showed a total cholesterol of 187, triglycerides were mildly elevated at 173.  HDL cholesterol is excellent at 59 and LDL cholesterol was 93.  He had 566 small LDL particles in his LDL particle number was 1211.  Insulin resistance score was slightly elevated at 59, which had increased from 46 9 months previously.  In early 2016 he had complained of some very mild episodes of vague chest sensation.  Remotely, a chest CT revealed a calcium score 194 and he had mild coronary obstructive disease.  With his  chest pain development I recommended that he undergo an exercise Myoview study.  This was done on 07/24/2014 and revealed normal perfusion without scar or ischemia.  Post stress ejection fraction was 64%.  He uunderwent follow-up lipid panel on 10/30/2014.  This was improved with a total cholesterol 151, triglycerides 152, HDL 51, LDL 70.  When I saw him last year one year ago he was exercising anywhere from 5-7 days per week , typically does cardio alternating with weights.  He had markedly reduced his intake.  At least one meal per day as a salad with protein.  I saw him in November 2018 at which time he stated that he was doing well and remained stable and active.   He denied any episodes of chest pressure or palpitations.  He stated that his blood pressure was stable on  amlodipine/benazepril 5/10 , and atenolol 25 mg daily. Laboratory showed a fasting glucose of 104.  His CBC was normal. Lipid studies were improved with a total cholesterol 146, triglycerides 97, HDL 61, VLDL 19, and LDL cholesterol 66.  He is on atorvastatin 40 mg and Zetia 10 mg in addition to omega-3 fatty acid.  TSH was normal and there was  minimal increase in ALT at 47.  He has significantly adjusted his diet which resulted in marked improvement in his previous significant triglyceride elevation.  In that evaluation I also discussed the reduce it trial with  the CPAP.  He also was on Jalyn for prostate issues.  I saw him in December 2019. Over the prior year, he had continued to do well; however,  he had developed a significant episode of low back discomfort and during this time he could not exercise for approximately 4 weeks.  Subsequently, he  resumed his working out with a trainer on Mondays and Thursdays and does cardiac work at least 3 days/week with an elliptical at home. He eats salads 4 days/week typically with fish for protein. He ate more over the Thanksgiving break including foods with more sodium.  He states his blood pressure typically has been running in the  115 to -128 range with diastolics 81-27.  I saw him, his blood pressure was elevated and the night prior to seeing him he had eaten pizza with sausage and Canadian bacon.  At that time, I again reviewed the new hypertensive guidelines and suggested further titration of his benazepril to 20 mg since he had been on amlodipine/benazepril combination of 5/10.  Lipid studies had  slightly increased with an LDL up to 88.  Since I saw him, he apparently was evaluated by Raquel her pharmacist in the office setting and on telephone.  His medications were adjusted and he now is on an increased dose of amlodipine at 10 mg and is no longer on ACE inhibition.  He continues to be on atenolol 25 mg tablet for which he takes a quarter of a tablet daily.  He is on atorvastatin at 40 mg and apparently has been cutting his Zetia 10 mg pill in half to 5 mg.  He continues to be on dutasteride and tamsulosin for prostate issues.  When I saw him in June 2020,  he was continuing to be active.  He was  working with a Clinical research associate 2 days/week at a trainers house since gyms are closed and on Tuesday Friday and Saturdays he does elliptical work.  He often plays golf.  He denied chest pain,PND, orthopnea or palpitations.   I saw him in January 2021.  At that time he was more cognizant of sodium in his diet.  Presently, he is on  amlodipine 10 mg daily, atenolol 6.25 mg.  At his last office visit, he was breaking his Zetia in half and I recommended resuming 10 mg daily.  He continued to be on atorvastatin one half of an 80 mg tablet.  He continues to be on Jalyn for prostate issues.  He also takes over-the-counter omega-3 fatty acid.  He had undergone repeat laboratory on June 12, 2019 which was excellent.  Specifically, total cholesterol was improved at 134, LDL 63 triglycerides 91 HDL 54.  Chemistry CBC PSA and TSH were all normal.    I last saw him on August 01, 2020.  Over the prior year he continued to do well.    He underwent an echo Doppler study in May 2021 which showed an EF of 60 to 65%, mild concentric LVH and grade 1 diastolic dysfunction.  He had normal pulmonary pressures.  There was very mild mitral regurgitation and aortic insufficiency.  He states his blood pressure readings at home are around 120/70 with pulse in the mid 50s.  He sees Dr. Nancy Fetter of [facility] and Dr. Jeffie Pollock of urology.  He apparently was diagnosed with a cyst on his left ring finger which may need excision.  Apparently it was advised that he see me prior to his evaluation since I had not seen him in over a year and he presents for evaluation.  He continues to be stable without chest pain PND orthopnea.    Since I last saw him, he remains active.  He admits to some weight gain and has a target goal down to 160.  His blood pressure has been stable and he believes 90% of the time his blood pressure is around 128/74.  However at other times it increases to 145/50.  He has been taking amlodipine/benazepril 5/10 mg daily and atenolol 12.5 mg daily.  He is on atorvastatin 40 mg and Zetia 10 mg for hyperlipidemia.  He had undergone laboratory on May 22.  CBC was stable.  Comprehensive metabolic panel was stable although glucose was 109.  Lipids were excellent with total cholesterol 122, triglycerides 77, HDL 53 and LDL 54.  PSA was normal at 0.9.  He would like to  try decreasing his atorvastatin.  He presents for evaluation.   Past Medical History:  Diagnosis Date   Back problem    Coronary artery disease (CAD) excluded  status post cardiac CT scan with only mild plaquing and no obstructive lesions 2006status post stress echo, ejection fraction 55% able to achieved 10  METS   Dyslipidemia    History of MRSA infection    Hypertension    Hypertriglyceridemia    Intravascular papillary endothelial hyperplasia    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   Left Lower Flank   TIA (transient ischemic attack)     Past Surgical History:  Procedure Laterality Date   BACK SURGERY     C-6-7 cervical   HAND SURGERY     Intravascular papillary endothelial hyperplasia   MASS EXCISION Left 09/03/2020   Procedure: EXCISION MASS PROXIMAL PHALANX LEFT RING FINGER;  Surgeon: Daryll Brod, MD;  Location: [facility];  Service: Orthopedics;  Laterality: Left;    Allergies  Allergen Reactions   Bactroban [Mupirocin]    Penicillins     Current Outpatient Medications  Medication Sig Dispense Refill   amLODipine-benazepril (LOTREL) 5-10 MG capsule TAKE ONE CAPSULE BY MOUTH DAILY 90 capsule 0   aspirin 81 MG EC tablet Take 81 mg by mouth daily.       atenolol (TENORMIN) 25 MG tablet TAKE 1/2 TABLET BY MOUTH EVERY DAY 45 tablet 0   Coenzyme Q10 (COQ10 PO) Take by mouth.     doxycycline (VIBRA-TABS) 100 MG tablet Take 100 mg by mouth 2 (two) times daily. Take I po bid x 7 days     dutasteride (AVODART) 0.5 MG capsule Take 0.5 mg by mouth daily.     ezetimibe (ZETIA) 10 MG tablet TAKE 1 TABLET BY MOUTH EVERY, PATIENT MUST SCHEDULE APPOINTMENT FOR FUTURE REFILLS 90 tablet 0   hydrALAZINE (APRESOLINE) 10 MG tablet Take 1 tablet (10 mg total) by mouth daily as needed. 30 tablet 6   Multiple Vitamin (MULTIVITAMIN) capsule Take 1 capsule by mouth daily.       Omega-3 1400 MG CAPS Take 2 capsules by mouth 2 (two) times daily.       tamsulosin (FLOMAX) 0.4 MG CAPS capsule Take 0.4 mg by mouth daily.     atorvastatin (LIPITOR) 40 MG tablet Take 1 tablet (40 mg total) by mouth daily. 30 tablet 6   traMADol (ULTRAM) 50 MG tablet Take 1 tablet (50 mg total) by mouth every 6 (six) hours as needed. (Patient not taking: Reported on 10/29/2021) 20 tablet 0   No current facility-administered medications for this visit.   Facility-Administered Medications Ordered in Other Visits  Medication Dose Route Frequency Provider Last Rate Last Admin   sodium chloride flush (NS) 0.9 % injection 10 mL  10 mL Intravenous PRN Jaffe, Adam R, DO   20 mL at 10/16/19 1401   Socially, he is married for 28 years. He works in Psychologist, forensic. He did his undergraduate degree at [employer]. 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 There is no tobacco history. He does drink alcohol socially. He remains active with athletics.  Family History  Problem Relation Age of Onset   Heart disease Other    Hypertension Other    Stroke Other    Heart attack Father    Hypertension Father    Hyperlipidemia Father    Heart attack Mother    Hypertension Mother    Hyperlipidemia Mother    ROS General: Negative; No fevers, chills, or night sweats; there is no change in weight HEENT: Negative; No changes in vision or hearing, sinus congestion, difficulty swallowing Pulmonary:  He is status post recent allergy and treatment with Z-Pak.; No cough, wheezing, shortness of breath, hemoptysis Cardiovascular: Negative; No chest pain, presyncope, syncope, palpatations GI: Negative; No nausea, vomiting, diarrhea, or abdominal pain GU: Negative; No dysuria, hematuria, or difficulty voiding Musculoskeletal: An episode of low back discomfort.  Remote C6-7 anterior approach neck surgery over 20 years ago by Dr. Joya Salm Hematologic/Oncology: Negative; no easy bruising, bleeding Endocrine: Negative; no heat/cold intolerance; no diabetes Neuro:  Negative; no changes in balance, headaches Skin: Negative; No rashes or skin lesions Psychiatric: Negative; No behavioral problems, depression Sleep: Negative; No snoring, daytime sleepiness, hypersomnolence, bruxism, restless legs, hypnogognic hallucinations, no cataplexy Other comprehensive 14 point system review is negative.   PE BP (!) 144/78 (BP Location: Left Arm, Patient Position: Sitting, Cuff Size: Normal)   Pulse (!) 55   Ht '5\' 10"'  (1.778 m)   Wt 174 lb 9.6 oz (79.2 kg)   SpO2 96%   BMI 25.05 kg/m    Repeat blood pressure by me was 130/70  Wt Readings from Last 3 Encounters:  10/29/21 174 lb 9.6 oz (79.2 kg)  10/25/20 179 lb 3.2 oz (81.3 kg)  09/03/20 177 lb 11.1 oz (80.6 kg)    General: Alert, oriented, no distress.  Skin: normal turgor, no rashes, warm and dry HEENT: Normocephalic, atraumatic. Pupils equal round and reactive to light; sclera anicteric; extraocular muscles intact; Nose without nasal septal hypertrophy Mouth/Parynx benign; Mallinpatti scale 2 Neck: No JVD, no carotid bruits; normal carotid upstroke Lungs: clear to ausculatation and percussion; no wheezing or rales Chest wall: without tenderness to palpitation Heart: PMI not displaced, RRR, s1 s2 normal, 1/6 systolic murmur, no diastolic murmur, no rubs, gallops, thrills, or heaves Abdomen: soft, nontender; no hepatosplenomehaly, BS+; abdominal aorta nontender and not dilated by palpation. Back: no CVA tenderness Pulses 2+ Musculoskeletal: full range of motion, normal strength, no joint deformities Extremities: no clubbing cyanosis or edema, Homan's sign negative  Neurologic: grossly nonfocal; Cranial nerves grossly wnl Psychologic: Normal mood and affect    May 24, 2023ECG (independently read by me): Sinus rhythm at 55, early transition  August 01, 2020 ECG (independently read by me): NSR at 70, no ectopy, normal intervals   January 2021 ECG (independently read by me): Sinus rhythm with an  isolated PAC.  Early transition.  Normal intervals.  No ST segment changes  June  2020 ECG (independently read by me): Sinus bradycardia 58; Normal intervals.Early transition.  December 2019 ECG (independently read by me): Normal sinus rhythm at 68 bpm.  LVH by voltage criteria.  Normal intervals.  No ectopy.  November 2018 ECG (independently read by me): Normal sinus rhythm at 62 bpm.  No ectopy.  Normal intervals.  September 2017 ECG (independently read by me): Normal sinus rhythm at 64 bpm.  Borderline voltage criteria for LVH.   No significant ST changes.  Normal intervals.  September 2016 ECG (independently read by me): normal sinus rhythm at 61 bpm.  Normal intervals.  No ST segment changes.  February 2016 ECG (independently read by me): Normal sinus rhythm at 72 bpm.  No significant ST segment changes.  Normal intervals.  May 2015 ECG (independently read by me) sinus bradycardia 57 beats per minute.  No ectopy.  Normal intervals.  ECG: Normal sinus rhythm at 60 with voltage criteria for LVH.  LABS:    Latest Ref Rng & Units 10/27/2021    9:16 AM 07/26/2020    9:32 AM 06/12/2019   10:06 AM  BMP  Glucose 70 - 99 mg/dL 109   95   97    BUN 8 - 27 mg/dL '12   13   12    ' Creatinine 0.76 - 1.27 mg/dL 0.87   0.87   0.89    BUN/Creat Ratio 10 - '24 14   15   13    ' Sodium 134 - 144 mmol/L 141   138   141    Potassium 3.5 - 5.2 mmol/L 4.5   4.3   4.0    Chloride 96 - 106 mmol/L 104   100   102    CO2 20 - 29 mmol/L '25   22   24    ' Calcium 8.6 - 10.2 mg/dL 9.5   9.5   9.2        Latest Ref Rng & Units 10/27/2021    9:16 AM 07/26/2020    9:32 AM 06/12/2019   10:06 AM  Hepatic Function  Total Protein 6.0 - 8.5 g/dL 7.1   7.2   7.0    Albumin 3.8 - 4.8 g/dL 4.8   4.7   4.8    AST 0 - 40 IU/L '24   26   25    ' ALT 0 - 44 IU/L 29   32   36    Alk Phosphatase 44 - 121 IU/L 81   77   86    Total Bilirubin 0.0 - 1.2 mg/dL 1.0   1.2   1.1        Latest Ref Rng & Units 10/27/2021    9:16 AM  07/26/2020    9:32 AM 06/12/2019   10:06 AM  CBC  WBC 3.4 - 10.8 x10E3/uL 7.6   5.9   7.1    Hemoglobin 13.0 - 17.7 g/dL 15.0   15.1   15.0    Hematocrit 37.5 - 51.0 % 43.5   42.7   42.0    Platelets 150 - 450 x10E3/uL 205   206   224     Lab Results  Component Value Date   MCV 93 10/27/2021   MCV 93 07/26/2020   MCV 91 06/12/2019   Lab Results  Component Value Date   TSH 1.240 10/27/2021    No results found for: HGBA1C   Lipid Panel     Component Value Date/Time   CHOL 122 10/27/2021 0916   CHOL 187 07/05/2014 1218   TRIG 77 10/27/2021 0916   TRIG 173 (H) 07/05/2014 1218   HDL 53 10/27/2021 0916   HDL 59 07/05/2014 1218   CHOLHDL 2.3 10/27/2021 0916   CHOLHDL 2.7 02/07/2016 1504   VLDL 36 (H) 02/07/2016 1504   LDLCALC 54 10/27/2021 0916   LDLCALC 93 07/05/2014 1218    IMPRESSION:  1. Essential hypertension   2. Coronary artery calcification seen on CT scan   3. Hyperlipidemia with target LDL less than 70   4. History of prostatitis, unspecified prostatitis type      ASSESSMENT AND PLAN:  [name]. Rogan has is a 66 year-old active gentleman who was found to have mild coronary calcification and nonobstructive stenoses demonstrated on a prior chest CT in 2008.  He has had normal myocardial perfusion studies and stress echo studies in the past. His last nuclear study in February 2016 was unchanged and showed normal perfusion without scar or ischemia.  He has had prior blood pressure elevations on his previous dose of amlodipine/benazepril 5/10 and currently his blood pressure is improved on a  regimen consisting of only amlodipine 10 mg daily in addition to an atenolol 6.25 mg daily.  He continues to do exceptionally well and denies any chest pain or shortness of breath.  He continues to exercise regularly.  He is cognizant with reference to sodium intake and had made significant adjustments to his diet.  Presently, the majority of time his blood pressure is excellent and he  believes it is around 128/74.  About 10% of this time his blood pressure can increase to 145/50.  Blood pressure on recheck by me today was 130/70.  He was concerned about these peaks.  I have suggested a trial of hydralazine 10 mg to take on a as needed basis if blood pressure is greater than 145.  He has a weight goal down to 160.  His weight today is 174.  He will be further adjusting his diet.  He was wondering about possibly reducing his atorvastatin.  Rather than reduce his atorvastatin from 40 mg down to 20 mg I have suggested he can try alternating 20 and 40 every other day and continue to take his Zetia.  Presently his LDL is excellent at 54.  He is continuing to work but is reducing his hours.  In 4 months I am recommending follow-up comprehensive metabolic panel and lipid panel.  I will see him in 6 months for reevaluation.  Troy Sine, MD, [facility] 11/05/2021 8:20 AM

## 2021-10-29 NOTE — Patient Instructions (Signed)
Medication Instructions:  YOU MAY TAKE LIPITOR '40MG'$  ALTERNATING '20MG'$  DAILY  START HYDRALAZINE '10MG'$  TAKE WHEN BP IS >161 SYSTOLIC (TOP NUMBER)  *If you need a refill on your cardiac medications before your next appointment, please call your pharmacy*  Lab Work:    IN 3 MONTHS (AUGUST) FASTING LIPID, CMET AND LPa      If you have labs (blood work) drawn today and your tests are completely normal, you will receive your results only by: MyChart Message (if you have MyChart) OR  A paper copy in the mail If you have any lab test that is abnormal or we need to change your treatment, we will call you to review the results.  Follow-Up: Your next appointment:  NOVEMBER  In Person with Shelva Majestic, MD     Please call our office 2 months in advance to schedule this appointment   At [facility], you and your health needs are our priority.  As part of our continuing mission to provide you with exceptional heart care, we have created designated Provider Care Teams.  These Care Teams include your primary Cardiologist (physician) and Advanced Practice Providers (APPs -  Physician Assistants and Nurse Practitioners) who all work together to provide you with the care you need, when you need it.    Important Information About Sugar

## 2021-11-05 ENCOUNTER — Encounter: Payer: Self-pay | Admitting: Cardiovascular Disease

## 2021-12-12 ENCOUNTER — Other Ambulatory Visit: Payer: Self-pay | Admitting: Cardiovascular Disease

## 2021-12-12 DIAGNOSIS — E785 Hyperlipidemia, unspecified: Secondary | ICD-10-CM

## 2022-03-10 DIAGNOSIS — U071 COVID-19: Secondary | ICD-10-CM | POA: Diagnosis not present

## 2022-04-20 DIAGNOSIS — M62838 Other muscle spasm: Secondary | ICD-10-CM | POA: Diagnosis not present

## 2022-04-20 DIAGNOSIS — U099 Post covid-19 condition, unspecified: Secondary | ICD-10-CM | POA: Diagnosis not present

## 2022-04-20 DIAGNOSIS — H6123 Impacted cerumen, bilateral: Secondary | ICD-10-CM | POA: Diagnosis not present

## 2022-05-26 DIAGNOSIS — H401211 Low-tension glaucoma, right eye, mild stage: Secondary | ICD-10-CM | POA: Diagnosis not present

## 2022-05-26 DIAGNOSIS — H524 Presbyopia: Secondary | ICD-10-CM | POA: Diagnosis not present

## 2022-06-09 DIAGNOSIS — M79672 Pain in left foot: Secondary | ICD-10-CM | POA: Diagnosis not present

## 2022-07-08 DIAGNOSIS — H401211 Low-tension glaucoma, right eye, mild stage: Secondary | ICD-10-CM | POA: Diagnosis not present

## 2022-07-10 ENCOUNTER — Other Ambulatory Visit: Payer: Self-pay | Admitting: Family Medicine

## 2022-07-10 DIAGNOSIS — M79672 Pain in left foot: Secondary | ICD-10-CM

## 2022-07-10 DIAGNOSIS — R202 Paresthesia of skin: Secondary | ICD-10-CM | POA: Diagnosis not present

## 2022-07-10 DIAGNOSIS — J302 Other seasonal allergic rhinitis: Secondary | ICD-10-CM | POA: Diagnosis not present

## 2022-07-24 ENCOUNTER — Ambulatory Visit
Admission: RE | Admit: 2022-07-24 | Discharge: 2022-07-24 | Disposition: A | Discharge status: Home / Self Care | Payer: Medicare Other | Source: Ambulatory Visit | Attending: Family Medicine | Admitting: Family Medicine

## 2022-07-24 DIAGNOSIS — R2242 Localized swelling, mass and lump, left lower limb: Secondary | ICD-10-CM | POA: Diagnosis not present

## 2022-07-24 DIAGNOSIS — M79672 Pain in left foot: Secondary | ICD-10-CM

## 2022-09-09 DIAGNOSIS — E785 Hyperlipidemia, unspecified: Secondary | ICD-10-CM | POA: Diagnosis not present

## 2022-09-10 LAB — COMPREHENSIVE METABOLIC PANEL
ALT: 31 IU/L (ref 0–44)
AST: 26 IU/L (ref 0–40)
Albumin/Globulin Ratio: 1.8 (ref 1.2–2.2)
Albumin: 4.4 g/dL (ref 3.9–4.9)
Alkaline Phosphatase: 86 IU/L (ref 44–121)
BUN/Creatinine Ratio: 14 (ref 10–24)
BUN: 12 mg/dL (ref 8–27)
Bilirubin Total: 0.8 mg/dL (ref 0.0–1.2)
CO2: 21 mmol/L (ref 20–29)
Calcium: 9 mg/dL (ref 8.6–10.2)
Chloride: 102 mmol/L (ref 96–106)
Creatinine, Ser: 0.84 mg/dL (ref 0.76–1.27)
Globulin, Total: 2.4 g/dL (ref 1.5–4.5)
Glucose: 103 mg/dL — ABNORMAL HIGH (ref 70–99)
Potassium: 4.2 mmol/L (ref 3.5–5.2)
Sodium: 137 mmol/L (ref 134–144)
Total Protein: 6.8 g/dL (ref 6.0–8.5)
eGFR: 96 mL/min/{1.73_m2} (ref >59)

## 2022-09-10 LAB — LIPID PANEL
Chol/HDL Ratio: 2.5 ratio (ref 0.0–5.0)
Cholesterol, Total: 135 mg/dL (ref 100–199)
HDL: 54 mg/dL (ref >39)
LDL Chol Calc (NIH): 61 mg/dL (ref 0–99)
Triglycerides: 112 mg/dL (ref 0–149)
VLDL Cholesterol Cal: 20 mg/dL (ref 5–40)

## 2022-09-10 LAB — LIPOPROTEIN A (LPA): Lipoprotein (a): <8.4 nmol/L (ref <75.0)

## 2022-09-14 ENCOUNTER — Encounter: Payer: Self-pay | Admitting: Cardiovascular Disease

## 2022-09-14 ENCOUNTER — Ambulatory Visit: Payer: Medicare Other | Attending: Cardiovascular Disease | Admitting: Cardiovascular Disease

## 2022-09-14 VITALS — BP 138/72 | HR 56 | Ht 70.0 in | Wt 176.4 lb

## 2022-09-14 DIAGNOSIS — N419 Inflammatory disease of prostate, unspecified: Secondary | ICD-10-CM | POA: Diagnosis not present

## 2022-09-14 DIAGNOSIS — E785 Hyperlipidemia, unspecified: Secondary | ICD-10-CM

## 2022-09-14 DIAGNOSIS — I10 Essential (primary) hypertension: Secondary | ICD-10-CM

## 2022-09-14 DIAGNOSIS — I2510 Atherosclerotic heart disease of native coronary artery without angina pectoris: Secondary | ICD-10-CM | POA: Diagnosis not present

## 2022-09-14 NOTE — Progress Notes (Signed)
Patient ID: [name] [name], male   DOB: 06-04-1956, 67 y.o.   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     [name] in the past has been followed by Dr. Andee Lineman and later Dr. Patty Sermons.  I first saw [name] on 10/27/2012 when he presented to establish cardiology care with me and expressed his interest in aggressive preventive cardiology assessment. He has a strong family history for heart disease in both parents as well as grandparents. He has a history of hypertension and hyperlipidemia.  Remotely when followed by Dr. Patty Sermons he had undergone several myocardial perfusion studies.  In 2006 and 2007 he also underwent several [facility] heart lab evaluations. In September 2006 his cholesterol was 188, LDL 90, HDL 40 but his triglycerides were 292. He is LDL III a + b. percent was significantly increased at 50.5. HDL2b  percent was low at 11. He has been on statin plus zetia for lipid lowering therapy. In 2008 he began to be followed by Dr. Candee Furbish. A chest CT revealed a calcium score 194. He did have mild coronary obstructive disease with less than 50% calcific lesion noted in the mid LAD and less than 30% calcific disease in the proximal and mid RCA.  Subsequently, he has undergone an exercise stress echo study in November 2011 which was normal. The patient has remained asymptomatic and most of his studies were screening evaluations for CAD. Since Dr. Candee Furbish has left his practice in [place], [name] has selected me to pursue his aggressive preventive cardiology evaluation.  [name] remains very physically active. He exercises regularly and works out with a trainer at least 2 days per week. This past year he skied both at [place], [place] and [place], [place] without cardiovascular problems. He also does a ladder protocol on the elliptical trainer he has a 7 handicap in golf. He was seen in the emergency room at  [facility] for some symptoms of chest sensation in October 2013.   When I initially saw him, I recommended a complete set of laboratory including an NMR lipoprotein of with lipid panel. In addition I also recommended that he undergo a cardiopulmonary met test to assess for not only macrovascular angina but potential microvascular ischemia or endothelial dysfunction findings. Cardiopulmonary met test was done on 11/21/2012. This revealed a slightly reduced peak maximum oxygen consumption 80% of predicted he did have a low peak stroke volume. His anaerobic threshold was in the normal range at 13.7 mL's per kilogram per minute. Abnormal pulmonary status. Laboratory revealed a fasting glucose of 105. LDL cholesterol is excellent at 61 with an LDL particle number 01/13/1996 however, his triglycerides were still elevated at 325 and increased large VLDL of 9.5 His LDL particles were 631. Had a normal HDL of 46 and normal HDL particle number 39.4. His insulin resistance score was increased at 57.  On 03/28/2013, laboratory revealed a fasting glucose of 102. Bilirubin was 1.3. He had normal SGOT and SGPT. LDL particle number was excellent at 824, LDL 63, triglycerides were markedly improved but still minimally elevated at 169. Large LDL was also mildly elevated at 6.4 as was small LDL particle number at 654. HDL cholesterol was 47. Insulin resistance score was somewhat improved but again still mildly elevated at 52.  Since I last saw him in May 2015, he has continued to be fairly active.  However, recently  he admits to experiencing 4-5 episodes of a chest pressure sensation.  This was not always precipitated by activity.  He had gone skiing and was able to ski but did note some sharp twinges of chest discomfort.  Because of his significant family history for premature coronary artery disease and with significant disease in all 4 grandparents, father and mother, he was concerned about these episodes of chest  pain and presents for evaluation.  A follow-up NMR lipoprotein on 07/05/2014 showed a total cholesterol of 187, triglycerides were mildly elevated at 173.  HDL cholesterol is excellent at 59 and LDL cholesterol was 93.  He had 566 small LDL particles in his LDL particle number was 1211.  Insulin resistance score was slightly elevated at 59, which had increased from 46 9 months previously.  In early 2016 he had complained of some very mild episodes of vague chest sensation.  Remotely, a chest CT revealed a calcium score 194 and he had mild coronary obstructive disease.  With his  chest pain development I recommended that he undergo an exercise Myoview study.  This was done on 07/24/2014 and revealed normal perfusion without scar or ischemia.  Post stress ejection fraction was 64%.  He uunderwent follow-up lipid panel on 10/30/2014.  This was improved with a total cholesterol 151, triglycerides 152, HDL 51, LDL 70.  When I saw him last year one year ago he was exercising anywhere from 5-7 days per week , typically does cardio alternating with weights.  He had markedly reduced his intake.  At least one meal per day as a salad with protein.  I saw him in November 2018 at which time he stated that he was doing well and remained stable and active.   He denied any episodes of chest pressure or palpitations.  He stated that his blood pressure was stable on  amlodipine/benazepril 5/10 , and atenolol 25 mg daily. Laboratory showed a fasting glucose of 104.  His CBC was normal. Lipid studies were improved with a total cholesterol 146, triglycerides 97, HDL 61, VLDL 19, and LDL cholesterol 66.  He is on atorvastatin 40 mg and Zetia 10 mg in addition to omega-3 fatty acid.  TSH was normal and there was  minimal increase in ALT at 47.  He has significantly adjusted his diet which resulted in marked improvement in his previous significant triglyceride elevation.  In that evaluation I also discussed the reduce it trial with  the CPAP.  He also was on Jalyn for prostate issues.  I saw him in December 2019. Over the prior year, he had continued to do well; however,  he had developed a significant episode of low back discomfort and during this time he could not exercise for approximately 4 weeks.  Subsequently, he  resumed his working out with a trainer on Mondays and Thursdays and does cardiac work at least 3 days/week with an elliptical at home. He eats salads 4 days/week typically with fish for protein. He ate more over the Thanksgiving break including foods with more sodium.  He states his blood pressure typically has been running in the  115 to -128 range with diastolics 75-80.  I saw him, his blood pressure was elevated and the night prior to seeing him he had eaten pizza with sausage and Canadian bacon.  At that time, I again reviewed the new hypertensive guidelines and suggested further titration of his benazepril to 20 mg since he had been on amlodipine/benazepril combination of 5/10.  Lipid studies  had slightly increased with an LDL up to 88.  Since I saw him, he apparently was evaluated by Raquel her pharmacist in the office setting and on telephone.  His medications were adjusted and he now is on an increased dose of amlodipine at 10 mg and is no longer on ACE inhibition.  He continues to be on atenolol 25 mg tablet for which he takes a quarter of a tablet daily.  He is on atorvastatin at 40 mg and apparently has been cutting his Zetia 10 mg pill in half to 5 mg.  He continues to be on dutasteride and tamsulosin for prostate issues.  When I saw him in June 2020,  he was continuing to be active.  He was  working with a Psychologist, educational 2 days/week at a trainers house since gyms are closed and on Tuesday Friday and Saturdays he does elliptical work.  He often plays golf.  He denied chest pain,PND, orthopnea or palpitations.   I saw him in January 2021.  At that time he was more cognizant of sodium in his diet.  Presently, he is on  amlodipine 10 mg daily, atenolol 6.25 mg.  At his last office visit, he was breaking his Zetia in half and I recommended resuming 10 mg daily.  He continued to be on atorvastatin one half of an 80 mg tablet.  He continues to be on Jalyn for prostate issues.  He also takes over-the-counter omega-3 fatty acid.  He had undergone repeat laboratory on June 12, 2019 which was excellent.  Specifically, total cholesterol was improved at 134, LDL 63 triglycerides 91 HDL 54.  Chemistry CBC PSA and TSH were all normal.    I saw him on August 01, 2020.  Over the prior year he continued to do well.    He underwent an echo Doppler study in May 2021 which showed an EF of 60 to 65%, mild concentric LVH and grade 1 diastolic dysfunction.  He had normal pulmonary pressures.  There was very mild mitral regurgitation and aortic insufficiency.  He states his blood pressure readings at home are around 120/70 with pulse in the mid 50s.  He sees Dr. Wynelle Link of [facility] and Dr. Annabell Howells of urology.  He apparently was diagnosed with a cyst on his left ring finger which may need excision.  Apparently it was advised that he see me prior to his evaluation since I had not seen him in over a year and he presents for evaluation.  He continues to be stable without chest pain PND orthopnea.    I last saw him on Oct 29, 2021 at which time he remained active.    He admits to some weight gain and has a target goal down to 160.  His blood pressure has been stable and he believes 90% of the time his blood pressure is around 128/74.  However at other times it increases to 145/50.  He has been taking amlodipine/benazepril 5/10 mg daily and atenolol 12.5 mg daily.  He is on atorvastatin 40 mg and Zetia 10 mg for hyperlipidemia.  He had undergone laboratory on May 22.  CBC was stable.  Comprehensive metabolic panel was stable although glucose was 109.  Lipids were excellent with total cholesterol 122, triglycerides 77, HDL 53 and LDL 54.  PSA was normal at  0.9.  He would like to try decreasing his atorvastatin.  During that evaluation, his LDL was 54.  Then reduce atorvastatin from 40 mg down to 20 mg I  suggested he alternate 20 and 40 mg every other day and continue taking his Zetia.  Since I last saw him, he has remained stable.  He denies chest pain or shortness of breath.  He does the elliptical that he will 2-3 times per week.  He remains active.  His blood pressure at home has been ranging around 1 20-1 25 over low 70s with heart rates in the mid 50s.  He had recent laboratory in September 09, 2022.  LP(a) was normal at less than 8.4.  Glucose 103.  Potassium 4.2.  Renal function and LFTs were stable.  Total cholesterol was 135.  LDL was now 61.  He would like to have his PSA and TSH checked.  He presents for evaluation.  Past Medical History:  Diagnosis Date   Back problem    Coronary artery disease (CAD) excluded    status post cardiac CT scan with only mild plaquing and no obstructive lesions 2006status post stress echo, ejection fraction 55% able to achieved 10  METS   Dyslipidemia    History of MRSA infection    Hypertension    Hypertriglyceridemia    Intravascular papillary endothelial hyperplasia    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   Left Lower Flank   TIA (transient ischemic attack)     Past Surgical History:  Procedure Laterality Date   BACK SURGERY     C-6-7 cervical   HAND SURGERY     Intravascular papillary endothelial hyperplasia   MASS EXCISION Left 09/03/2020   Procedure: EXCISION MASS PROXIMAL PHALANX LEFT RING FINGER;  Surgeon: Cindee Salt, MD;  Location: [facility];  Service: Orthopedics;  Laterality: Left;    Allergies  Allergen Reactions   Bactroban [Mupirocin]    Penicillins     Current Outpatient Medications  Medication Sig Dispense Refill   amLODipine-benazepril (LOTREL) 5-10 MG capsule TAKE ONE CAPSULE BY MOUTH DAILY 90 capsule 3   aspirin 81 MG EC tablet Take 81 mg  by mouth daily.       atenolol (TENORMIN) 25 MG tablet TAKE 1/2 TABLET BY MOUTH EVERY DAY 45 tablet 3   atorvastatin (LIPITOR) 40 MG tablet Take 1 tablet (40 mg total) by mouth daily. 30 tablet 6   Coenzyme Q10 (COQ10 PO) Take by mouth.     dutasteride (AVODART) 0.5 MG capsule Take 0.5 mg by mouth daily.     ezetimibe (ZETIA) 10 MG tablet TAKE 1 TABLET BY MOUTH EVERY, PATIENT MUST SCHEDULE APPOINTMENT FOR FUTURE REFILLS 90 tablet 3   hydrALAZINE (APRESOLINE) 10 MG tablet Take 1 tablet (10 mg total) by mouth daily as needed. 30 tablet 6   Multiple Vitamin (MULTIVITAMIN) capsule Take 1 capsule by mouth daily.       Omega-3 1400 MG CAPS Take 2 capsules by mouth 2 (two) times daily.      tamsulosin (FLOMAX) 0.4 MG CAPS capsule Take 0.4 mg by mouth daily.     No current facility-administered medications for this visit.   Facility-Administered Medications Ordered in Other Visits  Medication Dose Route Frequency Provider Last Rate Last Admin   sodium chloride flush (NS) 0.9 % injection 10 mL  10 mL Intravenous PRN Jaffe, Adam R, DO   20 mL at 10/16/19 1401   Socially, he is married for 28 years. He works in Metallurgist. He did his undergraduate degree at [employer]. 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 There is no tobacco history. He  does drink alcohol socially. He remains active with athletics.  Family History  Problem Relation Age of Onset   Heart disease Other    Hypertension Other    Stroke Other    Heart attack Father    Hypertension Father    Hyperlipidemia Father    Heart attack Mother    Hypertension Mother    Hyperlipidemia Mother    ROS General: Negative; No fevers, chills, or night sweats; there is no change in weight HEENT: Negative; No changes in vision or hearing, sinus congestion, difficulty swallowing Pulmonary: He is status post recent allergy and treatment with Z-Pak.; No cough, wheezing, shortness of breath,  hemoptysis Cardiovascular: Negative; No chest pain, presyncope, syncope, palpatations GI: Negative; No nausea, vomiting, diarrhea, or abdominal pain GU: Negative; No dysuria, hematuria, or difficulty voiding Musculoskeletal: An episode of low back discomfort.  Remote C6-7 anterior approach neck surgery over 20 years ago by Dr. Jeral Fruit Hematologic/Oncology: Negative; no easy bruising, bleeding Endocrine: Negative; no heat/cold intolerance; no diabetes Neuro: Negative; no changes in balance, headaches Skin: Negative; No rashes or skin lesions Psychiatric: Negative; No behavioral problems, depression Sleep: Negative; No snoring, daytime sleepiness, hypersomnolence, bruxism, restless legs, hypnogognic hallucinations, no cataplexy Other comprehensive 14 point system review is negative.   PE BP 138/72   Pulse (!) 56   Ht 5\' 10"  (1.778 m)   Wt 176 lb 6.4 oz (80 kg)   SpO2 99%   BMI 25.31 kg/m    Repeat blood pressure by me was 136/64  Wt Readings from Last 3 Encounters:  09/14/22 176 lb 6.4 oz (80 kg)  10/29/21 174 lb 9.6 oz (79.2 kg)  10/25/20 179 lb 3.2 oz (81.3 kg)   General: Alert, oriented, no distress.  Skin: normal turgor, no rashes, warm and dry HEENT: Normocephalic, atraumatic. Pupils equal round and reactive to light; sclera anicteric; extraocular muscles intact;  Nose without nasal septal hypertrophy Mouth/Parynx benign; Mallinpatti scale 2 Neck: No JVD, no carotid bruits; normal carotid upstroke Lungs: clear to ausculatation and percussion; no wheezing or rales Chest wall: without tenderness to palpitation Heart: PMI not displaced, RRR, s1 s2 normal, 1/6 systolic murmur, no diastolic murmur, no rubs, gallops, thrills, or heaves Abdomen: soft, nontender; no hepatosplenomehaly, BS+; abdominal aorta nontender and not dilated by palpation. Back: no CVA tenderness Pulses 2+ Musculoskeletal: full range of motion, normal strength, no joint deformities Extremities: no clubbing  cyanosis or edema, Homan's sign negative  Neurologic: grossly nonfocal; Cranial nerves grossly wnl Psychologic: Normal mood and affect  September 14, 2022 ECG (independently read by me): Sinus bradycardia at 56, LVH,    Oct 29, 2021 ECG (independently read by me): Sinus rhythm at 55, early transition  August 01, 2020 ECG (independently read by me): NSR at 70, no ectopy, normal intervals   January 2021 ECG (independently read by me): Sinus rhythm with an isolated PAC.  Early transition.  Normal intervals.  No ST segment changes  June  2020 ECG (independently read by me): Sinus bradycardia 58; Normal intervals.Early transition.  December 2019 ECG (independently read by me): Normal sinus rhythm at 68 bpm.  LVH by voltage criteria.  Normal intervals.  No ectopy.  November 2018 ECG (independently read by me): Normal sinus rhythm at 62 bpm.  No ectopy.  Normal intervals.  September 2017 ECG (independently read by me): Normal sinus rhythm at 64 bpm.  Borderline voltage criteria for LVH.   No significant ST changes.  Normal intervals.  September 2016 ECG (independently read by  me): normal sinus rhythm at 61 bpm.  Normal intervals.  No ST segment changes.  February 2016 ECG (independently read by me): Normal sinus rhythm at 72 bpm.  No significant ST segment changes.  Normal intervals.  May 2015 ECG (independently read by me) sinus bradycardia 57 beats per minute.  No ectopy.  Normal intervals.  ECG: Normal sinus rhythm at 60 with voltage criteria for LVH.  LABS:    Latest Ref Rng & Units 09/09/2022    8:29 AM 10/27/2021    9:16 AM 07/26/2020    9:32 AM  BMP  Glucose 70 - 99 mg/dL 952  841  95   BUN 8 - 27 mg/dL 12  12  13    Creatinine 0.76 - 1.27 mg/dL 3.24  4.01  0.27   BUN/Creat Ratio 10 - 24 14  14  15    Sodium 134 - 144 mmol/L 137  141  138   Potassium 3.5 - 5.2 mmol/L 4.2  4.5  4.3   Chloride 96 - 106 mmol/L 102  104  100   CO2 20 - 29 mmol/L 21  25  22    Calcium 8.6 - 10.2 mg/dL 9.0   9.5  9.5       Latest Ref Rng & Units 09/09/2022    8:29 AM 10/27/2021    9:16 AM 07/26/2020    9:32 AM  Hepatic Function  Total Protein 6.0 - 8.5 g/dL 6.8  7.1  7.2   Albumin 3.9 - 4.9 g/dL 4.4  4.8  4.7   AST 0 - 40 IU/L 26  24  26    ALT 0 - 44 IU/L 31  29  32   Alk Phosphatase 44 - 121 IU/L 86  81  77   Total Bilirubin 0.0 - 1.2 mg/dL 0.8  1.0  1.2       Latest Ref Rng & Units 10/27/2021    9:16 AM 07/26/2020    9:32 AM 06/12/2019   10:06 AM  CBC  WBC 3.4 - 10.8 x10E3/uL 7.6  5.9  7.1   Hemoglobin 13.0 - 17.7 g/dL 25.3  66.4  40.3   Hematocrit 37.5 - 51.0 % 43.5  42.7  42.0   Platelets 150 - 450 x10E3/uL 205  206  224    Lab Results  Component Value Date   MCV 93 10/27/2021   MCV 93 07/26/2020   MCV 91 06/12/2019   Lab Results  Component Value Date   TSH 1.240 10/27/2021    No results found for: "HGBA1C"   Lipid Panel     Component Value Date/Time   CHOL 135 09/09/2022 0829   CHOL 187 07/05/2014 1218   TRIG 112 09/09/2022 0829   TRIG 173 (H) 07/05/2014 1218   HDL 54 09/09/2022 0829   HDL 59 07/05/2014 1218   CHOLHDL 2.5 09/09/2022 0829   CHOLHDL 2.7 02/07/2016 1504   VLDL 36 (H) 02/07/2016 1504   LDLCALC 61 09/09/2022 0829   LDLCALC 93 07/05/2014 1218    IMPRESSION:  1. Essential hypertension   2. Coronary artery calcification seen on CT scan   3. Hyperlipidemia with target LDL less than 70   4. History of prostatitis, unspecified prostatitis type       ASSESSMENT AND PLAN:  Mr. Greenia has is a 67 year-old active gentleman who was found to have mild coronary calcification and nonobstructive stenoses demonstrated on a prior chest CT in 2008.  He has had normal myocardial perfusion studies and stress  echo studies in the past. His last nuclear study in February 2016 was unchanged and showed normal perfusion without scar or ischemia.  He has had prior blood pressure elevations on his previous dose of amlodipine/benazepril 5/10 and currently his blood pressure is  improved on a regimen consisting of only amlodipine 10 mg daily in addition to an atenolol 6.25 mg daily.  He continues to do exceptionally well and denies any chest pain or shortness of breath.  He continues to exercise regularly.  He is cognizant with reference to sodium intake and had made significant adjustments to his diet.  Blood pressure today is stable and he states blood pressure at home typically runs around 1 20-1 25 systolic over 70-75 with heart rates in the 50s.  He continues to be on atenolol 12.5 mg daily in addition to amlodipine/benazepril 5/10 mg daily.  He has a prescription for hydralazine 10 mg to take as needed but he has not required use.  At his last office visit he wanted to reduce his atorvastatin and I suggested he try reducing this to 40 mg alternating with 20 mg every other day and continue Zetia 10 mg daily.  Most recent lipid studies remained stable with LDL cholesterol increasing slightly from 54 up to 61.  Total cholesterol was 135 with triglycerides 112.  Chemistry is normal with minimal glucose increased at 103.  The little leg is normal.  Per his request I will check PSA and TSH levels today.  I encouraged his continued exercise and proper diet.  His ECG today remains stable and shows sinus bradycardia at 56 bpm with LVH by voltage criteria.  Will see him in 1 year for reevaluation or sooner as needed.  Lennette Bihari, MD, [facility] 09/16/2022 2:07 PM

## 2022-09-14 NOTE — Patient Instructions (Signed)
Medication Instructions:  Your physician recommends that you continue on your current medications as directed. Please refer to the Current Medication list given to you today.   *If you need a refill on your cardiac medications before your next appointment, please call your pharmacy*   Lab Work: Please return for labs (TSH, PSA)  Our in office lab hours are Monday-Friday 8:00-4:00, closed for lunch 1-2 pm.  No appointment needed.  [facility] locations:   [facility] - [address]  - [address] ([facility]) - [address] [phone] [address]   [place] - [address]    [place]  - [address]    [place] - [address] - [address] [facility] [facility]  - [address] - [address] ([facility])  Follow-Up: At [facility], you and your health needs are our priority.  As part of our continuing mission to provide you with exceptional heart care, we have created designated Provider Care Teams.  These Care Teams include your primary Cardiologist (physician) and Advanced Practice Providers (APPs -  Physician Assistants and Nurse Practitioners) who all work together to provide you with the care you need, when you need it.  We recommend signing up for the patient portal called "MyChart".  Sign up information is provided on this After Visit Summary.  MyChart is used to connect with patients for Virtual Visits (Telemedicine).  Patients are able to view lab/test results, encounter notes, upcoming appointments, etc.  Non-urgent messages can be sent to your provider as well.   To learn more about what you can do with MyChart, go to [email or web address].    Your next appointment:   12 month(s)  Provider:   Nicki Guadalajara, MD

## 2022-09-16 ENCOUNTER — Encounter: Payer: Self-pay | Admitting: Cardiovascular Disease

## 2022-11-02 ENCOUNTER — Other Ambulatory Visit: Payer: Self-pay | Admitting: Cardiovascular Disease

## 2022-11-02 DIAGNOSIS — E785 Hyperlipidemia, unspecified: Secondary | ICD-10-CM

## 2022-11-04 ENCOUNTER — Other Ambulatory Visit: Payer: Self-pay | Admitting: Cardiovascular Disease

## 2022-11-04 DIAGNOSIS — Z0000 Encounter for general adult medical examination without abnormal findings: Secondary | ICD-10-CM | POA: Diagnosis not present

## 2022-11-04 DIAGNOSIS — Z1159 Encounter for screening for other viral diseases: Secondary | ICD-10-CM | POA: Diagnosis not present

## 2022-11-04 DIAGNOSIS — R7301 Impaired fasting glucose: Secondary | ICD-10-CM | POA: Diagnosis not present

## 2022-11-04 DIAGNOSIS — E785 Hyperlipidemia, unspecified: Secondary | ICD-10-CM

## 2022-11-05 ENCOUNTER — Telehealth: Payer: Self-pay | Admitting: Cardiovascular Disease

## 2022-11-05 NOTE — Telephone Encounter (Signed)
Advised patient can have lab drawn if a [facility] order.  He states it does have [facility] and codes on the order

## 2022-11-05 NOTE — Telephone Encounter (Signed)
Patient states another doctor ordered a Hepatitis C panel and he has the physical order. He would like to know if he can bring it to the office to have it drawn when he has his other lab work. Please advise.

## 2023-01-08 DIAGNOSIS — D126 Benign neoplasm of colon, unspecified: Secondary | ICD-10-CM | POA: Diagnosis not present

## 2023-01-08 DIAGNOSIS — K6389 Other specified diseases of intestine: Secondary | ICD-10-CM | POA: Diagnosis not present

## 2023-01-08 DIAGNOSIS — K635 Polyp of colon: Secondary | ICD-10-CM | POA: Diagnosis not present

## 2023-01-08 DIAGNOSIS — Z83719 Family history of colon polyps, unspecified: Secondary | ICD-10-CM | POA: Diagnosis not present

## 2023-01-08 DIAGNOSIS — K5730 Diverticulosis of large intestine without perforation or abscess without bleeding: Secondary | ICD-10-CM | POA: Diagnosis not present

## 2023-01-08 DIAGNOSIS — D120 Benign neoplasm of cecum: Secondary | ICD-10-CM | POA: Diagnosis not present

## 2023-01-08 DIAGNOSIS — Z1211 Encounter for screening for malignant neoplasm of colon: Secondary | ICD-10-CM | POA: Diagnosis not present

## 2023-01-25 DIAGNOSIS — H35033 Hypertensive retinopathy, bilateral: Secondary | ICD-10-CM | POA: Diagnosis not present

## 2023-01-25 DIAGNOSIS — H524 Presbyopia: Secondary | ICD-10-CM | POA: Diagnosis not present

## 2023-01-28 DIAGNOSIS — S338XXA Sprain of other parts of lumbar spine and pelvis, initial encounter: Secondary | ICD-10-CM | POA: Diagnosis not present

## 2023-01-28 DIAGNOSIS — M9902 Segmental and somatic dysfunction of thoracic region: Secondary | ICD-10-CM | POA: Diagnosis not present

## 2023-01-28 DIAGNOSIS — S233XXA Sprain of ligaments of thoracic spine, initial encounter: Secondary | ICD-10-CM | POA: Diagnosis not present

## 2023-01-28 DIAGNOSIS — M9903 Segmental and somatic dysfunction of lumbar region: Secondary | ICD-10-CM | POA: Diagnosis not present

## 2023-01-28 DIAGNOSIS — M47812 Spondylosis without myelopathy or radiculopathy, cervical region: Secondary | ICD-10-CM | POA: Diagnosis not present

## 2023-01-28 DIAGNOSIS — M9901 Segmental and somatic dysfunction of cervical region: Secondary | ICD-10-CM | POA: Diagnosis not present

## 2023-02-17 DIAGNOSIS — D229 Melanocytic nevi, unspecified: Secondary | ICD-10-CM | POA: Diagnosis not present

## 2023-02-17 DIAGNOSIS — D1801 Hemangioma of skin and subcutaneous tissue: Secondary | ICD-10-CM | POA: Diagnosis not present

## 2023-02-17 DIAGNOSIS — L814 Other melanin hyperpigmentation: Secondary | ICD-10-CM | POA: Diagnosis not present

## 2023-02-17 DIAGNOSIS — L578 Other skin changes due to chronic exposure to nonionizing radiation: Secondary | ICD-10-CM | POA: Diagnosis not present

## 2023-02-17 DIAGNOSIS — L821 Other seborrheic keratosis: Secondary | ICD-10-CM | POA: Diagnosis not present

## 2023-02-18 DIAGNOSIS — M9901 Segmental and somatic dysfunction of cervical region: Secondary | ICD-10-CM | POA: Diagnosis not present

## 2023-02-18 DIAGNOSIS — S338XXA Sprain of other parts of lumbar spine and pelvis, initial encounter: Secondary | ICD-10-CM | POA: Diagnosis not present

## 2023-02-18 DIAGNOSIS — M47812 Spondylosis without myelopathy or radiculopathy, cervical region: Secondary | ICD-10-CM | POA: Diagnosis not present

## 2023-02-18 DIAGNOSIS — S233XXA Sprain of ligaments of thoracic spine, initial encounter: Secondary | ICD-10-CM | POA: Diagnosis not present

## 2023-02-18 DIAGNOSIS — M9902 Segmental and somatic dysfunction of thoracic region: Secondary | ICD-10-CM | POA: Diagnosis not present

## 2023-02-18 DIAGNOSIS — M9903 Segmental and somatic dysfunction of lumbar region: Secondary | ICD-10-CM | POA: Diagnosis not present

## 2023-03-24 DIAGNOSIS — M47812 Spondylosis without myelopathy or radiculopathy, cervical region: Secondary | ICD-10-CM | POA: Diagnosis not present

## 2023-03-24 DIAGNOSIS — M9901 Segmental and somatic dysfunction of cervical region: Secondary | ICD-10-CM | POA: Diagnosis not present

## 2023-03-24 DIAGNOSIS — M9902 Segmental and somatic dysfunction of thoracic region: Secondary | ICD-10-CM | POA: Diagnosis not present

## 2023-03-24 DIAGNOSIS — M9903 Segmental and somatic dysfunction of lumbar region: Secondary | ICD-10-CM | POA: Diagnosis not present

## 2023-03-24 DIAGNOSIS — S233XXA Sprain of ligaments of thoracic spine, initial encounter: Secondary | ICD-10-CM | POA: Diagnosis not present

## 2023-03-24 DIAGNOSIS — S338XXA Sprain of other parts of lumbar spine and pelvis, initial encounter: Secondary | ICD-10-CM | POA: Diagnosis not present

## 2023-03-30 DIAGNOSIS — E785 Hyperlipidemia, unspecified: Secondary | ICD-10-CM | POA: Diagnosis not present

## 2023-03-30 DIAGNOSIS — I10 Essential (primary) hypertension: Secondary | ICD-10-CM | POA: Diagnosis not present

## 2023-03-31 LAB — PSA: Prostate Specific Ag, Serum: 0.5 ng/mL (ref 0.0–4.0)

## 2023-03-31 LAB — TSH: TSH: 1.11 u[IU]/mL (ref 0.450–4.500)

## 2023-04-09 ENCOUNTER — Telehealth: Payer: Self-pay | Admitting: Cardiovascular Disease

## 2023-04-09 ENCOUNTER — Other Ambulatory Visit: Payer: Self-pay

## 2023-04-09 DIAGNOSIS — I10 Essential (primary) hypertension: Secondary | ICD-10-CM

## 2023-04-09 NOTE — Telephone Encounter (Signed)
Pt calling in wanting to schedule with Pharm D due to Hypertension. Requesting cb

## 2023-04-24 ENCOUNTER — Other Ambulatory Visit: Payer: Self-pay | Admitting: Cardiovascular Disease

## 2023-04-24 DIAGNOSIS — E785 Hyperlipidemia, unspecified: Secondary | ICD-10-CM

## 2023-05-05 DIAGNOSIS — M47812 Spondylosis without myelopathy or radiculopathy, cervical region: Secondary | ICD-10-CM | POA: Diagnosis not present

## 2023-05-05 DIAGNOSIS — M9903 Segmental and somatic dysfunction of lumbar region: Secondary | ICD-10-CM | POA: Diagnosis not present

## 2023-05-05 DIAGNOSIS — S233XXA Sprain of ligaments of thoracic spine, initial encounter: Secondary | ICD-10-CM | POA: Diagnosis not present

## 2023-05-05 DIAGNOSIS — S338XXA Sprain of other parts of lumbar spine and pelvis, initial encounter: Secondary | ICD-10-CM | POA: Diagnosis not present

## 2023-05-05 DIAGNOSIS — M9901 Segmental and somatic dysfunction of cervical region: Secondary | ICD-10-CM | POA: Diagnosis not present

## 2023-05-05 DIAGNOSIS — M9902 Segmental and somatic dysfunction of thoracic region: Secondary | ICD-10-CM | POA: Diagnosis not present

## 2023-05-11 ENCOUNTER — Ambulatory Visit: Payer: Medicare Other | Attending: Cardiology | Admitting: Pharmacist Clinician (PhC)/ Clinical Pharmacy Specialist

## 2023-05-11 ENCOUNTER — Encounter: Payer: Self-pay | Admitting: Pharmacist Clinician (PhC)/ Clinical Pharmacy Specialist

## 2023-05-11 VITALS — BP 151/80 | HR 66

## 2023-05-11 DIAGNOSIS — I10 Essential (primary) hypertension: Secondary | ICD-10-CM | POA: Diagnosis not present

## 2023-05-11 MED ORDER — HYDRALAZINE HCL 10 MG PO TABS: ORAL_TABLET | ORAL | 6 refills | Status: DC

## 2023-05-11 NOTE — Patient Instructions (Signed)
Take your BP meds as follows:  continue with your current medications  If your systolic blood pressure is > 150 (top number) take 10 mg of hydralazine.    Check your blood pressure at home daily (if able) and keep record of the readings.  Your blood pressure goal is < 130/80  To check your pressure at home you will need to:  1. Sit up in a chair, with feet flat on the floor and back supported. Do not cross your ankles or legs. 2. Rest your left arm so that the cuff is about heart level. If the cuff goes on your upper arm,  then just relax the arm on the table, arm of the chair or your lap. If you have a wrist cuff, we  suggest relaxing your wrist against your chest (think of it as Pledging the Flag with the  wrong arm).  3. Place the cuff snugly around your arm, about 1 inch above the crook of your elbow. The  cords should be inside the groove of your elbow.  4. Sit quietly, with the cuff in place, for about 5 minutes. After that 5 minutes press the power  button to start a reading. 5. Do not talk or move while the reading is taking place.  6. Record your readings on a sheet of paper. Although most cuffs have a memory, it is often  easier to see a pattern developing when the numbers are all in front of you.  7. You can repeat the reading after 1-3 minutes if it is recommended  Make sure your bladder is empty and you have not had caffeine or tobacco within the last 30 min  Always bring your blood pressure log with you to your appointments. If you have not brought your monitor in to be double checked for accuracy, please bring it to your next appointment.  You can find a list of quality blood pressure cuffs at [email or web address]  Important lifestyle changes to control high blood pressure  Intervention  Effect on the BP  Lose extra pounds and watch your waistline Weight loss is one of the most effective lifestyle changes for controlling blood pressure. If you're overweight or obese, losing  even a small amount of weight can help reduce blood pressure. Blood pressure might go down by about 1 millimeter of mercury (mm Hg) with each kilogram (about 2.2 pounds) of weight lost.  Exercise regularly As a general goal, aim for at least 30 minutes of moderate physical activity every day. Regular physical activity can lower high blood pressure by about 5 to 8 mm Hg.  Eat a healthy diet Eating a diet rich in whole grains, fruits, vegetables, and low-fat dairy products and low in saturated fat and cholesterol. A healthy diet can lower high blood pressure by up to 11 mm Hg.  Reduce salt (sodium) in your diet Even a small reduction of sodium in the diet can improve heart health and reduce high blood pressure by about 5 to 6 mm Hg.  Limit alcohol One drink equals 12 ounces of beer, 5 ounces of wine, or 1.5 ounces of 80-proof liquor.  Limiting alcohol to less than one drink a day for women or two drinks a day for men can help lower blood pressure by about 4 mm Hg.   If you have any questions or concerns please use My Chart to send questions or call the office at [phone]

## 2023-05-11 NOTE — Progress Notes (Signed)
Office Visit    Patient Name: [name] [name] Date of Encounter: 05/11/2023  Primary Care Provider:  Deatra James, MD Primary Cardiologist:  Nicki Guadalajara, MD  Chief Complaint    Hypertension  Significant Past Medical History   CAD CAC = 194  HLD LDL, TG both at goal on atorvastatin 40, ezetimibe 10 and OTC fish oil    Allergies  Allergen Reactions   Bactroban [Mupirocin]    Penicillins     History of Present Illness    [name] [name] is a 67 y.o. male patient of Dr Tresa Endo, in the office today for hypertension management.  He was last seen by Dr. Tresa Endo in April, at which time his pressure was only slightly elevated at 138/72.    Since then his home readings have been mostly normal, but he was concerned when his pressure spiked up to 175/95 in late October.  He wasn't sure why, and took 1/2 of a 10 mg hydralazine tablet.  Next day he was at 169/81, so took a whole 10 mg.  The next day was back down to 129/70.  Since then he reports that he had another spike, up to about 150.   He has concerns today for why he had the BP spike in October and did he treat correctly. BP eadings sarted to creep up in August/September.  Was given hydralazine 10 mg for pnr use Spike 175/95 in late October, took 5 mg hydrlaaine - 151/84 after few hours Next day169/81, took 10 mg  dropped to 129/70  Hasn't spiked since then, although gets day/two occasionally in 150 range then back to 120-130 range  126/77 x 2 days this past weekend.  Sat 126/73, yesterday 126/77 this am 131/76  Here because of spike at end of October when had to use 15 mg total hydralazine  Blood Pressure Goal:  130/80  Current Medications:  amlodipine/benazepril 5/10 mg daily, atenolol 12.5 mg daily, hydralazine 10 mg prn  Family Hx:   both parents and all grandparents with heart disease;  all lived past 36; no siblings; son with mildly elevated cholesterol  Social Hx:      Tobacco: no  Alcohol: bourbon few times weeks  Caffeine:  1 diet pepsi in the mornings  (previously drank sundrop)  Diet:    mostly home cooked meals, some Hello Fresh; not much snakcing, protein usually chicken or fish, some beef; vegetabels fresh/forzen  Exercise: precor elipticall 4-5 times per week and medicine ball, trainer at [employer]  Home BP readings: almost all readings < 130/80.  Home cuff is [employer], purchased earlier this year.   Occasionally has 1-2 day spikes with systolic going to ~150.       Accessory Clinical Findings    Lab Results  Component Value Date   CREATININE 0.84 09/09/2022   BUN 12 09/09/2022   NA 137 09/09/2022   K 4.2 09/09/2022   CL 102 09/09/2022   CO2 21 09/09/2022   Lab Results  Component Value Date   ALT 31 09/09/2022   AST 26 09/09/2022   ALKPHOS 86 09/09/2022   BILITOT 0.8 09/09/2022   No results found for: "HGBA1C"  Home Medications    Current Outpatient Medications  Medication Sig Dispense Refill   amLODipine-benazepril (LOTREL) 5-10 MG capsule TAKE ONE CAPSULE BY MOUTH DAILY 90 capsule 3   aspirin 81 MG EC tablet Take 81 mg by mouth daily.       atenolol (TENORMIN) 25 MG tablet TAKE 1/2 TABLET  BY MOUTH EVERY DAY 45 tablet 3   atorvastatin (LIPITOR) 40 MG tablet TAKE 1 TABLET BY MOUTH DAILY 90 tablet 1   Coenzyme Q10 (COQ10 PO) Take by mouth.     dutasteride (AVODART) 0.5 MG capsule Take 0.5 mg by mouth daily.     ezetimibe (ZETIA) 10 MG tablet TAKE 1 TABLET BY MOUTH EVERY DAY 90 tablet 3   hydrALAZINE (APRESOLINE) 10 MG tablet Take 1 tablet by mouth as needed for systolic BP > 150. 30 tablet 6   Multiple Vitamin (MULTIVITAMIN) capsule Take 1 capsule by mouth daily.       Omega-3 1400 MG CAPS Take 2 capsules by mouth 2 (two) times daily.      tamsulosin (FLOMAX) 0.4 MG CAPS capsule Take 0.4 mg by mouth daily.     No current facility-administered medications for this visit.   Facility-Administered Medications Ordered in Other Visits  Medication Dose Route Frequency Provider Last Rate  Last Admin   sodium chloride flush (NS) 0.9 % injection 10 mL  10 mL Intravenous PRN Everlena Cooper, Adam R, DO   20 mL at 10/16/19 1401     HYPERTENSION CONTROL Vitals:   05/11/23 0959 05/11/23 1005  BP: (!) 163/77 (!) 151/80    The patient's blood pressure is elevated above target today.  In order to address the patient's elevated BP: Blood pressure will be monitored at home to determine if medication changes need to be made.      Assessment & Plan    Essential hypertension Assessment: BP is uncontrolled in office BP 151/80 mmHg;  above the goal (<130/80). Home readings mostly WNL, with only occasional spikes to 150 or higher Tolerates amlodipine/benazepril and atenolol well without any side effects Denies SOB, palpitation, chest pain, headaches,or swelling Reiterated the importance of regular exercise and low salt diet   Plan:  Continue taking amlodipine/benazepril and atenolol Take hydralazine 10 mg as needed for SBP > 150 Patient to keep record of BP readings with heart rate and report to [place] if readings consistently elevated Patient to follow up with Dr. Tresa Endo in April  Labs ordered today:  none   Phillips Hay PharmD CPP [facility]  [address] [place], [place] [address] [phone]

## 2023-05-11 NOTE — Assessment & Plan Note (Signed)
Assessment: BP is uncontrolled in office BP 151/80 mmHg;  above the goal (<130/80). Home readings mostly WNL, with only occasional spikes to 150 or higher Tolerates amlodipine/benazepril and atenolol well without any side effects Denies SOB, palpitation, chest pain, headaches,or swelling Reiterated the importance of regular exercise and low salt diet   Plan:  Continue taking amlodipine/benazepril and atenolol Take hydralazine 10 mg as needed for SBP > 150 Patient to keep record of BP readings with heart rate and report to [place] if readings consistently elevated Patient to follow up with Dr. Tresa Endo in April  Labs ordered today:  none

## 2023-07-01 ENCOUNTER — Encounter: Payer: Self-pay | Admitting: Family Medicine

## 2023-07-01 ENCOUNTER — Other Ambulatory Visit: Payer: Self-pay | Admitting: Family Medicine

## 2023-07-01 DIAGNOSIS — R1031 Right lower quadrant pain: Secondary | ICD-10-CM | POA: Diagnosis not present

## 2023-07-01 DIAGNOSIS — I10 Essential (primary) hypertension: Secondary | ICD-10-CM | POA: Diagnosis not present

## 2023-07-06 ENCOUNTER — Ambulatory Visit
Admission: RE | Admit: 2023-07-06 | Discharge: 2023-07-06 | Discharge status: Home / Self Care | Payer: Medicare Other | Source: Ambulatory Visit | Attending: Family Medicine | Admitting: Family Medicine

## 2023-07-06 DIAGNOSIS — R1031 Right lower quadrant pain: Secondary | ICD-10-CM | POA: Diagnosis not present

## 2023-07-14 DIAGNOSIS — M25551 Pain in right hip: Secondary | ICD-10-CM | POA: Diagnosis not present

## 2023-07-20 ENCOUNTER — Ambulatory Visit: Payer: Medicare Other | Attending: Sports Medicine

## 2023-07-20 ENCOUNTER — Other Ambulatory Visit: Payer: Self-pay

## 2023-07-20 DIAGNOSIS — M6281 Muscle weakness (generalized): Secondary | ICD-10-CM | POA: Insufficient documentation

## 2023-07-20 DIAGNOSIS — M25551 Pain in right hip: Secondary | ICD-10-CM | POA: Diagnosis not present

## 2023-07-20 NOTE — Therapy (Signed)
OUTPATIENT PHYSICAL THERAPY LOWER EXTREMITY EVALUATION   Patient Name: [name] [name] MRN: [id number] DOB:1955/09/18, 68 y.o., male Today's Date: 07/21/2023  END OF SESSION:  PT End of Session - 07/20/23 1258     Visit Number 1    Number of Visits 12    Date for PT Re-Evaluation 08/31/23    Authorization Type [employer] Medicare    PT Start Time 1053    PT Stop Time 1139    PT Time Calculation (min) 46 min    Activity Tolerance Patient tolerated treatment well    Behavior During Therapy WFL for tasks assessed/performed             Past Medical History:  Diagnosis Date   Back problem    Coronary artery disease (CAD) excluded    status post cardiac CT scan with only mild plaquing and no obstructive lesions 2006status post stress echo, ejection fraction 55% able to achieved 10  METS   Dyslipidemia    History of MRSA infection    Hypertension    Hypertriglyceridemia    Intravascular papillary endothelial hyperplasia    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   Left Lower Flank   TIA (transient ischemic attack)    Past Surgical History:  Procedure Laterality Date   BACK SURGERY     C-6-7 cervical   HAND SURGERY     Intravascular papillary endothelial hyperplasia   MASS EXCISION Left 09/03/2020   Procedure: EXCISION MASS PROXIMAL PHALANX LEFT RING FINGER;  Surgeon: Cindee Salt, MD;  Location: [facility];  Service: Orthopedics;  Laterality: Left;   Patient Active Problem List   Diagnosis Date Noted   Chest pain 07/10/2014   Essential hypertension 07/10/2014   History of MRSA infection    Dyslipidemia    Hypertriglyceridemia    Coronary artery disease (CAD) excluded    ALLERGIC RHINITIS CAUSE UNSPECIFIED 07/12/2009   ASTHMA UNSPECIFIED WITH EXACERBATION 07/12/2009   TEMPOROMANDIBULAR JOINT DISORDER 07/12/2009   Prostatitis 07/12/2009   CELLULITIS/ABSCESS, LEG 07/12/2009   CELLULITIS/ABSCESS, HAND 07/08/2009   HYPERTENSION, UNSPECIFIED  02/26/2009    PCP: Deatra James, MD  REFERRING PROVIDER: [phone] (ICD-10-CM) - Hip pain, right   REFERRING DIAG: M25.551 (ICD-10-CM) - Hip pain, right   THERAPY DIAG:  Pain in right hip  Muscle weakness (generalized)  Rationale for Evaluation and Treatment: Rehabilitation  ONSET DATE: 3 months  SUBJECTIVE:   SUBJECTIVE STATEMENT: Pt presents to PT with reports of roughly 3 month hx of initially R groin but now anterior/lateral hip pain. Denies overt MOI, also denies N/T down R LE  or LBP. Was having his L wrist worked on and thinks this improved ROM changed his golf swing to put more torque on his R hip. Pain is not very severe, but he has stopped his core workouts and golfing right now per MD request.   PERTINENT HISTORY: Cervical fusion, HTN  PAIN:  Are you having pain?  Yes: NPRS scale: 2/10 Worst: 4/10 Pain location: right a Pain description: tight, sore Aggravating factors: side lying, golfing  Relieving factors: medication, heat  PRECAUTIONS: None  RED FLAGS: None   WEIGHT BEARING RESTRICTIONS: No  FALLS:  Has patient fallen in last 6 months? No  LIVING ENVIRONMENT: Lives with: lives with their family Lives in: House/apartment  OCCUPATION: Social research officer, government  PLOF: Independent  PATIENT GOALS: pt would like to decrease his R hip pain in order to comfortably get back to golf and other desired recreational activity  NEXT MD VISIT: PRN  OBJECTIVE:  Note: Objective measures were completed at Evaluation unless otherwise noted.  DIAGNOSTIC FINDINGS:   See imaging   PATIENT SURVEYS:  LEFS: 75/80  COGNITION: Overall cognitive status: Within functional limits for tasks assessed     SENSATION: WFL  EDEMA:  DNT  MUSCLE LENGTH: Hamstrings: Right WFL; Left WFL Thomas test: Right (+); Left (-)   POSTURE: rounded shoulders and forward head  PALPATION: Slight TTP to R PSIS   LOWER EXTREMITY ROM:  Active ROM Right eval Left eval   Hip flexion 108 130  Hip extension    Hip abduction    Hip adduction    Hip internal rotation    Hip external rotation    Knee flexion    Knee extension    Ankle dorsiflexion    Ankle plantarflexion    Ankle inversion    Ankle eversion     (Blank rows = not tested)  LOWER EXTREMITY MMT:  MMT Right eval Left eval  Hip flexion 5/5 5/5  Hip extension    Hip abduction 4/5 5/5  Hip adduction    Hip internal rotation    Hip external rotation    Knee flexion    Knee extension    Ankle dorsiflexion    Ankle plantarflexion    Ankle inversion    Ankle eversion     (Blank rows = not tested)  LOWER EXTREMITY SPECIAL TESTS:  Hip special tests: Luisa Hart (FABER) test: negative and Thomas test: positive   FUNCTIONAL TESTS:  5 times sit to stand: 11 seconds  GAIT: Distance walked: 36ft Assistive device utilized: None Level of assistance: Complete Independence Comments: no overt deviations                                                                                             TODAY'S TREATMENT: [facility] Adult PT Treatment:                                                  Therapeutic Exercise: Thomas stretch x 60" R Bridge with GTB x 10 S/L clamshell x 15 GTB Tennis ball trigger point release R hip  PATIENT EDUCATION:  Education details: eval findings, LEFS, HEP, POC Person educated: Patient Education method: Explanation, Demonstration, and Handouts Education comprehension: verbalized understanding and returned demonstration  HOME EXERCISE PROGRAM: Access Code: [email or web address] URL: [email or web address] Date: 07/20/2023 Prepared by: Edwinna Areola  Exercises - Hip Flexor Stretch at [facility]  - 1 x daily - 7 x weekly - 2-3 reps - 60 sec hold - Supine Bridge with Resistance Band  - 1 x daily - 7 x weekly - 3 sets - 10 reps - gtb hold - Clamshell with Resistance  - 1 x daily - 7 x weekly - 2-3 sets - 15 reps - gtb hold  ASSESSMENT:  CLINICAL  IMPRESSION: Patient is a 68 y.o. M who was seen today for physical therapy evaluation and treatment for acute on chronic  R hip pain. Physical findings consistent with MD impression and subjective timeline as pt demonstrates R hip flexor tightness and very slight decrease in proximal hip strength, most likely secondary to pain. LEFS score shows very mild impairments in performance of home ADLs and community activities. Pt would benefit from skilled PT services working on improving muscle length and strength.   OBJECTIVE IMPAIRMENTS: Abnormal gait, decreased activity tolerance, decreased balance, decreased endurance, decreased mobility, difficulty walking, decreased ROM, decreased strength, and pain  ACTIVITY LIMITATIONS: locomotion level  PARTICIPATION LIMITATIONS: community activity and golfing  PERSONAL FACTORS: 1-2 comorbidities: HTN and cervical fusion  are also affecting patient's functional outcome.   REHAB POTENTIAL: Excellent  CLINICAL DECISION MAKING: Stable/uncomplicated  EVALUATION COMPLEXITY: Low   GOALS: Goals reviewed with patient? No  SHORT TERM GOALS: Target date: 08/10/2023   Pt will be compliant and knowledgeable with initial HEP for improved comfort and carryover Baseline: initial HEP given  Goal status: INITIAL  2.  Pt will self report right hip pain no greater than 2/10 for improved comfort and functional ability Baseline: 4/10 at worst Goal status: INITIAL   LONG TERM GOALS: Target date: 08/31/2023   Pt will improve LEFS to no less than 80/80 as proxy for functional improvement with home ADLs and higher level community activity Baseline: 75/80 Goal status: INITIAL  2.  Pt will self report right hip pain no greater than 0/10 for improved comfort and functional ability Baseline: 4/10 at worst Goal status: INITIAL   3.  Pt will improve R hip abd MMT to no less than 5/5 for improved functional hip strength and decrease in pain Baseline: 4/5 Goal status:  INITIAL  4.  Pt will be able to perform golf swing without increase in R hip pain in order to return to desired recreational activity Baseline: unable Goal status: INITIAL   PLAN:  PT FREQUENCY: 2x/week  PT DURATION: 6 weeks  PLANNED INTERVENTIONS: 97164- PT Re-evaluation, 97110-Therapeutic exercises, 97530- Therapeutic activity, 97112- Neuromuscular re-education, 97535- Self Care, 82956- Manual therapy, [id number]- Gait training, 97014- Electrical stimulation (unattended), [id number]- Electrical stimulation (manual), Dry Needling, Cryotherapy, and Moist heat  PLAN FOR NEXT SESSION: assess HEP response, hip flexor stretching, proximal hip strengthening    Eloy End, PT 07/21/2023, 8:16 AM

## 2023-07-27 ENCOUNTER — Ambulatory Visit: Payer: Medicare Other

## 2023-07-27 DIAGNOSIS — M6281 Muscle weakness (generalized): Secondary | ICD-10-CM

## 2023-07-27 DIAGNOSIS — M25551 Pain in right hip: Secondary | ICD-10-CM | POA: Diagnosis not present

## 2023-07-27 NOTE — Therapy (Signed)
OUTPATIENT PHYSICAL THERAPY TREATMENT   Patient Name: [name] [name] MRN: [id number] DOB:August 10, 1955, 68 y.o., male Today's Date: 07/28/2023  END OF SESSION:  PT End of Session - 07/27/23 1524     Visit Number 2    Number of Visits 12    Date for PT Re-Evaluation 08/31/23    Authorization Type [employer] Medicare    PT Start Time 1530    PT Stop Time 1610    PT Time Calculation (min) 40 min    Activity Tolerance Patient tolerated treatment well    Behavior During Therapy WFL for tasks assessed/performed              Past Medical History:  Diagnosis Date   Back problem    Coronary artery disease (CAD) excluded    status post cardiac CT scan with only mild plaquing and no obstructive lesions 2006status post stress echo, ejection fraction 55% able to achieved 10  METS   Dyslipidemia    History of MRSA infection    Hypertension    Hypertriglyceridemia    Intravascular papillary endothelial hyperplasia    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   Left Lower Flank   TIA (transient ischemic attack)    Past Surgical History:  Procedure Laterality Date   BACK SURGERY     C-6-7 cervical   HAND SURGERY     Intravascular papillary endothelial hyperplasia   MASS EXCISION Left 09/03/2020   Procedure: EXCISION MASS PROXIMAL PHALANX LEFT RING FINGER;  Surgeon: Cindee Salt, MD;  Location: [facility];  Service: Orthopedics;  Laterality: Left;   Patient Active Problem List   Diagnosis Date Noted   Chest pain 07/10/2014   Essential hypertension 07/10/2014   History of MRSA infection    Dyslipidemia    Hypertriglyceridemia    Coronary artery disease (CAD) excluded    ALLERGIC RHINITIS CAUSE UNSPECIFIED 07/12/2009   ASTHMA UNSPECIFIED WITH EXACERBATION 07/12/2009   TEMPOROMANDIBULAR JOINT DISORDER 07/12/2009   Prostatitis 07/12/2009   CELLULITIS/ABSCESS, LEG 07/12/2009   CELLULITIS/ABSCESS, HAND 07/08/2009   HYPERTENSION, UNSPECIFIED 02/26/2009     PCP: Deatra James, MD  REFERRING PROVIDER: [phone] (ICD-10-CM) - Hip pain, right   REFERRING DIAG: M25.551 (ICD-10-CM) - Hip pain, right   THERAPY DIAG:  Pain in right hip  Muscle weakness (generalized)  Rationale for Evaluation and Treatment: Rehabilitation  ONSET DATE: 3 months  SUBJECTIVE:   SUBJECTIVE STATEMENT: Pt presents to PT with reports of no current pain. Has been compliant with HEP.   EVAL: Pt presents to PT with reports of roughly 3 month hx of initially R groin but now anterior/lateral hip pain. Denies overt MOI, also denies N/T down R LE  or LBP. Was having his L wrist worked on and thinks this improved ROM changed his golf swing to put more torque on his R hip. Pain is not very severe, but he has stopped his core workouts and golfing right now per MD request.   PERTINENT HISTORY: Cervical fusion, HTN  PAIN:  Are you having pain?  Yes: NPRS scale: 2/10 Worst: 4/10 Pain location: right a Pain description: tight, sore Aggravating factors: side lying, golfing  Relieving factors: medication, heat  PRECAUTIONS: None  RED FLAGS: None   WEIGHT BEARING RESTRICTIONS: No  FALLS:  Has patient fallen in last 6 months? No  LIVING ENVIRONMENT: Lives with: lives with their family Lives in: House/apartment  OCCUPATION: Social research officer, government  PLOF: Independent  PATIENT GOALS: pt would like to decrease his  R hip pain in order to comfortably get back to golf and other desired recreational activity  NEXT MD VISIT: PRN  OBJECTIVE:  Note: Objective measures were completed at Evaluation unless otherwise noted.  DIAGNOSTIC FINDINGS:   See imaging   PATIENT SURVEYS:  LEFS: 75/80  COGNITION: Overall cognitive status: Within functional limits for tasks assessed     SENSATION: WFL  EDEMA:  DNT  MUSCLE LENGTH: Hamstrings: Right WFL; Left WFL Thomas test: Right (+); Left (-)   POSTURE: rounded shoulders and forward head  PALPATION: Slight  TTP to R PSIS   LOWER EXTREMITY ROM:  Active ROM Right eval Left eval  Hip flexion 108 130  Hip extension    Hip abduction    Hip adduction    Hip internal rotation    Hip external rotation    Knee flexion    Knee extension    Ankle dorsiflexion    Ankle plantarflexion    Ankle inversion    Ankle eversion     (Blank rows = not tested)  LOWER EXTREMITY MMT:  MMT Right eval Left eval  Hip flexion 5/5 5/5  Hip extension    Hip abduction 4/5 5/5  Hip adduction    Hip internal rotation    Hip external rotation    Knee flexion    Knee extension    Ankle dorsiflexion    Ankle plantarflexion    Ankle inversion    Ankle eversion     (Blank rows = not tested)  LOWER EXTREMITY SPECIAL TESTS:  Hip special tests: Luisa Hart (FABER) test: negative and Thomas test: positive   FUNCTIONAL TESTS:  5 times sit to stand: 11 seconds  GAIT: Distance walked: 29ft Assistive device utilized: None Level of assistance: Complete Independence Comments: no overt deviations                                                                                             TODAY'S TREATMENT: [facility] Adult PT Treatment:                                                  Therapeutic Exercise: NuStep lvl 6 LE only x 4 min while taking subjective Thomas stretch x 60" R Supine SLR 2x15 2#  S/L hip abd 2x10 2# Lunge hip flexor stretch 2x10 - 5 sec hold STS 2x10 15# KB  Manual Therapy: Grade III mobilization with movement lateral distraction Sidelying grade II hip ext mobilization  PATIENT EDUCATION:  Education details: HEP update Person educated: Patient Education method: Explanation, Demonstration, and Handouts Education comprehension: verbalized understanding and returned demonstration  HOME EXERCISE PROGRAM: Access Code: [email or web address] URL: [email or web address] Date: 07/28/2023 Prepared by: Edwinna Areola  Exercises - Hip Flexor Stretch at [facility] of Bed  - 1 x daily - 7 x weekly -  2-3 reps - 60 sec hold - Supine Bridge with Resistance Band  - 1 x daily - 7 x weekly - 3 sets - 10 reps - gtb hold -  Clamshell with Resistance  - 1 x daily - 7 x weekly - 2-3 sets - 15 reps - gtb hold - Sidelying Hip Abduction  - 1 x daily - 7 x weekly - 3 sets - 15 reps - Half Kneeling Hip Flexor Stretch  - 1 x daily - 7 x weekly - 2 sets - 10 reps - 5-10 sec hold  ASSESSMENT:  CLINICAL IMPRESSION: Pt was able to complete all prescribed exercises with no adverse effect. HEP updated for continued hip mobilization and strengthening. Exercises today focused on improving strength and hip mobility to decrease pain. Pt progressing well, will continue to progress as able per POC.   EVAL: Patient is a 68 y.o. M who was seen today for physical therapy evaluation and treatment for acute on chronic R hip pain. Physical findings consistent with MD impression and subjective timeline as pt demonstrates R hip flexor tightness and very slight decrease in proximal hip strength, most likely secondary to pain. LEFS score shows very mild impairments in performance of home ADLs and community activities. Pt would benefit from skilled PT services working on improving muscle length and strength.   OBJECTIVE IMPAIRMENTS: Abnormal gait, decreased activity tolerance, decreased balance, decreased endurance, decreased mobility, difficulty walking, decreased ROM, decreased strength, and pain  ACTIVITY LIMITATIONS: locomotion level  PARTICIPATION LIMITATIONS: community activity and golfing  PERSONAL FACTORS: 1-2 comorbidities: HTN and cervical fusion  are also affecting patient's functional outcome.   REHAB POTENTIAL: Excellent  CLINICAL DECISION MAKING: Stable/uncomplicated  EVALUATION COMPLEXITY: Low   GOALS: Goals reviewed with patient? No  SHORT TERM GOALS: Target date: 08/10/2023   Pt will be compliant and knowledgeable with initial HEP for improved comfort and carryover Baseline: initial HEP given  Goal  status: INITIAL  2.  Pt will self report right hip pain no greater than 2/10 for improved comfort and functional ability Baseline: 4/10 at worst Goal status: INITIAL   LONG TERM GOALS: Target date: 08/31/2023   Pt will improve LEFS to no less than 80/80 as proxy for functional improvement with home ADLs and higher level community activity Baseline: 75/80 Goal status: INITIAL  2.  Pt will self report right hip pain no greater than 0/10 for improved comfort and functional ability Baseline: 4/10 at worst Goal status: INITIAL   3.  Pt will improve R hip abd MMT to no less than 5/5 for improved functional hip strength and decrease in pain Baseline: 4/5 Goal status: INITIAL  4.  Pt will be able to perform golf swing without increase in R hip pain in order to return to desired recreational activity Baseline: unable Goal status: INITIAL   PLAN:  PT FREQUENCY: 2x/week  PT DURATION: 6 weeks  PLANNED INTERVENTIONS: 97164- PT Re-evaluation, 97110-Therapeutic exercises, 97530- Therapeutic activity, 97112- Neuromuscular re-education, 97535- Self Care, [id number]- Manual therapy, [id number]- Gait training, 97014- Electrical stimulation (unattended), [id number]- Electrical stimulation (manual), Dry Needling, Cryotherapy, and Moist heat  PLAN FOR NEXT SESSION: assess HEP response, hip flexor stretching, proximal hip strengthening    Eloy End, PT 07/28/2023, 8:15 AM

## 2023-08-03 ENCOUNTER — Ambulatory Visit: Payer: Medicare Other

## 2023-08-03 DIAGNOSIS — M25551 Pain in right hip: Secondary | ICD-10-CM

## 2023-08-03 DIAGNOSIS — M6281 Muscle weakness (generalized): Secondary | ICD-10-CM

## 2023-08-03 NOTE — Therapy (Signed)
 OUTPATIENT PHYSICAL THERAPY TREATMENT   Patient Name: [name] [name] MRN: [id number] DOB:1955-12-08, 68 y.o., male Today's Date: 08/03/2023  END OF SESSION:  PT End of Session - 08/03/23 1405     Visit Number 3    Number of Visits 12    Date for PT Re-Evaluation 08/31/23    Authorization Type [employer]HC Medicare    PT Start Time 1402    Activity Tolerance Patient tolerated treatment well    Behavior During Therapy [facility] for tasks assessed/performed               Past Medical History:  Diagnosis Date   Back problem    Coronary artery disease (CAD) excluded    status post cardiac CT scan with only mild plaquing and no obstructive lesions 2006status post stress echo, ejection fraction 55% able to achieved 10  METS   Dyslipidemia    History of MRSA infection    Hypertension    Hypertriglyceridemia    Intravascular papillary endothelial hyperplasia    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   Left Lower Flank   TIA (transient ischemic attack)    Past Surgical History:  Procedure Laterality Date   BACK SURGERY     C-6-7 cervical   HAND SURGERY     Intravascular papillary endothelial hyperplasia   MASS EXCISION Left 09/03/2020   Procedure: EXCISION MASS PROXIMAL PHALANX LEFT RING FINGER;  Surgeon: Cindee Salt, MD;  Location: [facility];  Service: Orthopedics;  Laterality: Left;   Patient Active Problem List   Diagnosis Date Noted   Chest pain 07/10/2014   Essential hypertension 07/10/2014   History of MRSA infection    Dyslipidemia    Hypertriglyceridemia    Coronary artery disease (CAD) excluded    ALLERGIC RHINITIS CAUSE UNSPECIFIED 07/12/2009   ASTHMA UNSPECIFIED WITH EXACERBATION 07/12/2009   TEMPOROMANDIBULAR JOINT DISORDER 07/12/2009   Prostatitis 07/12/2009   CELLULITIS/ABSCESS, LEG 07/12/2009   CELLULITIS/ABSCESS, HAND 07/08/2009   HYPERTENSION, UNSPECIFIED 02/26/2009    PCP: Deatra James, MD  REFERRING PROVIDER: [phone]  (ICD-10-CM) - Hip pain, right   REFERRING DIAG: M25.551 (ICD-10-CM) - Hip pain, right   THERAPY DIAG:  Pain in right hip  Muscle weakness (generalized)  Rationale for Evaluation and Treatment: Rehabilitation  ONSET DATE: 3 months  SUBJECTIVE:   SUBJECTIVE STATEMENT: Pt presents to PT with no current reports of pain. Has been compliant with HEP with no adverse effect.   EVAL: Pt presents to PT with reports of roughly 3 month hx of initially R groin but now anterior/lateral hip pain. Denies overt MOI, also denies N/T down R LE  or LBP. Was having his L wrist worked on and thinks this improved ROM changed his golf swing to put more torque on his R hip. Pain is not very severe, but he has stopped his core workouts and golfing right now per MD request.   PERTINENT HISTORY: Cervical fusion, HTN  PAIN:  Are you having pain?  Yes: NPRS scale: 0/10 Worst: 4/10 Pain location: right a Pain description: tight, sore Aggravating factors: side lying, golfing  Relieving factors: medication, heat  PRECAUTIONS: None  RED FLAGS: None   WEIGHT BEARING RESTRICTIONS: No  FALLS:  Has patient fallen in last 6 months? No  LIVING ENVIRONMENT: Lives with: lives with their family Lives in: House/apartment  OCCUPATION: Social research officer, government  PLOF: Independent  PATIENT GOALS: pt would like to decrease his R hip pain in order to comfortably get back to golf  and other desired recreational activity  NEXT MD VISIT: PRN  OBJECTIVE:  Note: Objective measures were completed at Evaluation unless otherwise noted.  DIAGNOSTIC FINDINGS:   See imaging   PATIENT SURVEYS:  LEFS: 75/80  COGNITION: Overall cognitive status: Within functional limits for tasks assessed     SENSATION: WFL  EDEMA:  DNT  MUSCLE LENGTH: Hamstrings: Right WFL; Left WFL Thomas test: Right (+); Left (-)   POSTURE: rounded shoulders and forward head  PALPATION: Slight TTP to R PSIS   LOWER EXTREMITY  ROM:  Active ROM Right eval Left eval  Hip flexion 108 130  Hip extension    Hip abduction    Hip adduction    Hip internal rotation    Hip external rotation    Knee flexion    Knee extension    Ankle dorsiflexion    Ankle plantarflexion    Ankle inversion    Ankle eversion     (Blank rows = not tested)  LOWER EXTREMITY MMT:  MMT Right eval Left eval  Hip flexion 5/5 5/5  Hip extension    Hip abduction 4/5 5/5  Hip adduction    Hip internal rotation    Hip external rotation    Knee flexion    Knee extension    Ankle dorsiflexion    Ankle plantarflexion    Ankle inversion    Ankle eversion     (Blank rows = not tested)  LOWER EXTREMITY SPECIAL TESTS:  Hip special tests: Luisa Hart (FABER) test: negative and Thomas test: positive   FUNCTIONAL TESTS:  5 times sit to stand: 11 seconds  GAIT: Distance walked: 44ft Assistive device utilized: None Level of assistance: Complete Independence Comments: no overt deviations                                                                                             TODAY'S TREATMENT: [facility] Adult PT Treatment:                                                  Therapeutic Exercise: NuStep lvl 6 LE only x 4 min while taking subjective Thomas stretch x 60" R Supine hip abd 2x15 2.5#  Bridge with blue band 2x15 Supine fig 4 stretch 2x30" each Fig 4 bridge 2x10 Prone hip ext with knee bent 2x10 each Lunge hip flexor stretch 2x45" Manual Therapy: LAD grade III for R hip mobilization  PATIENT EDUCATION:  Education details: HEP update Person educated: Patient Education method: Explanation, Demonstration, and Handouts Education comprehension: verbalized understanding and returned demonstration  HOME EXERCISE PROGRAM: Access Code: [email or web address] URL: [email or web address] Date: 07/28/2023 Prepared by: Edwinna Areola  Exercises - Hip Flexor Stretch at [facility] of Bed  - 1 x daily - 7 x weekly - 2-3 reps - 60 sec  hold - Supine Bridge with Resistance Band  - 1 x daily - 7 x weekly - 3 sets - 10 reps - gtb hold - Clamshell with Resistance  - 1  x daily - 7 x weekly - 2-3 sets - 15 reps - gtb hold - Sidelying Hip Abduction  - 1 x daily - 7 x weekly - 3 sets - 15 reps - Half Kneeling Hip Flexor Stretch  - 1 x daily - 7 x weekly - 2 sets - 10 reps - 5-10 sec hold  ASSESSMENT:  CLINICAL IMPRESSION: Pt was able to complete all prescribed exercises with no adverse effect. No changes made to HEP today. Exercises focused on improving strength and hip mobility to decrease pain. Responded well to manual interventions. Pt progressing well, will continue to progress as able per POC.   EVAL: Patient is a 68 y.o. M who was seen today for physical therapy evaluation and treatment for acute on chronic R hip pain. Physical findings consistent with MD impression and subjective timeline as pt demonstrates R hip flexor tightness and very slight decrease in proximal hip strength, most likely secondary to pain. LEFS score shows very mild impairments in performance of home ADLs and community activities. Pt would benefit from skilled PT services working on improving muscle length and strength.   OBJECTIVE IMPAIRMENTS: Abnormal gait, decreased activity tolerance, decreased balance, decreased endurance, decreased mobility, difficulty walking, decreased ROM, decreased strength, and pain  ACTIVITY LIMITATIONS: locomotion level  PARTICIPATION LIMITATIONS: community activity and golfing  PERSONAL FACTORS: 1-2 comorbidities: HTN and cervical fusion  are also affecting patient's functional outcome.   REHAB POTENTIAL: Excellent  CLINICAL DECISION MAKING: Stable/uncomplicated  EVALUATION COMPLEXITY: Low   GOALS: Goals reviewed with patient? No  SHORT TERM GOALS: Target date: 08/10/2023   Pt will be compliant and knowledgeable with initial HEP for improved comfort and carryover Baseline: initial HEP given  Goal status:  INITIAL  2.  Pt will self report right hip pain no greater than 2/10 for improved comfort and functional ability Baseline: 4/10 at worst Goal status: INITIAL   LONG TERM GOALS: Target date: 08/31/2023   Pt will improve LEFS to no less than 80/80 as proxy for functional improvement with home ADLs and higher level community activity Baseline: 75/80 Goal status: INITIAL  2.  Pt will self report right hip pain no greater than 0/10 for improved comfort and functional ability Baseline: 4/10 at worst Goal status: INITIAL   3.  Pt will improve R hip abd MMT to no less than 5/5 for improved functional hip strength and decrease in pain Baseline: 4/5 Goal status: INITIAL  4.  Pt will be able to perform golf swing without increase in R hip pain in order to return to desired recreational activity Baseline: unable Goal status: INITIAL   PLAN:  PT FREQUENCY: 2x/week  PT DURATION: 6 weeks  PLANNED INTERVENTIONS: 97164- PT Re-evaluation, 97110-Therapeutic exercises, 97530- Therapeutic activity, 97112- Neuromuscular re-education, 97535- Self Care, 75643- Manual therapy, [id number]- Gait training, 97014- Electrical stimulation (unattended), [id number]- Electrical stimulation (manual), Dry Needling, Cryotherapy, and Moist heat  PLAN FOR NEXT SESSION: assess HEP response, hip flexor stretching, proximal hip strengthening    Eloy End, PT 08/03/2023, 3:12 PM

## 2023-08-05 NOTE — Therapy (Signed)
 OUTPATIENT PHYSICAL THERAPY TREATMENT   Patient Name: [name] [name] MRN: [id number] DOB:[birth year], 68 y.o., male Today's Date: 08/06/2023   END OF SESSION:  PT End of Session - 08/06/23 0930     Visit Number 4    Number of Visits 12    Date for PT Re-Evaluation 08/31/23    Authorization Type [employer] Medicare    PT Start Time 0932    PT Stop Time 1020    PT Time Calculation (min) 48 min    Activity Tolerance Patient tolerated treatment well    Behavior During Therapy WFL for tasks assessed/performed                Past Medical History:  Diagnosis Date   Back problem    Coronary artery disease (CAD) excluded    status post cardiac CT scan with only mild plaquing and no obstructive lesions 2006status post stress echo, ejection fraction 55% able to achieved 10  METS   Dyslipidemia    History of MRSA infection    Hypertension    Hypertriglyceridemia    Intravascular papillary endothelial hyperplasia    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   Left Lower Flank   TIA (transient ischemic attack)    Past Surgical History:  Procedure Laterality Date   BACK SURGERY     C-6-7 cervical   HAND SURGERY     Intravascular papillary endothelial hyperplasia   MASS EXCISION Left 09/03/2020   Procedure: EXCISION MASS PROXIMAL PHALANX LEFT RING FINGER;  Surgeon: Cindee Salt, MD;  Location: [facility];  Service: Orthopedics;  Laterality: Left;   Patient Active Problem List   Diagnosis Date Noted   Chest pain 07/10/2014   Essential hypertension 07/10/2014   History of MRSA infection    Dyslipidemia    Hypertriglyceridemia    Coronary artery disease (CAD) excluded    ALLERGIC RHINITIS CAUSE UNSPECIFIED 07/12/2009   ASTHMA UNSPECIFIED WITH EXACERBATION 07/12/2009   TEMPOROMANDIBULAR JOINT DISORDER 07/12/2009   Prostatitis 07/12/2009   CELLULITIS/ABSCESS, LEG 07/12/2009   CELLULITIS/ABSCESS, HAND 07/08/2009   HYPERTENSION, UNSPECIFIED 02/26/2009     PCP: Deatra James, MD  REFERRING PROVIDER: Hurman Horn, MD   REFERRING DIAG: [phone] (ICD-10-CM) - Hip pain, right   THERAPY DIAG:  Pain in right hip  Muscle weakness (generalized)  Rationale for Evaluation and Treatment: Rehabilitation  ONSET DATE: 3 months   SUBJECTIVE:  SUBJECTIVE STATEMENT: Patient reports right hip is a little painful this morning. He reports that his right hip began hurting when he started swinging harder with the improve range of motion of his left wrist.   EVAL: Pt presents to PT with reports of roughly 3 month hx of initially R groin but now anterior/lateral hip pain. Denies overt MOI, also denies N/T down R LE  or LBP. Was having his L wrist worked on and thinks this improved ROM changed his golf swing to put more torque on his R hip. Pain is not very severe, but he has stopped his core workouts and golfing right now per MD request.   PERTINENT HISTORY: Cervical fusion, HTN  PAIN:  Are you having pain?  Yes: NPRS scale: 0/10 Worst: 4/10 Pain location: right a Pain description: tight, sore Aggravating factors: side lying, golfing  Relieving factors: medication, heat  PRECAUTIONS: None  RED FLAGS: None   WEIGHT BEARING RESTRICTIONS: No  FALLS:  Has patient fallen in last 6 months? No  LIVING ENVIRONMENT: Lives with: lives with their family Lives in:  House/apartment  OCCUPATION: Social research officer, government  PLOF: Independent  PATIENT GOALS: pt would like to decrease his R hip pain in order to comfortably get back to golf and other desired recreational activity   OBJECTIVE:  Note: Objective measures were completed at Evaluation unless otherwise noted. PATIENT SURVEYS:  LEFS: 75/80  MUSCLE LENGTH: Hamstrings: Right WFL; Left WFL Thomas test: Right (+); Left (-)   POSTURE: rounded shoulders and forward head  PALPATION: Slight TTP to R PSIS   LOWER EXTREMITY ROM:  Active ROM Right eval Left eval  Hip flexion 108  130  Hip extension    Hip abduction    Hip adduction    Hip internal rotation    Hip external rotation    Knee flexion    Knee extension    Ankle dorsiflexion    Ankle plantarflexion    Ankle inversion    Ankle eversion     (Blank rows = not tested)  LOWER EXTREMITY MMT:  MMT Right eval Left eval  Hip flexion 5/5 5/5  Hip extension    Hip abduction 4/5 5/5  Hip adduction    Hip internal rotation    Hip external rotation    Knee flexion    Knee extension    Ankle dorsiflexion    Ankle plantarflexion    Ankle inversion    Ankle eversion     (Blank rows = not tested)  LOWER EXTREMITY SPECIAL TESTS:  Hip special tests: Luisa Hart (FABER) test: negative and Thomas test: positive   FUNCTIONAL TESTS:  5 times sit to stand: 11 seconds  GAIT: Distance walked: 66ft Assistive device utilized: None Level of assistance: Complete Independence Comments: no overt deviations                                                                                              TODAY'S TREATMENT: [facility] Adult PT Treatment:                                                DATE: 08/06/2023 NuStep L7 x 3 min with UE/LE to improve endurance and workload capacity Clamshell with blue 3 x 15 Bridge with blue at knees 3 x 10 Lateral band walk with blue at ankle 3 x 20 down/back Education provided on possible etiology of pain being from TFL dysfunction related to changing golf swing with increase in left wrist pain  Trigger Point Dry Needling  Initial Treatment: Pt instructed on Dry Needling rational, procedures, and possible side effects. Pt instructed to expect mild to moderate muscle soreness later in the day and/or into the next day.  Pt instructed in methods to reduce muscle soreness. Pt instructed to continue prescribed HEP. Because Dry Needling was performed over or adjacent to a lung field, pt was educated on S/S of pneumothorax and to seek immediate medical attention should they occur.   Patient was educated on signs and symptoms of infection and other risk factors and advised to seek medical attention should  they occur.  Patient verbalized understanding of these instructions and education.   Patient Verbal Consent Given: Yes Education Handout Provided: No patient declined Muscles Treated: Right TFL Electrical Stimulation Performed: No Treatment Response/Outcome: Twitch response  PATIENT EDUCATION:  Education details: HEP, TPDN Person educated: Patient Education method: Explanation, Demonstration, and Handouts Education comprehension: verbalized understanding and returned demonstration  HOME EXERCISE PROGRAM: Access Code: [email or web address]   ASSESSMENT: CLINICAL IMPRESSION: Patient tolerated therapy well with no adverse effects. He continues to report anterior lateral hip pain with tenderness noted over right TFL this visit so performed TPDN with good therapeutic benefit. Therapy continued focus on gluteal strengthening with progression of band resistance. No increase in pain noted with therapy. No changes made to HEP this visit. Patient would benefit from continued skilled PT to progress his hip strength and mobility in order to reduce pain and maximize functional ability.  EVAL: Patient is a 68 y.o. M who was seen today for physical therapy evaluation and treatment for acute on chronic R hip pain. Physical findings consistent with MD impression and subjective timeline as pt demonstrates R hip flexor tightness and very slight decrease in proximal hip strength, most likely secondary to pain. LEFS score shows very mild impairments in performance of home ADLs and community activities. Pt would benefit from skilled PT services working on improving muscle length and strength.   OBJECTIVE IMPAIRMENTS: Abnormal gait, decreased activity tolerance, decreased balance, decreased endurance, decreased mobility, difficulty walking, decreased ROM, decreased strength, and pain  ACTIVITY  LIMITATIONS: locomotion level  PARTICIPATION LIMITATIONS: community activity and golfing  PERSONAL FACTORS: 1-2 comorbidities: HTN and cervical fusion  are also affecting patient's functional outcome.    GOALS: Goals reviewed with patient? No  SHORT TERM GOALS: Target date: 08/10/2023   Pt will be compliant and knowledgeable with initial HEP for improved comfort and carryover Baseline: initial HEP given  Goal status: INITIAL  2.  Pt will self report right hip pain no greater than 2/10 for improved comfort and functional ability Baseline: 4/10 at worst Goal status: INITIAL   LONG TERM GOALS: Target date: 08/31/2023   Pt will improve LEFS to no less than 80/80 as proxy for functional improvement with home ADLs and higher level community activity Baseline: 75/80 Goal status: INITIAL  2.  Pt will self report right hip pain no greater than 0/10 for improved comfort and functional ability Baseline: 4/10 at worst Goal status: INITIAL   3.  Pt will improve R hip abd MMT to no less than 5/5 for improved functional hip strength and decrease in pain Baseline: 4/5 Goal status: INITIAL  4.  Pt will be able to perform golf swing without increase in R hip pain in order to return to desired recreational activity Baseline: unable Goal status: INITIAL   PLAN: PT FREQUENCY: 2x/week  PT DURATION: 6 weeks  PLANNED INTERVENTIONS: 97164- PT Re-evaluation, 97110-Therapeutic exercises, 97530- Therapeutic activity, 97112- Neuromuscular re-education, 97535- Self Care, 54098- Manual therapy, [id number]- Gait training, 97014- Electrical stimulation (unattended), [id number]- Electrical stimulation (manual), Dry Needling, Cryotherapy, and Moist heat  PLAN FOR NEXT SESSION: assess HEP response, hip flexor stretching, proximal hip strengthening    Rosana Hoes, PT, DPT, LAT, ATC 08/06/23  10:44 AM Phone: [phone] Fax: [phone]

## 2023-08-06 ENCOUNTER — Ambulatory Visit: Payer: Medicare Other | Admitting: Physical Therapy

## 2023-08-06 ENCOUNTER — Other Ambulatory Visit: Payer: Self-pay

## 2023-08-06 ENCOUNTER — Encounter: Payer: Self-pay | Admitting: Physical Therapy

## 2023-08-06 DIAGNOSIS — M6281 Muscle weakness (generalized): Secondary | ICD-10-CM | POA: Diagnosis not present

## 2023-08-06 DIAGNOSIS — M25551 Pain in right hip: Secondary | ICD-10-CM | POA: Diagnosis not present

## 2023-08-10 ENCOUNTER — Ambulatory Visit: Payer: Medicare Other | Attending: Sports Medicine | Admitting: Physical Therapy

## 2023-08-10 ENCOUNTER — Other Ambulatory Visit: Payer: Self-pay

## 2023-08-10 ENCOUNTER — Encounter: Payer: Self-pay | Admitting: Physical Therapy

## 2023-08-10 DIAGNOSIS — M6281 Muscle weakness (generalized): Secondary | ICD-10-CM | POA: Insufficient documentation

## 2023-08-10 DIAGNOSIS — M25551 Pain in right hip: Secondary | ICD-10-CM | POA: Insufficient documentation

## 2023-08-10 NOTE — Therapy (Signed)
 OUTPATIENT PHYSICAL THERAPY TREATMENT   Patient Name: [name] [name] MRN: [id number] DOB:05-26-1956, 68 y.o., male Today's Date: 08/10/2023   END OF SESSION:  PT End of Session - 08/10/23 1446     Visit Number 5    Number of Visits 12    Date for PT Re-Evaluation 08/31/23    Authorization Type [employer] Medicare    Progress Note Due on Visit 10    PT Start Time 1400    PT Stop Time 1445    PT Time Calculation (min) 45 min    Activity Tolerance Patient tolerated treatment well    Behavior During Therapy WFL for tasks assessed/performed                 Past Medical History:  Diagnosis Date   Back problem    Coronary artery disease (CAD) excluded    status post cardiac CT scan with only mild plaquing and no obstructive lesions 2006status post stress echo, ejection fraction 55% able to achieved 10  METS   Dyslipidemia    History of MRSA infection    Hypertension    Hypertriglyceridemia    Intravascular papillary endothelial hyperplasia    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   Left Lower Flank   TIA (transient ischemic attack)    Past Surgical History:  Procedure Laterality Date   BACK SURGERY     C-6-7 cervical   HAND SURGERY     Intravascular papillary endothelial hyperplasia   MASS EXCISION Left 09/03/2020   Procedure: EXCISION MASS PROXIMAL PHALANX LEFT RING FINGER;  Surgeon: Cindee Salt, MD;  Location: [facility];  Service: Orthopedics;  Laterality: Left;   Patient Active Problem List   Diagnosis Date Noted   Chest pain 07/10/2014   Essential hypertension 07/10/2014   History of MRSA infection    Dyslipidemia    Hypertriglyceridemia    Coronary artery disease (CAD) excluded    ALLERGIC RHINITIS CAUSE UNSPECIFIED 07/12/2009   ASTHMA UNSPECIFIED WITH EXACERBATION 07/12/2009   TEMPOROMANDIBULAR JOINT DISORDER 07/12/2009   Prostatitis 07/12/2009   CELLULITIS/ABSCESS, LEG 07/12/2009   CELLULITIS/ABSCESS, HAND 07/08/2009    HYPERTENSION, UNSPECIFIED 02/26/2009    PCP: Deatra James, MD  REFERRING PROVIDER: Hurman Horn, MD   REFERRING DIAG: [phone] (ICD-10-CM) - Hip pain, right   THERAPY DIAG:  Pain in right hip  Muscle weakness (generalized)  Rationale for Evaluation and Treatment: Rehabilitation  ONSET DATE: 3 months   SUBJECTIVE:  SUBJECTIVE STATEMENT: Patient reports the needling helped out, and he was able to walk without any discomfort. He states that that he does still feel some mild pain and can tell it is there, but feels like it has gotten dramatically gotten better.   EVAL: Pt presents to PT with reports of roughly 3 month hx of initially R groin but now anterior/lateral hip pain. Denies overt MOI, also denies N/T down R LE  or LBP. Was having his L wrist worked on and thinks this improved ROM changed his golf swing to put more torque on his R hip. Pain is not very severe, but he has stopped his core workouts and golfing right now per MD request.   PERTINENT HISTORY: Cervical fusion, HTN  PAIN:  Are you having pain?  Yes: NPRS scale: 0/10 Worst: 2/10 Pain location: right hip Pain description: tight, sore Aggravating factors: side lying, golfing  Relieving factors: medication, heat  PRECAUTIONS: None  RED FLAGS: None   WEIGHT BEARING RESTRICTIONS: No  FALLS:  Has  patient fallen in last 6 months? No  LIVING ENVIRONMENT: Lives with: lives with their family Lives in: House/apartment  OCCUPATION: Social research officer, government  PLOF: Independent  PATIENT GOALS: pt would like to decrease his R hip pain in order to comfortably get back to golf and other desired recreational activity   OBJECTIVE:  Note: Objective measures were completed at Evaluation unless otherwise noted. PATIENT SURVEYS:  LEFS: 75/80  MUSCLE LENGTH: Hamstrings: Right WFL; Left WFL Thomas test: Right (+); Left (-)   POSTURE: rounded shoulders and forward head  PALPATION: Slight TTP to R PSIS    LOWER EXTREMITY ROM:  Active ROM Right eval Left eval  Hip flexion 108 130  Hip extension    Hip abduction    Hip adduction    Hip internal rotation    Hip external rotation    Knee flexion    Knee extension    Ankle dorsiflexion    Ankle plantarflexion    Ankle inversion    Ankle eversion     (Blank rows = not tested)  LOWER EXTREMITY MMT:  MMT Right eval Left eval  Hip flexion 5/5 5/5  Hip extension    Hip abduction 4/5 5/5  Hip adduction    Hip internal rotation    Hip external rotation    Knee flexion    Knee extension    Ankle dorsiflexion    Ankle plantarflexion    Ankle inversion    Ankle eversion     (Blank rows = not tested)  LOWER EXTREMITY SPECIAL TESTS:  Hip special tests: Luisa Hart (FABER) test: negative and Thomas test: positive   FUNCTIONAL TESTS:  5 times sit to stand: 11 seconds  GAIT: Distance walked: 6ft Assistive device utilized: None Level of assistance: Complete Independence Comments: no overt deviations                                                                                              TODAY'S TREATMENT: [facility] Adult PT Treatment:                                                DATE: 08/10/2023 Sidelying thoracic rotation x 10 Figure-4 bridge 2 x 10 each Modified side plank with clamshell 5 x 10 sec each  Continued education on TPDN mechanism and TFL dysfunction, progressing back to previous med ball core exercise program and elliptical, gradual progression back into golf  Trigger Point Dry Needling  Subsequent Treatment: Instructions provided previously at initial dry needling treatment.  Instructions reviewed, if requested by the patient, prior to subsequent dry needling treatment.   Patient Verbal Consent Given: Yes Education Handout Provided: No patient declined Muscles Treated: Right TFL Electrical Stimulation Performed: No Treatment Response/Outcome: Twitch response   PATIENT EDUCATION:  Education details: HEP  update, TPDN Person educated: Patient Education method: Explanation, Demonstration, and Handouts Education comprehension: verbalized understanding and returned demonstration  HOME EXERCISE PROGRAM: Access Code: [email or web address]   ASSESSMENT: CLINICAL IMPRESSION: Patient tolerated therapy well  with no adverse effects. Continued with TPDN for the right TFL as this was beneficial for him following last visit. Therapy continued to focus on progressing hip and core strengthening with good tolerance, and incorporated thoracic mobility. He was able to progress with his exercises this visit without increase in pain. Education continues to be provided for rationale for treatment and progression of prior exercise program at home. Updated HEP to progress core and hip strengthening and incorporate thoracic mobility. Patient would benefit from continued skilled PT to progress his hip strength and mobility in order to reduce pain and maximize functional ability.   EVAL: Patient is a 68 y.o. M who was seen today for physical therapy evaluation and treatment for acute on chronic R hip pain. Physical findings consistent with MD impression and subjective timeline as pt demonstrates R hip flexor tightness and very slight decrease in proximal hip strength, most likely secondary to pain. LEFS score shows very mild impairments in performance of home ADLs and community activities. Pt would benefit from skilled PT services working on improving muscle length and strength.   OBJECTIVE IMPAIRMENTS: Abnormal gait, decreased activity tolerance, decreased balance, decreased endurance, decreased mobility, difficulty walking, decreased ROM, decreased strength, and pain  ACTIVITY LIMITATIONS: locomotion level  PARTICIPATION LIMITATIONS: community activity and golfing  PERSONAL FACTORS: 1-2 comorbidities: HTN and cervical fusion  are also affecting patient's functional outcome.    GOALS: Goals reviewed with patient? No  SHORT  TERM GOALS: Target date: 08/10/2023   Pt will be compliant and knowledgeable with initial HEP for improved comfort and carryover Baseline: initial HEP given  08/10/2023: independent with initial HEP Goal status: MET  2.  Pt will self report right hip pain no greater than 2/10 for improved comfort and functional ability Baseline: 4/10 at worst 08/10/2023: patient reports improvement in pain Goal status: MET  LONG TERM GOALS: Target date: 08/31/2023   Pt will improve LEFS to no less than 80/80 as proxy for functional improvement with home ADLs and higher level community activity Baseline: 75/80 Goal status: INITIAL  2.  Pt will self report right hip pain no greater than 0/10 for improved comfort and functional ability Baseline: 4/10 at worst Goal status: INITIAL   3.  Pt will improve R hip abd MMT to no less than 5/5 for improved functional hip strength and decrease in pain Baseline: 4/5 Goal status: INITIAL  4.  Pt will be able to perform golf swing without increase in R hip pain in order to return to desired recreational activity Baseline: unable Goal status: INITIAL   PLAN: PT FREQUENCY: 2x/week  PT DURATION: 6 weeks  PLANNED INTERVENTIONS: 97164- PT Re-evaluation, 97110-Therapeutic exercises, 97530- Therapeutic activity, 97112- Neuromuscular re-education, 97535- Self Care, 16109- Manual therapy, [id number]- Gait training, 97014- Electrical stimulation (unattended), [id number]- Electrical stimulation (manual), Dry Needling, Cryotherapy, and Moist heat  PLAN FOR NEXT SESSION: assess HEP response, hip flexor stretching, proximal hip strengthening    Rosana Hoes, PT, DPT, LAT, ATC 08/10/23  2:56 PM Phone: [phone] Fax: [phone]

## 2023-08-10 NOTE — Patient Instructions (Signed)
 Access Code: [email or web address] URL: [email or web address] Date: 08/10/2023 Prepared by: Rosana Hoes  Exercises - Hip Flexor Stretch at [facility] of Bed  - 1 x daily - 7 x weekly - 2-3 reps - 60 sec hold - Supine Bridge with Resistance Band  - 1 x daily - 7 x weekly - 3 sets - 10 reps - gtb hold - Clamshell with Resistance  - 1 x daily - 7 x weekly - 2-3 sets - 15 reps - Sidelying Hip Abduction  - 1 x daily - 7 x weekly - 3 sets - 15 reps - Half Kneeling Hip Flexor Stretch  - 1 x daily - 7 x weekly - 2 sets - 10 reps - 5-10 sec hold - Sidelying Thoracic Rotation  - 1 x daily - 10 reps - 5 seconds hold - Figure 4 Bridge  - 1 x daily - 3 sets - 10 reps - Side Plank with Clam and Resistance  - 1 x daily - 3 sets - 5 reps - 10 seconds hold

## 2023-08-11 DIAGNOSIS — H401211 Low-tension glaucoma, right eye, mild stage: Secondary | ICD-10-CM | POA: Diagnosis not present

## 2023-08-13 ENCOUNTER — Ambulatory Visit: Payer: Medicare Other | Admitting: Physical Therapy

## 2023-08-13 ENCOUNTER — Encounter: Payer: Self-pay | Admitting: Physical Therapy

## 2023-08-13 ENCOUNTER — Other Ambulatory Visit: Payer: Self-pay

## 2023-08-13 DIAGNOSIS — M25551 Pain in right hip: Secondary | ICD-10-CM

## 2023-08-13 DIAGNOSIS — M6281 Muscle weakness (generalized): Secondary | ICD-10-CM

## 2023-08-13 NOTE — Patient Instructions (Signed)
 Access Code: [email or web address] URL: [email or web address] Date: 08/13/2023 Prepared by: Rosana Hoes  Exercises - Sidelying Thoracic Rotation  - 1 x daily - 10 reps - 5 seconds hold - Half Kneeling Hip Flexor Stretch  - 1 x daily - 3 reps - 20-30 seconds hold - Figure 4 Bridge  - 1 x daily - 2 sets - 10 reps - 3 seconds hold - Side Plank with Clam and Resistance  - 1 x daily - 3 sets - 5 reps - 5 seconds hold - Side Stepping with Resistance at Ankles  - 1 x daily - 3 sets - 20 reps - Standing Anti-Rotation Press with Anchored Resistance  - 1 x daily - 2 sets - 10 reps - 3 seconds hold

## 2023-08-13 NOTE — Therapy (Signed)
 OUTPATIENT PHYSICAL THERAPY TREATMENT   Patient Name: [name] [name] MRN: [id number] DOB:1956-06-04, 68 y.o., male Today's Date: 08/13/2023   END OF SESSION:  PT End of Session - 08/13/23 0939     Visit Number 6    Number of Visits 12    Date for PT Re-Evaluation 08/31/23    Authorization Type [employer] Medicare    Authorization Time Period 07/20/2023 - 08/31/2023    Authorization - Visit Number 6    Authorization - Number of Visits 8    Progress Note Due on Visit 10    PT Start Time 0930    PT Stop Time 1015    PT Time Calculation (min) 45 min    Activity Tolerance Patient tolerated treatment well    Behavior During Therapy [facility] for tasks assessed/performed                  Past Medical History:  Diagnosis Date   Back problem    Coronary artery disease (CAD) excluded    status post cardiac CT scan with only mild plaquing and no obstructive lesions 2006status post stress echo, ejection fraction 55% able to achieved 10  METS   Dyslipidemia    History of MRSA infection    Hypertension    Hypertriglyceridemia    Intravascular papillary endothelial hyperplasia    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   Left Lower Flank   TIA (transient ischemic attack)    Past Surgical History:  Procedure Laterality Date   BACK SURGERY     C-6-7 cervical   HAND SURGERY     Intravascular papillary endothelial hyperplasia   MASS EXCISION Left 09/03/2020   Procedure: EXCISION MASS PROXIMAL PHALANX LEFT RING FINGER;  Surgeon: Cindee Salt, MD;  Location: [facility];  Service: Orthopedics;  Laterality: Left;   Patient Active Problem List   Diagnosis Date Noted   Chest pain 07/10/2014   Essential hypertension 07/10/2014   History of MRSA infection    Dyslipidemia    Hypertriglyceridemia    Coronary artery disease (CAD) excluded    ALLERGIC RHINITIS CAUSE UNSPECIFIED 07/12/2009   ASTHMA UNSPECIFIED WITH EXACERBATION 07/12/2009   TEMPOROMANDIBULAR JOINT  DISORDER 07/12/2009   Prostatitis 07/12/2009   CELLULITIS/ABSCESS, LEG 07/12/2009   CELLULITIS/ABSCESS, HAND 07/08/2009   HYPERTENSION, UNSPECIFIED 02/26/2009    PCP: Deatra James, MD  REFERRING PROVIDER: Hurman Horn, MD   REFERRING DIAG: [phone] (ICD-10-CM) - Hip pain, right   THERAPY DIAG:  Pain in right hip  Muscle weakness (generalized)  Rationale for Evaluation and Treatment: Rehabilitation  ONSET DATE: 3 months   SUBJECTIVE:  SUBJECTIVE STATEMENT: Patient reports he did 22 minutes on the elliptical and light upper body exercise without any issues.   EVAL: Pt presents to PT with reports of roughly 3 month hx of initially R groin but now anterior/lateral hip pain. Denies overt MOI, also denies N/T down R LE  or LBP. Was having his L wrist worked on and thinks this improved ROM changed his golf swing to put more torque on his R hip. Pain is not very severe, but he has stopped his core workouts and golfing right now per MD request.   PERTINENT HISTORY: Cervical fusion, HTN  PAIN:  Are you having pain?  Yes: NPRS scale: 0/10 Worst: 2/10 Pain location: right hip Pain description: tight, sore Aggravating factors: side lying, golfing  Relieving factors: medication, heat  PRECAUTIONS: None  RED FLAGS: None   WEIGHT BEARING RESTRICTIONS: No  FALLS:  Has patient fallen in last 6 months? No  LIVING ENVIRONMENT: Lives with: lives with their family Lives in: House/apartment  OCCUPATION: Social research officer, government  PLOF: Independent  PATIENT GOALS: pt would like to decrease his R hip pain in order to comfortably get back to golf and other desired recreational activity   OBJECTIVE:  Note: Objective measures were completed at Evaluation unless otherwise noted. PATIENT SURVEYS:  LEFS: 75/80  MUSCLE LENGTH: Hamstrings: Right WFL; Left WFL Thomas test: Right (+); Left (-)   POSTURE: rounded shoulders and forward head  PALPATION: Slight TTP to R PSIS    LOWER EXTREMITY ROM:  Active ROM Right eval Left eval  Hip flexion 108 130  Hip extension    Hip abduction    Hip adduction    Hip internal rotation    Hip external rotation    Knee flexion    Knee extension    Ankle dorsiflexion    Ankle plantarflexion    Ankle inversion    Ankle eversion     (Blank rows = not tested)  LOWER EXTREMITY MMT:  MMT Right eval Left eval  Hip flexion 5/5 5/5  Hip extension    Hip abduction 4/5 5/5  Hip adduction    Hip internal rotation    Hip external rotation    Knee flexion    Knee extension    Ankle dorsiflexion    Ankle plantarflexion    Ankle inversion    Ankle eversion     (Blank rows = not tested)  LOWER EXTREMITY SPECIAL TESTS:  Hip special tests: Luisa Hart (FABER) test: negative and Thomas test: positive   FUNCTIONAL TESTS:  5 times sit to stand: 11 seconds  GAIT: Distance walked: 16ft Assistive device utilized: None Level of assistance: Complete Independence Comments: no overt deviations                                                                                              TODAY'S TREATMENT: [facility] Adult PT Treatment:                                                DATE: 08/13/2023 Sidelying thoracic rotation 10 x 5 sec each 1/2 kneeling hip flexor stretch 3 x 20 sec each Figure-4 bridge 2 x 10 each Modified side plank with clamshell 2 x 5 x 5 sec each Lateral band walk with blue at ankles 2 x 20 down/back Pallof press with black 2 x 10 x 3 sec each   PATIENT EDUCATION:  Education details: HEP update Person educated: Patient Education method: Explanation, Demonstration, and Handouts Education comprehension: verbalized understanding and returned demonstration  HOME EXERCISE PROGRAM: Access Code: [email or web address]   ASSESSMENT: CLINICAL IMPRESSION: Patient tolerated therapy well with no adverse effects. He continues to report great improvement in his right hip symptoms and therapy focused on progressing core and  hip strengthening and consolidating his HEP. He was able to progress with banded core stabilization and hip strengthening exercises  and did not report any pain with therapy. Occasional cueing provided for exercise technique and muscular activation. Updated HEP to consolidate exercises and progress hip and core strengthening. Patient would benefit from continued skilled PT to progress his hip strength and mobility in order to reduce pain and maximize functional ability.   EVAL: Patient is a 68 y.o. M who was seen today for physical therapy evaluation and treatment for acute on chronic R hip pain. Physical findings consistent with MD impression and subjective timeline as pt demonstrates R hip flexor tightness and very slight decrease in proximal hip strength, most likely secondary to pain. LEFS score shows very mild impairments in performance of home ADLs and community activities. Pt would benefit from skilled PT services working on improving muscle length and strength.   OBJECTIVE IMPAIRMENTS: Abnormal gait, decreased activity tolerance, decreased balance, decreased endurance, decreased mobility, difficulty walking, decreased ROM, decreased strength, and pain  ACTIVITY LIMITATIONS: locomotion level  PARTICIPATION LIMITATIONS: community activity and golfing  PERSONAL FACTORS: 1-2 comorbidities: HTN and cervical fusion  are also affecting patient's functional outcome.    GOALS: Goals reviewed with patient? No  SHORT TERM GOALS: Target date: 08/10/2023   Pt will be compliant and knowledgeable with initial HEP for improved comfort and carryover Baseline: initial HEP given  08/10/2023: independent with initial HEP Goal status: MET  2.  Pt will self report right hip pain no greater than 2/10 for improved comfort and functional ability Baseline: 4/10 at worst 08/10/2023: patient reports improvement in pain Goal status: MET  LONG TERM GOALS: Target date: 08/31/2023   Pt will improve LEFS to no less  than 80/80 as proxy for functional improvement with home ADLs and higher level community activity Baseline: 75/80 Goal status: INITIAL  2.  Pt will self report right hip pain no greater than 0/10 for improved comfort and functional ability Baseline: 4/10 at worst Goal status: INITIAL   3.  Pt will improve R hip abd MMT to no less than 5/5 for improved functional hip strength and decrease in pain Baseline: 4/5 Goal status: INITIAL  4.  Pt will be able to perform golf swing without increase in R hip pain in order to return to desired recreational activity Baseline: unable Goal status: INITIAL   PLAN: PT FREQUENCY: 2x/week  PT DURATION: 6 weeks  PLANNED INTERVENTIONS: 97164- PT Re-evaluation, 97110-Therapeutic exercises, 97530- Therapeutic activity, 97112- Neuromuscular re-education, 97535- Self Care, [id number]- Manual therapy, [id number]- Gait training, 97014- Electrical stimulation (unattended), [id number]- Electrical stimulation (manual), Dry Needling, Cryotherapy, and Moist heat  PLAN FOR NEXT SESSION: assess HEP response, hip flexor stretching, proximal hip strengthening    Rosana Hoes, PT, DPT, LAT, ATC 08/13/23  10:24 AM Phone: [phone] Fax: [phone]

## 2023-08-16 ENCOUNTER — Other Ambulatory Visit: Payer: Self-pay

## 2023-08-16 ENCOUNTER — Encounter: Payer: Self-pay | Admitting: Physical Therapy

## 2023-08-16 ENCOUNTER — Ambulatory Visit: Payer: Medicare Other | Admitting: Physical Therapy

## 2023-08-16 DIAGNOSIS — M6281 Muscle weakness (generalized): Secondary | ICD-10-CM

## 2023-08-16 DIAGNOSIS — M25551 Pain in right hip: Secondary | ICD-10-CM | POA: Diagnosis not present

## 2023-08-16 NOTE — Therapy (Signed)
 OUTPATIENT PHYSICAL THERAPY TREATMENT   Patient Name: [name] [name] MRN: [id number] DOB:18-Jan-1956, 68 y.o., male Today's Date: 08/16/2023   END OF SESSION:  PT End of Session - 08/16/23 1328     Visit Number 7    Number of Visits 12    Date for PT Re-Evaluation 08/31/23    Authorization Type [employer] Medicare    Authorization Time Period 07/20/2023 - 08/31/2023    Authorization - Visit Number 7    Authorization - Number of Visits 8    Progress Note Due on Visit 10    PT Start Time 1317    PT Stop Time 1400    PT Time Calculation (min) 43 min    Activity Tolerance Patient tolerated treatment well    Behavior During Therapy WFL for tasks assessed/performed                   Past Medical History:  Diagnosis Date   Back problem    Coronary artery disease (CAD) excluded    status post cardiac CT scan with only mild plaquing and no obstructive lesions 2006status post stress echo, ejection fraction 55% able to achieved 10  METS   Dyslipidemia    History of MRSA infection    Hypertension    Hypertriglyceridemia    Intravascular papillary endothelial hyperplasia    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   Left Lower Flank   TIA (transient ischemic attack)    Past Surgical History:  Procedure Laterality Date   BACK SURGERY     C-6-7 cervical   HAND SURGERY     Intravascular papillary endothelial hyperplasia   MASS EXCISION Left 09/03/2020   Procedure: EXCISION MASS PROXIMAL PHALANX LEFT RING FINGER;  Surgeon: Cindee Salt, MD;  Location: [facility];  Service: Orthopedics;  Laterality: Left;   Patient Active Problem List   Diagnosis Date Noted   Chest pain 07/10/2014   Essential hypertension 07/10/2014   History of MRSA infection    Dyslipidemia    Hypertriglyceridemia    Coronary artery disease (CAD) excluded    ALLERGIC RHINITIS CAUSE UNSPECIFIED 07/12/2009   ASTHMA UNSPECIFIED WITH EXACERBATION 07/12/2009   TEMPOROMANDIBULAR  JOINT DISORDER 07/12/2009   Prostatitis 07/12/2009   CELLULITIS/ABSCESS, LEG 07/12/2009   CELLULITIS/ABSCESS, HAND 07/08/2009   HYPERTENSION, UNSPECIFIED 02/26/2009    PCP: Deatra James, MD  REFERRING PROVIDER: Hurman Horn, MD   REFERRING DIAG: [phone] (ICD-10-CM) - Hip pain, right   THERAPY DIAG:  Pain in right hip  Muscle weakness (generalized)  Rationale for Evaluation and Treatment: Rehabilitation  ONSET DATE: 3 months   SUBJECTIVE:  SUBJECTIVE STATEMENT: Patient reports he still is feeling better. He did the elliptical on Friday and yesterday and he could feel it a little more. He has still not done the medicine ball routine.    EVAL: Pt presents to PT with reports of roughly 3 month hx of initially R groin but now anterior/lateral hip pain. Denies overt MOI, also denies N/T down R LE  or LBP. Was having his L wrist worked on and thinks this improved ROM changed his golf swing to put more torque on his R hip. Pain is not very severe, but he has stopped his core workouts and golfing right now per MD request.   PERTINENT HISTORY: Cervical fusion, HTN  PAIN:  Are you having pain?  Yes: NPRS scale: 0/10 Worst: 2/10 Pain location: right hip Pain description: tight, sore Aggravating factors: side lying, golfing  Relieving  factors: medication, heat  PRECAUTIONS: None  RED FLAGS: None   WEIGHT BEARING RESTRICTIONS: No  FALLS:  Has patient fallen in last 6 months? No  LIVING ENVIRONMENT: Lives with: lives with their family Lives in: House/apartment  OCCUPATION: Social research officer, government  PLOF: Independent  PATIENT GOALS: pt would like to decrease his R hip pain in order to comfortably get back to golf and other desired recreational activity   OBJECTIVE:  Note: Objective measures were completed at Evaluation unless otherwise noted. PATIENT SURVEYS:  LEFS: 75/80  MUSCLE LENGTH: Hamstrings: Right WFL; Left WFL Thomas test: Right (+); Left (-)    POSTURE: rounded shoulders and forward head  PALPATION: Slight TTP to R PSIS   LOWER EXTREMITY ROM:  Active ROM Right eval Left eval  Hip flexion 108 130  Hip extension    Hip abduction    Hip adduction    Hip internal rotation    Hip external rotation    Knee flexion    Knee extension    Ankle dorsiflexion    Ankle plantarflexion    Ankle inversion    Ankle eversion     (Blank rows = not tested)  LOWER EXTREMITY MMT:  MMT Right eval Left eval  Hip flexion 5/5 5/5  Hip extension    Hip abduction 4/5 5/5  Hip adduction    Hip internal rotation    Hip external rotation    Knee flexion    Knee extension    Ankle dorsiflexion    Ankle plantarflexion    Ankle inversion    Ankle eversion     (Blank rows = not tested)  LOWER EXTREMITY SPECIAL TESTS:  Hip special tests: Luisa Hart (FABER) test: negative and Thomas test: positive   FUNCTIONAL TESTS:  5 times sit to stand: 11 seconds  GAIT: Distance walked: 53ft Assistive device utilized: None Level of assistance: Complete Independence Comments: no overt deviations                                                                                              TODAY'S TREATMENT: [facility] Adult PT Treatment:                                                DATE: 08/16/2023 Sidelying thoracic rotation 10 x 5 sec each LTR in figure-4 position x 5 each 90-90 alternating heel tap x 10, add stability ball x 10, with leg extension x 10 Bird dog 2 x 10 Deadlift with 45# KB 3 x 8 Hip airplane with bilat UE support x 10 Cable trunk rotation/chop with bar attachment 17# 2 x 8  PATIENT EDUCATION:  Education details: HEP Person educated: Patient Education method: Programmer, multimedia, Demonstration Education comprehension: verbalized understanding and returned demonstration  HOME EXERCISE PROGRAM: Access Code: [email or web address]   ASSESSMENT: CLINICAL IMPRESSION: Patient tolerated therapy well with no adverse effects. He did not demonstrate  any tenderness or palpable trigger points of the right TFL this visit so deferred TPDN this visit.  Therapy continues to focus on progressing core and hip strengthening with good tolerance. He was able to progress lifting and standing more functional core exercises for hip and trunk rotation that simulates golf movements. No increase in pain reported this visit. No changes made to HEP this visit. Patient would benefit from continued skilled PT to progress his hip strength and mobility in order to reduce pain and maximize functional ability.   EVAL: Patient is a 68 y.o. M who was seen today for physical therapy evaluation and treatment for acute on chronic R hip pain. Physical findings consistent with MD impression and subjective timeline as pt demonstrates R hip flexor tightness and very slight decrease in proximal hip strength, most likely secondary to pain. LEFS score shows very mild impairments in performance of home ADLs and community activities. Pt would benefit from skilled PT services working on improving muscle length and strength.   OBJECTIVE IMPAIRMENTS: Abnormal gait, decreased activity tolerance, decreased balance, decreased endurance, decreased mobility, difficulty walking, decreased ROM, decreased strength, and pain  ACTIVITY LIMITATIONS: locomotion level  PARTICIPATION LIMITATIONS: community activity and golfing  PERSONAL FACTORS: 1-2 comorbidities: HTN and cervical fusion  are also affecting patient's functional outcome.    GOALS: Goals reviewed with patient? No  SHORT TERM GOALS: Target date: 08/10/2023   Pt will be compliant and knowledgeable with initial HEP for improved comfort and carryover Baseline: initial HEP given  08/10/2023: independent with initial HEP Goal status: MET  2.  Pt will self report right hip pain no greater than 2/10 for improved comfort and functional ability Baseline: 4/10 at worst 08/10/2023: patient reports improvement in pain Goal status: MET  LONG  TERM GOALS: Target date: 08/31/2023   Pt will improve LEFS to no less than 80/80 as proxy for functional improvement with home ADLs and higher level community activity Baseline: 75/80 Goal status: INITIAL  2.  Pt will self report right hip pain no greater than 0/10 for improved comfort and functional ability Baseline: 4/10 at worst Goal status: INITIAL   3.  Pt will improve R hip abd MMT to no less than 5/5 for improved functional hip strength and decrease in pain Baseline: 4/5 Goal status: INITIAL  4.  Pt will be able to perform golf swing without increase in R hip pain in order to return to desired recreational activity Baseline: unable Goal status: INITIAL   PLAN: PT FREQUENCY: 2x/week  PT DURATION: 6 weeks  PLANNED INTERVENTIONS: 97164- PT Re-evaluation, 97110-Therapeutic exercises, 97530- Therapeutic activity, 97112- Neuromuscular re-education, 97535- Self Care, 60109- Manual therapy, [id number]- Gait training, 97014- Electrical stimulation (unattended), [id number]- Electrical stimulation (manual), Dry Needling, Cryotherapy, and Moist heat  PLAN FOR NEXT SESSION: assess HEP response, proximal hip and core strengthening    Rosana Hoes, PT, DPT, LAT, ATC 08/16/23  2:06 PM Phone: [phone] Fax: [phone]

## 2023-08-17 DIAGNOSIS — M25551 Pain in right hip: Secondary | ICD-10-CM | POA: Diagnosis not present

## 2023-08-23 ENCOUNTER — Encounter: Admitting: Physical Therapy

## 2023-08-23 DIAGNOSIS — R051 Acute cough: Secondary | ICD-10-CM | POA: Diagnosis not present

## 2023-08-23 DIAGNOSIS — B349 Viral infection, unspecified: Secondary | ICD-10-CM | POA: Diagnosis not present

## 2023-08-23 DIAGNOSIS — J40 Bronchitis, not specified as acute or chronic: Secondary | ICD-10-CM | POA: Diagnosis not present

## 2023-08-30 ENCOUNTER — Ambulatory Visit: Admitting: Physical Therapy

## 2023-08-30 ENCOUNTER — Other Ambulatory Visit: Payer: Self-pay

## 2023-08-30 ENCOUNTER — Encounter: Payer: Self-pay | Admitting: Physical Therapy

## 2023-08-30 DIAGNOSIS — M25551 Pain in right hip: Secondary | ICD-10-CM | POA: Diagnosis not present

## 2023-08-30 DIAGNOSIS — M6281 Muscle weakness (generalized): Secondary | ICD-10-CM | POA: Diagnosis not present

## 2023-08-30 NOTE — Therapy (Signed)
 OUTPATIENT PHYSICAL THERAPY TREATMENT  DISCHARGE   Patient Name: [name] [name] MRN: [id number] DOB:1955-12-27, 68 y.o., male Today's Date: 08/30/2023   END OF SESSION:  PT End of Session - 08/30/23 1531     Visit Number 8    Number of Visits 12    Date for PT Re-Evaluation 08/31/23    Authorization Type [employer]    Authorization Time Period 07/20/2023 - 08/31/2023    Authorization - Visit Number 8    Authorization - Number of Visits 8    PT Start Time 1448    PT Stop Time 1526    PT Time Calculation (min) 38 min    Activity Tolerance Patient tolerated treatment well    Behavior During Therapy WFL for tasks assessed/performed                    Past Medical History:  Diagnosis Date   Back problem    Coronary artery disease (CAD) excluded    status post cardiac CT scan with only mild plaquing and no obstructive lesions 2006status post stress echo, ejection fraction 55% able to achieved 10  METS   Dyslipidemia    History of MRSA infection    Hypertension    Hypertriglyceridemia    Intravascular papillary endothelial hyperplasia    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   Left Lower Flank   TIA (transient ischemic attack)    Past Surgical History:  Procedure Laterality Date   BACK SURGERY     C-6-7 cervical   HAND SURGERY     Intravascular papillary endothelial hyperplasia   MASS EXCISION Left 09/03/2020   Procedure: EXCISION MASS PROXIMAL PHALANX LEFT RING FINGER;  Surgeon: Cindee Salt, MD;  [facility];  Service: Orthopedics;  Laterality: Left;   Patient Active Problem List   Diagnosis Date Noted   Chest pain 07/10/2014   Essential hypertension 07/10/2014   History of MRSA infection    Dyslipidemia    Hypertriglyceridemia    Coronary artery disease (CAD) excluded    ALLERGIC RHINITIS CAUSE UNSPECIFIED 07/12/2009   ASTHMA UNSPECIFIED WITH EXACERBATION 07/12/2009   TEMPOROMANDIBULAR JOINT DISORDER  07/12/2009   Prostatitis 07/12/2009   CELLULITIS/ABSCESS, LEG 07/12/2009   CELLULITIS/ABSCESS, HAND 07/08/2009   HYPERTENSION, UNSPECIFIED 02/26/2009    PCP: Deatra James, MD  REFERRING PROVIDER: Hurman Horn, MD   REFERRING DIAG: [phone] (ICD-10-CM) - Hip pain, right   THERAPY DIAG:  Pain in right hip  Muscle weakness (generalized)  Rationale for Evaluation and Treatment: Rehabilitation  ONSET DATE: 3 months   SUBJECTIVE:  SUBJECTIVE STATEMENT: Patient reports after last visit he got a cortisone injection in the left anterior hip, the doctor told him not to swing a club for two weeks. He has done the elliptical but he got the flu and that limited him with some of his other exercises. He feels like he is ready to be done with therapy and is consistent with all his exercises at home.   EVAL: Pt presents to PT with reports of roughly 3 month hx of initially R groin but now anterior/lateral hip pain. Denies overt MOI, also denies N/T down R LE  or LBP. Was having his L wrist worked on and thinks this improved ROM changed his golf swing to put more torque on his R hip. Pain is not very severe, but he has stopped his core workouts and golfing right now per MD request.   PERTINENT HISTORY: Cervical fusion, HTN  PAIN:  Are you having pain?  Yes: NPRS scale: 0/10 Pain location: right hip Pain description: tight, sore Aggravating factors: side lying, golfing  Relieving factors: medication, heat  PRECAUTIONS: None  RED FLAGS: None   WEIGHT BEARING RESTRICTIONS: No  FALLS:  Has patient fallen in last 6 months? No  LIVING ENVIRONMENT: Lives with: lives with their family Lives in: House/apartment  OCCUPATION: Social research officer, government  PLOF: Independent  PATIENT GOALS: pt would like to decrease his R hip pain in order to comfortably get back to golf and other desired recreational activity   OBJECTIVE:  Note: Objective measures were completed at Evaluation unless  otherwise noted. PATIENT SURVEYS:  LEFS: 75/80  08/30/2023: 78/80  MUSCLE LENGTH: Hamstrings: Right WFL; Left WFL Thomas test: Right (+); Left (-)   POSTURE: rounded shoulders and forward head  PALPATION: Slight TTP to R PSIS   LOWER EXTREMITY ROM:  Active ROM Right eval Left eval  Hip flexion 108 130  Hip extension    Hip abduction    Hip adduction    Hip internal rotation    Hip external rotation    Knee flexion    Knee extension    Ankle dorsiflexion    Ankle plantarflexion    Ankle inversion    Ankle eversion     (Blank rows = not tested)  LOWER EXTREMITY MMT:  MMT Right eval Left eval Rt / Lt 08/30/2023  Hip flexion 5/5 5/5 5 / 5  Hip extension   5 / 5  Hip abduction 4/5 5/5 5 / 5  Hip adduction     Hip internal rotation     Hip external rotation     Knee flexion     Knee extension     Ankle dorsiflexion     Ankle plantarflexion     Ankle inversion     Ankle eversion      (Blank rows = not tested)  LOWER EXTREMITY SPECIAL TESTS:  Hip special tests: Luisa Hart (FABER) test: negative and Thomas test: positive   FUNCTIONAL TESTS:  5 times sit to stand: 11 seconds  GAIT: Distance walked: 104ft Assistive device utilized: None Level of assistance: Complete Independence Comments: no overt deviations                                                                                              TODAY'S TREATMENT: [facility] Adult PT Treatment:                                                DATE: 08/30/2023 Sidelying thoracic rotation 10 x 5 sec each Figure-4 bridge x 10 each Modified side plank with clamshell using blue x 10 each Pallof press with skinny power band x 10 each Lateral band walk with blue at ankle x 20 down/back 1/2 kneeling hip flexor stretch x 30 sec each Advised patient on gradual progression back to golf   PATIENT EDUCATION:  Education details: POC discharge, HEP Person educated: Patient Education  method: Explanation,  Demonstration Education comprehension: verbalized understanding and returned demonstration  HOME EXERCISE PROGRAM: Access Code: [email or web address]   ASSESSMENT: CLINICAL IMPRESSION: Patient tolerated therapy well with no adverse effects. He reports that the lateral hip pain has resolved and he has not swung a golf club per doctor request but is planning to go to the driving range on Wednesday. He demonstrates much improved hip and core strength and is consistent with his exercise routine at home. No pain reported with therapy this visit. Advised patient on gradual return to golf. Patient will be formally discharged from PT at this time.   EVAL: Patient is a 68 y.o. M who was seen today for physical therapy evaluation and treatment for acute on chronic R hip pain. Physical findings consistent with MD impression and subjective timeline as pt demonstrates R hip flexor tightness and very slight decrease in proximal hip strength, most likely secondary to pain. LEFS score shows very mild impairments in performance of home ADLs and community activities. Pt would benefit from skilled PT services working on improving muscle length and strength.   OBJECTIVE IMPAIRMENTS: Abnormal gait, decreased activity tolerance, decreased balance, decreased endurance, decreased mobility, difficulty walking, decreased ROM, decreased strength, and pain  ACTIVITY LIMITATIONS: locomotion level  PARTICIPATION LIMITATIONS: community activity and golfing  PERSONAL FACTORS: 1-2 comorbidities: HTN and cervical fusion  are also affecting patient's functional outcome.    GOALS: Goals reviewed with patient? No  SHORT TERM GOALS: Target date: 08/10/2023   Pt will be compliant and knowledgeable with initial HEP for improved comfort and carryover Baseline: initial HEP given  08/10/2023: independent with initial HEP Goal status: MET  2.  Pt will self report right hip pain no greater than 2/10 for improved comfort and functional  ability Baseline: 4/10 at worst 08/10/2023: patient reports improvement in pain Goal status: MET  LONG TERM GOALS: Target date: 08/31/2023   Pt will improve LEFS to no less than 80/80 as proxy for functional improvement with home ADLs and higher level community activity Baseline: 75/80 08/30/2023: 78/80 Goal status: PARTIALLY MET  2.  Pt will self report right hip pain no greater than 0/10 for improved comfort and functional ability Baseline: 4/10 at worst 08/30/2023: 0/10 Goal status: MET  3.  Pt will improve R hip abd MMT to no less than 5/5 for improved functional hip strength and decrease in pain Baseline: 4/5 08/30/2023: 5/5 Goal status: MET  4.  Pt will be able to perform golf swing without increase in R hip pain in order to return to desired recreational activity Baseline: unable 08/30/2023: has not attempted per doctor request Goal status: NOT MET   PLAN: PT FREQUENCY: 2x/week  PT DURATION: 6 weeks  PLANNED INTERVENTIONS: 97164- PT Re-evaluation, 97110-Therapeutic exercises, 97530- Therapeutic activity, 97112- Neuromuscular re-education, 97535- Self Care, 11914- Manual therapy, 97116- Gait training, 97014- Electrical stimulation (unattended), [id number]- Electrical stimulation (manual), Dry Needling, Cryotherapy, and Moist heat  PLAN FOR NEXT SESSION: NA - discharge    Rosana Hoes, PT, DPT, LAT, ATC 08/30/23  3:37 PM Phone: [phone] Fax: [phone]   PHYSICAL THERAPY DISCHARGE SUMMARY  Visits from Start of Care: 8  Current functional level related to goals / functional outcomes: See above   Remaining deficits: See above   Education / Equipment: HEP   Patient agrees to discharge. Patient goals were partially met. Patient is being discharged due to being pleased with the current functional level.

## 2023-08-31 ENCOUNTER — Telehealth: Payer: Self-pay | Admitting: Cardiovascular Disease

## 2023-08-31 DIAGNOSIS — Z79899 Other long term (current) drug therapy: Secondary | ICD-10-CM

## 2023-08-31 DIAGNOSIS — E785 Hyperlipidemia, unspecified: Secondary | ICD-10-CM

## 2023-08-31 NOTE — Telephone Encounter (Signed)
 New Message:      Patient says he need a lab order put in this week, so he can have lab work before his appointment next week. He said please add to his order PSA, and TSH.He said there is also a test that he does not know the name of it. Its is a test that you get lab work for when you turn 65.

## 2023-08-31 NOTE — Telephone Encounter (Signed)
 Patient identification verified by 2 forms. Shade Flood, RN     Called and spoke to patient  Patient states:  - patient normally gets tsh, psa, cbc, lipids, lpa at annual visit. TSH and LPA ordered at last OV.               Interventions/Plan: - CBC, LIPIDS AND LPA ordered    Reviewed ED warning signs/precautions  Patient agrees with plan, no questions at this time

## 2023-09-06 DIAGNOSIS — Z79899 Other long term (current) drug therapy: Secondary | ICD-10-CM | POA: Diagnosis not present

## 2023-09-06 DIAGNOSIS — E785 Hyperlipidemia, unspecified: Secondary | ICD-10-CM | POA: Diagnosis not present

## 2023-09-06 LAB — CBC

## 2023-09-07 LAB — CBC
Hematocrit: 40 % (ref 37.5–51.0)
Hemoglobin: 13.2 g/dL (ref 13.0–17.7)
MCH: 30.6 pg (ref 26.6–33.0)
MCHC: 33 g/dL (ref 31.5–35.7)
MCV: 93 fL (ref 79–97)
Platelets: 271 10*3/uL (ref 150–450)
RBC: 4.32 x10E6/uL (ref 4.14–5.80)
RDW: 13.5 % (ref 11.6–15.4)
WBC: 8.8 10*3/uL (ref 3.4–10.8)

## 2023-09-07 LAB — LIPID PANEL
Chol/HDL Ratio: 2.6 ratio (ref 0.0–5.0)
Cholesterol, Total: 137 mg/dL (ref 100–199)
HDL: 52 mg/dL (ref >39)
LDL Chol Calc (NIH): 64 mg/dL (ref 0–99)
Triglycerides: 114 mg/dL (ref 0–149)
VLDL Cholesterol Cal: 21 mg/dL (ref 5–40)

## 2023-09-07 LAB — LIPOPROTEIN A (LPA)

## 2023-09-08 ENCOUNTER — Other Ambulatory Visit (HOSPITAL_BASED_OUTPATIENT_CLINIC_OR_DEPARTMENT_OTHER): Payer: Self-pay | Admitting: Sports Medicine

## 2023-09-08 ENCOUNTER — Ambulatory Visit: Payer: Medicare Other | Attending: Cardiovascular Disease | Admitting: Cardiovascular Disease

## 2023-09-08 DIAGNOSIS — I2510 Atherosclerotic heart disease of native coronary artery without angina pectoris: Secondary | ICD-10-CM | POA: Diagnosis not present

## 2023-09-08 DIAGNOSIS — I10 Essential (primary) hypertension: Secondary | ICD-10-CM

## 2023-09-08 DIAGNOSIS — N419 Inflammatory disease of prostate, unspecified: Secondary | ICD-10-CM | POA: Diagnosis not present

## 2023-09-08 DIAGNOSIS — M25551 Pain in right hip: Secondary | ICD-10-CM

## 2023-09-08 DIAGNOSIS — E785 Hyperlipidemia, unspecified: Secondary | ICD-10-CM

## 2023-09-08 DIAGNOSIS — Z79899 Other long term (current) drug therapy: Secondary | ICD-10-CM

## 2023-09-08 NOTE — Progress Notes (Unsigned)
 Patient ID: [name] [name], male   DOB: Nov 15, 1955, 68 y.o.   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     [name] [name] in the past has been followed by [name] [name] and later [name] [name].  I first saw [name] [name] on 10/27/2012 when he presented to establish cardiology care with me and expressed his interest in aggressive preventive cardiology assessment. He has a strong family history for heart disease in both parents as well as grandparents. He has a history of hypertension and hyperlipidemia.  Remotely when followed by [name] [name] he had undergone several myocardial perfusion studies.  In 2006 and 2007 he also underwent several [facility] heart lab evaluations. In September 2006 his cholesterol was 188, LDL 90, HDL 40 but his triglycerides were 292. He is LDL III a + b. percent was significantly increased at 50.5. HDL2b  percent was low at 11. He has been on statin plus zetia for lipid lowering therapy. In 2008 he began to be followed by Dr. [name]. A chest CT revealed a calcium score 194. He did have mild coronary obstructive disease with less than 50% calcific lesion noted in the mid LAD and less than 30% calcific disease in the proximal and mid RCA.  Subsequently, he has undergone an exercise stress echo study in November 2011 which was normal. The patient has remained asymptomatic and most of his studies were screening evaluations for CAD. Since Dr. [name] has left his practice in [place], [name] [name] has selected me to pursue his aggressive preventive cardiology evaluation.  [name] [name] remains very physically active. He exercises regularly and works out with a trainer at least 2 days per week. This past year he skied both at [place], [place] and [place], [place] without cardiovascular problems. He also does a ladder protocol on the elliptical trainer he has a 7 handicap in golf. He was seen in the emergency room at  [facility] for some symptoms of chest sensation in October 2013.   When I initially saw him, I recommended a complete set of laboratory including an NMR lipoprotein of with lipid panel. In addition I also recommended that he undergo a cardiopulmonary met test to assess for not only macrovascular angina but potential microvascular ischemia or endothelial dysfunction findings. Cardiopulmonary met test was done on 11/21/2012. This revealed a slightly reduced peak maximum oxygen consumption 80% of predicted he did have a low peak stroke volume. His anaerobic threshold was in the normal range at 13.7 mL's per kilogram per minute. Abnormal pulmonary status. Laboratory revealed a fasting glucose of 105. LDL cholesterol is excellent at 61 with an LDL particle number 01/13/1996 however, his triglycerides were still elevated at 325 and increased large VLDL of 9.5 His LDL particles were 631. Had a normal HDL of 46 and normal HDL particle number 39.4. His insulin resistance score was increased at 57.  On 03/28/2013, laboratory revealed a fasting glucose of 102. Bilirubin was 1.3. He had normal SGOT and SGPT. LDL particle number was excellent at 824, LDL 63, triglycerides were markedly improved but still minimally elevated at 169. Large LDL was also mildly elevated at 6.4 as was small LDL particle number at 654. HDL cholesterol was 47. Insulin resistance score was somewhat improved but again still mildly elevated at 52.  Since I last saw him in May 2015, he has continued to be fairly active.  However, recently  he admits to experiencing 4-5 episodes of a chest pressure sensation.  This was not always precipitated by activity.  He had gone skiing and was able to ski but did note some sharp twinges of chest discomfort.  Because of his significant family history for premature coronary artery disease and with significant disease in all 4 grandparents, father and mother, he was concerned about these episodes of chest  pain and presents for evaluation.  A follow-up NMR lipoprotein on 07/05/2014 showed a total cholesterol of 187, triglycerides were mildly elevated at 173.  HDL cholesterol is excellent at 59 and LDL cholesterol was 93.  He had 566 small LDL particles in his LDL particle number was 1211.  Insulin resistance score was slightly elevated at 59, which had increased from 46 9 months previously.  In early 2016 he had complained of some very mild episodes of vague chest sensation.  Remotely, a chest CT revealed a calcium score 194 and he had mild coronary obstructive disease.  With his  chest pain development I recommended that he undergo an exercise Myoview study.  This was done on 07/24/2014 and revealed normal perfusion without scar or ischemia.  Post stress ejection fraction was 64%.  He uunderwent follow-up lipid panel on 10/30/2014.  This was improved with a total cholesterol 151, triglycerides 152, HDL 51, LDL 70.  When I saw him last year one year ago he was exercising anywhere from 5-7 days per week , typically does cardio alternating with weights.  He had markedly reduced his intake.  At least one meal per day as a salad with protein.  I saw him in November 2018 at which time he stated that he was doing well and remained stable and active.   He denied any episodes of chest pressure or palpitations.  He stated that his blood pressure was stable on  amlodipine/benazepril 5/10 , and atenolol 25 mg daily. Laboratory showed a fasting glucose of 104.  His CBC was normal. Lipid studies were improved with a total cholesterol 146, triglycerides 97, HDL 61, VLDL 19, and LDL cholesterol 66.  He is on atorvastatin 40 mg and Zetia 10 mg in addition to omega-3 fatty acid.  TSH was normal and there was  minimal increase in ALT at 47.  He has significantly adjusted his diet which resulted in marked improvement in his previous significant triglyceride elevation.  In that evaluation I also discussed the reduce it trial with  the CPAP.  He also was on Jalyn for prostate issues.  I saw him in December 2019. Over the prior year, he had continued to do well; however,  he had developed a significant episode of low back discomfort and during this time he could not exercise for approximately 4 weeks.  Subsequently, he  resumed his working out with a trainer on Mondays and Thursdays and does cardiac work at least 3 days/week with an elliptical at home. He eats salads 4 days/week typically with fish for protein. He ate more over the Thanksgiving break including foods with more sodium.  He states his blood pressure typically has been running in the  115 to -128 range with diastolics 75-80.  I saw him, his blood pressure was elevated and the night prior to seeing him he had eaten pizza with sausage and Canadian bacon.  At that time, I again reviewed the new hypertensive guidelines and suggested further titration of his benazepril to 20 mg since he had been on amlodipine/benazepril combination of 5/10.  Lipid studies  had slightly increased with an LDL up to 88.  Since I saw him, he apparently was evaluated by Raquel her pharmacist in the office setting and on telephone.  His medications were adjusted and he now is on an increased dose of amlodipine at 10 mg and is no longer on ACE inhibition.  He continues to be on atenolol 25 mg tablet for which he takes a quarter of a tablet daily.  He is on atorvastatin at 40 mg and apparently has been cutting his Zetia 10 mg pill in half to 5 mg.  He continues to be on dutasteride and tamsulosin for prostate issues.  When I saw him in June 2020,  he was continuing to be active.  He was  working with a Psychologist, educational 2 days/week at a trainers house since gyms are closed and on Tuesday Friday and Saturdays he does elliptical work.  He often plays golf.  He denied chest pain,PND, orthopnea or palpitations.   I saw him in January 2021.  At that time he was more cognizant of sodium in his diet.  Presently, he is on  amlodipine 10 mg daily, atenolol 6.25 mg.  At his last office visit, he was breaking his Zetia in half and I recommended resuming 10 mg daily.  He continued to be on atorvastatin one half of an 80 mg tablet.  He continues to be on Jalyn for prostate issues.  He also takes over-the-counter omega-3 fatty acid.  He had undergone repeat laboratory on June 12, 2019 which was excellent.  Specifically, total cholesterol was improved at 134, LDL 63 triglycerides 91 HDL 54.  Chemistry CBC PSA and TSH were all normal.    I saw him on August 01, 2020.  Over the prior year he continued to do well.    He underwent an echo Doppler study in May 2021 which showed an EF of 60 to 65%, mild concentric LVH and grade 1 diastolic dysfunction.  He had normal pulmonary pressures.  There was very mild mitral regurgitation and aortic insufficiency.  He states his blood pressure readings at home are around 120/70 with pulse in the mid 50s.  He sees Dr. Wynelle Link of [facility] and Dr. Annabell Howells of urology.  He apparently was diagnosed with a cyst on his left ring finger which may need excision.  Apparently it was advised that he see me prior to his evaluation since I had not seen him in over a year and he presents for evaluation.  He continues to be stable without chest pain PND orthopnea.    When I saw him on Oct 29, 2021  he remained active.    He admits to some weight gain and has a target goal down to 160.  His blood pressure has been stable and he believes 90% of the time his blood pressure is around 128/74.  However at other times it increases to 145/50.  He has been taking amlodipine/benazepril 5/10 mg daily and atenolol 12.5 mg daily.  He is on atorvastatin 40 mg and Zetia 10 mg for hyperlipidemia.  He had undergone laboratory on May 22.  CBC was stable.  Comprehensive metabolic panel was stable although glucose was 109.  Lipids were excellent with total cholesterol 122, triglycerides 77, HDL 53 and LDL 54.  PSA was normal at 0.9.  He would  like to try decreasing his atorvastatin.  During that evaluation, his LDL was 54.  Then reduce atorvastatin from 40 mg down to 20 mg I suggested he  alternate 20 and 40 mg every other day and continue taking his Zetia.  I last saw him on September 14, 2022 at which time he remained stable.   He denies chest pain or shortness of breath.  He does the elliptical that he will 2-3 times per week.  He remains active.  His blood pressure at home has been ranging around 1 20-1 25 over low 70s with heart rates in the mid 50s.  He had recent laboratory in September 09, 2022.  LP(a) was normal at less than 8.4.  Glucose 103.  Potassium 4.2.  Renal function and LFTs were stable.  Total cholesterol was 135.  LDL was now 61.  He would like to have his PSA and TSH checked.    Over the past year, he has continued to do well.  He remains active and exercises using a stairmaster, elliptical, his blood pressure at home has been stable in the 118 - 128/76 range.  He often takes Friday off and works consistently for 3 straight weeks and then may have time off for a week.  He continues to be on a regimen of amlodipine/benazepril 5/10 mg daily, atenolol 12.5 mg, and he has a prescription for hydralazine 10 mg to take on a as needed basis if blood pressure is elevated.  He rarely takes this.  He continues to be on atorvastatin 40 mg alternating with 20 mg every other day and Zetia 10 mg.  Most recent laboratory on September 06, 2023 showed total cholesterol 137 LDL cholesterol 64.  Triglycerides are 114 and HDL 52.  LP(a) was less than 8.4.  He presents for yearly evaluation.  Past Medical History:  Diagnosis Date   Back problem    Coronary artery disease (CAD) excluded    status post cardiac CT scan with only mild plaquing and no obstructive lesions 2006status post stress echo, ejection fraction 55% able to achieved 10  METS   Dyslipidemia    History of MRSA infection    Hypertension    Hypertriglyceridemia    Intravascular papillary  endothelial hyperplasia    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   Left Lower Flank   TIA (transient ischemic attack)     Past Surgical History:  Procedure Laterality Date   BACK SURGERY     C-6-7 cervical   HAND SURGERY     Intravascular papillary endothelial hyperplasia   MASS EXCISION Left 09/03/2020   Procedure: EXCISION MASS PROXIMAL PHALANX LEFT RING FINGER;  Surgeon: Cindee Salt, MD;  Location: [facility];  Service: Orthopedics;  Laterality: Left;    Allergies  Allergen Reactions   Bactroban [Mupirocin]    Penicillins     Current Outpatient Medications  Medication Sig Dispense Refill   amLODipine-benazepril (LOTREL) 5-10 MG capsule TAKE ONE CAPSULE BY MOUTH DAILY 90 capsule 3   aspirin 81 MG EC tablet Take 81 mg by mouth daily.       atenolol (TENORMIN) 25 MG tablet TAKE 1/2 TABLET BY MOUTH EVERY DAY 45 tablet 3   atorvastatin (LIPITOR) 40 MG tablet TAKE 1 TABLET BY MOUTH DAILY 90 tablet 1   Coenzyme Q10 (COQ10 PO) Take by mouth.     dutasteride (AVODART) 0.5 MG capsule Take 0.5 mg by mouth daily.     ezetimibe (ZETIA) 10 MG tablet TAKE 1 TABLET BY MOUTH EVERY DAY 90 tablet 3   hydrALAZINE (APRESOLINE) 10 MG tablet Take 1 tablet by mouth as needed for systolic BP > 150. 30  tablet 6   Multiple Vitamin (MULTIVITAMIN) capsule Take 1 capsule by mouth daily.       Omega-3 1400 MG CAPS Take 2 capsules by mouth 2 (two) times daily.      tamsulosin (FLOMAX) 0.4 MG CAPS capsule Take 0.4 mg by mouth daily.     No current facility-administered medications for this visit.   Facility-Administered Medications Ordered in Other Visits  Medication Dose Route Frequency Provider Last Rate Last Admin   sodium chloride flush (NS) 0.9 % injection 10 mL  10 mL Intravenous PRN Jaffe, Adam R, DO   20 mL at 10/16/19 1401   Socially, he is married for 28 years. He works in Metallurgist. He did his undergraduate degree at [employer]. 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 There is no tobacco history. He does drink alcohol socially. He remains active with athletics.  Family History  Problem Relation Age of Onset   Heart disease Other    Hypertension Other    Stroke Other    Heart attack Father    Hypertension Father    Hyperlipidemia Father    Heart attack Mother    Hypertension Mother    Hyperlipidemia Mother    ROS General: Negative; No fevers, chills, or night sweats; there is no change in weight HEENT: Negative; No changes in vision or hearing, sinus congestion, difficulty swallowing Pulmonary: He is status post recent allergy and treatment with Z-Pak.; No cough, wheezing, shortness of breath, hemoptysis Cardiovascular: Negative; No chest pain, presyncope, syncope, palpatations GI: Negative; No nausea, vomiting, diarrhea, or abdominal pain GU: Negative; No dysuria, hematuria, or difficulty voiding Musculoskeletal: An episode of low back discomfort.  Remote C6-7 anterior approach neck surgery over 20 years ago by Dr. Jeral Fruit Hematologic/Oncology: Negative; no easy bruising, bleeding Endocrine: Negative; no heat/cold intolerance; no diabetes Neuro: Negative; no changes in balance, headaches Skin: Negative; No rashes or skin lesions Psychiatric: Negative; No behavioral problems, depression Sleep: Negative; No snoring, daytime sleepiness, hypersomnolence, bruxism, restless legs, hypnogognic hallucinations, no cataplexy Other comprehensive 14 point system review is negative.   PE BP 130/80   Pulse 62   Ht 5\' 10"  (1.778 m)   Wt 170 lb 12.8 oz (77.5 kg)   SpO2 99%   BMI 24.51 kg/m    Repeat blood pressure by me was 130/70  Wt Readings from Last 3 Encounters:  09/08/23 170 lb 12.8 oz (77.5 kg)  09/14/22 176 lb 6.4 oz (80 kg)  10/29/21 174 lb 9.6 oz (79.2 kg)   General: Alert, oriented, no distress.  Skin: normal turgor, no rashes, warm and dry HEENT: Normocephalic, atraumatic. Pupils equal round  and reactive to light; sclera anicteric; extraocular muscles intact;  Nose without nasal septal hypertrophy Mouth/Parynx benign; Mallinpatti scale 2 Neck: No JVD, no carotid bruits; normal carotid upstroke Lungs: clear to ausculatation and percussion; no wheezing or rales Chest wall: without tenderness to palpitation Heart: PMI not displaced, RRR, s1 s2 normal, 1/6 systolic murmur, no diastolic murmur, no rubs, gallops, thrills, or heaves Abdomen: soft, nontender; no hepatosplenomehaly, BS+; abdominal aorta nontender and not dilated by palpation. Back: no CVA tenderness Pulses 2+ Musculoskeletal: full range of motion, normal strength, no joint deformities Extremities: no clubbing cyanosis or edema, Homan's sign negative  Neurologic: grossly nonfocal; Cranial nerves grossly wnl Psychologic: Normal mood and affect   EKG Interpretation Date/Time:  Wednesday September 08 2023 08:14:59 EDT Ventricular Rate:  62 PR Interval:  158 QRS Duration:  94  QT Interval:  400 QTC Calculation: 406 R Axis:   44  Text Interpretation: Normal sinus rhythm Minimal voltage criteria for LVH, may be normal variant ( Sokolow-Lyon ) When compared with ECG of 19-Jul-2020 08:40, No significant change was found Confirmed by Nicki Guadalajara [phone]) on 09/10/2023 9:01:07 AM    September 14, 2022 ECG (independently read by me): Sinus bradycardia at 56, LVH,    Oct 29, 2021 ECG (independently read by me): Sinus rhythm at 55, early transition  August 01, 2020 ECG (independently read by me): NSR at 70, no ectopy, normal intervals   January 2021 ECG (independently read by me): Sinus rhythm with an isolated PAC.  Early transition.  Normal intervals.  No ST segment changes  June  2020 ECG (independently read by me): Sinus bradycardia 58; Normal intervals.Early transition.  December 2019 ECG (independently read by me): Normal sinus rhythm at 68 bpm.  LVH by voltage criteria.  Normal intervals.  No ectopy.  November 2018 ECG  (independently read by me): Normal sinus rhythm at 62 bpm.  No ectopy.  Normal intervals.  September 2017 ECG (independently read by me): Normal sinus rhythm at 64 bpm.  Borderline voltage criteria for LVH.   No significant ST changes.  Normal intervals.  September 2016 ECG (independently read by me): normal sinus rhythm at 61 bpm.  Normal intervals.  No ST segment changes.  February 2016 ECG (independently read by me): Normal sinus rhythm at 72 bpm.  No significant ST segment changes.  Normal intervals.  May 2015 ECG (independently read by me) sinus bradycardia 57 beats per minute.  No ectopy.  Normal intervals.  ECG: Normal sinus rhythm at 60 with voltage criteria for LVH.  LABS:    Latest Ref Rng & Units 09/09/2022    8:29 AM 10/27/2021    9:16 AM 07/26/2020    9:32 AM  BMP  Glucose 70 - 99 mg/dL 578  469  95   BUN 8 - 27 mg/dL 12  12  13    Creatinine 0.76 - 1.27 mg/dL 6.29  5.28  4.13   BUN/Creat Ratio 10 - 24 14  14  15    Sodium 134 - 144 mmol/L 137  141  138   Potassium 3.5 - 5.2 mmol/L 4.2  4.5  4.3   Chloride 96 - 106 mmol/L 102  104  100   CO2 20 - 29 mmol/L 21  25  22    Calcium 8.6 - 10.2 mg/dL 9.0  9.5  9.5       Latest Ref Rng & Units 09/09/2022    8:29 AM 10/27/2021    9:16 AM 07/26/2020    9:32 AM  Hepatic Function  Total Protein 6.0 - 8.5 g/dL 6.8  7.1  7.2   Albumin 3.9 - 4.9 g/dL 4.4  4.8  4.7   AST 0 - 40 IU/L 26  24  26    ALT 0 - 44 IU/L 31  29  32   Alk Phosphatase 44 - 121 IU/L 86  81  77   Total Bilirubin 0.0 - 1.2 mg/dL 0.8  1.0  1.2       Latest Ref Rng & Units 09/06/2023    8:57 AM 10/27/2021    9:16 AM 07/26/2020    9:32 AM  CBC  WBC 3.4 - 10.8 x10E3/uL 8.8  7.6  5.9   Hemoglobin 13.0 - 17.7 g/dL 24.4  01.0  27.2   Hematocrit 37.5 - 51.0 % 40.0  43.5  42.7   Platelets 150 - 450 x10E3/uL 271  205  206    Lab Results  Component Value Date   MCV 93 09/06/2023   MCV 93 10/27/2021   MCV 93 07/26/2020   Lab Results  Component Value Date   TSH  1.110 03/30/2023    No results found for: "HGBA1C"   Lipid Panel     Component Value Date/Time   CHOL 137 09/06/2023 0857   CHOL 187 07/05/2014 1218   TRIG 114 09/06/2023 0857   TRIG 173 (H) 07/05/2014 1218   HDL 52 09/06/2023 0857   HDL 59 07/05/2014 1218   CHOLHDL 2.6 09/06/2023 0857   CHOLHDL 2.7 02/07/2016 1504   VLDL 36 (H) 02/07/2016 1504   LDLCALC 64 09/06/2023 0857   LDLCALC 93 07/05/2014 1218    IMPRESSION:  1. Essential hypertension   2. Coronary artery calcification seen on CT scan   3. Hyperlipidemia with target LDL less than 70   4. History of prostatitis, unspecified prostatitis type     ASSESSMENT AND PLAN:  [name]. Demaria has is a 68 year-old active gentleman who was found to have mild coronary calcification and nonobstructive stenoses demonstrated on a prior chest CT in 2008.  He has had normal myocardial perfusion studies and stress echo studies in the past. His last nuclear study in February 2016 was unchanged and showed normal perfusion without scar or ischemia.  He has had prior blood pressure elevations on his previous dose of amlodipine/benazepril 5/10 and currently his blood pressure is improved on a regimen consisting of only amlodipine 10 mg daily in addition to an atenolol 6.25 mg daily.  He continues to do exceptionally well and denies any chest pain or shortness of breath.  He continues to exercise regularly.  He is cognizant with reference to sodium intake and had made significant adjustments to his diet.  He often takes his blood pressure at home.  This past Saturday blood pressure was 128/76 and on Sunday 118/65.  His blood pressure today in our office by me was 130/70.  He continues to be on combination therapy with amlodipine/benazepril 5/10 mg, and is taking a atenolol at 12.5 mg daily.  EKG today is stable with heart rate at 62 and minimal voltage criteria for LVH.  He continues to be on atorvastatin 40 mg alternating with 20 mg and Zetia 10 mg daily.  He is  being closely monitoring his lipid studies over many years.  Laboratory on September 06, 2023 showed total cholesterol 137, triglycerides 114, HDL 52, and LDL 64.  LP(a) was normal at less than 8.4.  Clinically he is doing well.  He is aware of my upcoming retirement.  I will transition him to the care of Dr. Epifanio Lesches and arrange for evaluation in 1 year or sooner as needed.   Lennette Bihari, MD, [facility] 09/10/2023 9:05 AM

## 2023-09-08 NOTE — Patient Instructions (Signed)
 Medication Instructions:  No medication changes were made during today's visit.  *If you need a refill on your cardiac medications before your next appointment, please call your pharmacy*   Lab Work: No labs were ordered during today's visit.  If you have labs (blood work) drawn today and your tests are completely normal, you will receive your results only by: MyChart Message (if you have MyChart) OR A paper copy in the mail If you have any lab test that is abnormal or we need to change your treatment, we will call you to review the results.   Testing/Procedures: No procedures were ordered during today's visit.    Follow-Up: At [facility], you and your health needs are our priority.  As part of our continuing mission to provide you with exceptional heart care, we have created designated Provider Care Teams.  These Care Teams include your primary Cardiologist (physician) and Advanced Practice Providers (APPs -  Physician Assistants and Nurse Practitioners) who all work together to provide you with the care you need, when you need it.  We recommend signing up for the patient portal called "MyChart".  Sign up information is provided on this After Visit Summary.  MyChart is used to connect with patients for Virtual Visits (Telemedicine).  Patients are able to view lab/test results, encounter notes, upcoming appointments, etc.  Non-urgent messages can be sent to your provider as well.   To learn more about what you can do with MyChart, go to [email or web address].    Your next appointment:   1 year(s)  Provider:   Dr. Epifanio Lesches     Other Instructions [facility]  [address] [place], [place] [address] OPENING APRIL [phone]       1st Floor: - Lobby - Registration  - Pharmacy  - Lab - Cafe   2nd Floor: - PV Lab - Diagnostic Testing (echo, CT, nuclear med)   3rd Floor: - Vacant   4th Floor: - TCTS (cardiothoracic  surgery) - AFib Clinic - Structural Heart Clinic - Vascular Surgery  - Vascular Ultrasound   5th Floor: - [facility] (general and EP) - Clinical Pharmacy for coumadin, hypertension, lipid, weight-loss medications, and med management appointments      Valet parking services will be available as well.       Thank you for choosing [facility]!

## 2023-09-10 ENCOUNTER — Encounter: Payer: Self-pay | Admitting: Cardiovascular Disease

## 2023-09-17 ENCOUNTER — Ambulatory Visit (HOSPITAL_BASED_OUTPATIENT_CLINIC_OR_DEPARTMENT_OTHER)

## 2023-10-08 ENCOUNTER — Other Ambulatory Visit: Payer: Self-pay | Admitting: Cardiovascular Disease

## 2023-10-30 ENCOUNTER — Other Ambulatory Visit: Payer: Self-pay | Admitting: Cardiovascular Disease

## 2023-10-30 DIAGNOSIS — E785 Hyperlipidemia, unspecified: Secondary | ICD-10-CM

## 2023-12-09 DIAGNOSIS — M160 Bilateral primary osteoarthritis of hip: Secondary | ICD-10-CM | POA: Diagnosis not present

## 2024-01-07 ENCOUNTER — Telehealth: Payer: Self-pay | Admitting: Cardiovascular Disease

## 2024-01-07 DIAGNOSIS — E785 Hyperlipidemia, unspecified: Secondary | ICD-10-CM

## 2024-01-07 MED ORDER — EZETIMIBE 10 MG PO TABS: 10.0000 mg | ORAL_TABLET | Freq: Every day | ORAL | 2 refills | Status: AC

## 2024-01-07 MED ORDER — ATENOLOL 25 MG PO TABS: 12.5000 mg | ORAL_TABLET | Freq: Every day | ORAL | 2 refills | Status: AC

## 2024-01-07 NOTE — Telephone Encounter (Signed)
*  STAT* If patient is at the pharmacy, call can be transferred to refill team.   1. Which medications need to be refilled? (please list name of each medication and dose if known)  atenolol  (TENORMIN ) 25 MG tablet      ezetimibe  (ZETIA ) 10 MG tablet  2. Which pharmacy/location (including street and city if local pharmacy) is medication to be sent to? [facility] - [place], [place] - [address]   3. Do they need a 30 day or 90 day supply?  90 day supply  Patient says he is completely out of medication.

## 2024-01-07 NOTE — Telephone Encounter (Signed)
 RX sent to requested Pharmacy

## 2024-01-31 DIAGNOSIS — R7301 Impaired fasting glucose: Secondary | ICD-10-CM | POA: Diagnosis not present

## 2024-01-31 DIAGNOSIS — I10 Essential (primary) hypertension: Secondary | ICD-10-CM | POA: Diagnosis not present

## 2024-02-01 DIAGNOSIS — R7301 Impaired fasting glucose: Secondary | ICD-10-CM | POA: Diagnosis not present

## 2024-02-01 DIAGNOSIS — I2510 Atherosclerotic heart disease of native coronary artery without angina pectoris: Secondary | ICD-10-CM | POA: Diagnosis not present

## 2024-02-01 DIAGNOSIS — Z0000 Encounter for general adult medical examination without abnormal findings: Secondary | ICD-10-CM | POA: Diagnosis not present

## 2024-02-01 DIAGNOSIS — I10 Essential (primary) hypertension: Secondary | ICD-10-CM | POA: Diagnosis not present

## 2024-02-01 DIAGNOSIS — H40001 Preglaucoma, unspecified, right eye: Secondary | ICD-10-CM | POA: Diagnosis not present

## 2024-02-24 DIAGNOSIS — L918 Other hypertrophic disorders of the skin: Secondary | ICD-10-CM | POA: Diagnosis not present

## 2024-02-24 DIAGNOSIS — Z411 Encounter for cosmetic surgery: Secondary | ICD-10-CM | POA: Diagnosis not present

## 2024-02-24 DIAGNOSIS — L821 Other seborrheic keratosis: Secondary | ICD-10-CM | POA: Diagnosis not present

## 2024-02-24 DIAGNOSIS — D692 Other nonthrombocytopenic purpura: Secondary | ICD-10-CM | POA: Diagnosis not present

## 2024-02-24 DIAGNOSIS — D229 Melanocytic nevi, unspecified: Secondary | ICD-10-CM | POA: Diagnosis not present

## 2024-02-24 DIAGNOSIS — L570 Actinic keratosis: Secondary | ICD-10-CM | POA: Diagnosis not present

## 2024-02-24 DIAGNOSIS — L578 Other skin changes due to chronic exposure to nonionizing radiation: Secondary | ICD-10-CM | POA: Diagnosis not present

## 2024-02-24 DIAGNOSIS — L814 Other melanin hyperpigmentation: Secondary | ICD-10-CM | POA: Diagnosis not present

## 2024-03-15 DIAGNOSIS — M7521 Bicipital tendinitis, right shoulder: Secondary | ICD-10-CM | POA: Diagnosis not present

## 2024-04-11 ENCOUNTER — Other Ambulatory Visit: Payer: Self-pay | Admitting: General Practice

## 2024-04-12 MED ORDER — HYDRALAZINE HCL 10 MG PO TABS: ORAL_TABLET | ORAL | 5 refills | Status: AC

## 2024-10-06 ENCOUNTER — Ambulatory Visit: Admitting: Cardiology
